# Patient Record
Sex: Female | Born: 1975 | Race: Black or African American | Hispanic: No | Marital: Married | Smoking: Never smoker
Health system: Southern US, Community
[De-identification: ages and names within clinical notes are randomized; demographics above are authoritative.]

## PROBLEM LIST (undated history)

## (undated) DIAGNOSIS — E785 Hyperlipidemia, unspecified: Secondary | ICD-10-CM

## (undated) DIAGNOSIS — I10 Essential (primary) hypertension: Secondary | ICD-10-CM

## (undated) DIAGNOSIS — M519 Unspecified thoracic, thoracolumbar and lumbosacral intervertebral disc disorder: Secondary | ICD-10-CM

## (undated) DIAGNOSIS — R55 Syncope and collapse: Secondary | ICD-10-CM

## (undated) DIAGNOSIS — M109 Gout, unspecified: Secondary | ICD-10-CM

## (undated) DIAGNOSIS — E669 Obesity, unspecified: Secondary | ICD-10-CM

## (undated) HISTORY — PX: APPENDECTOMY: SHX54

## (undated) HISTORY — DX: Unspecified thoracic, thoracolumbar and lumbosacral intervertebral disc disorder: M51.9

## (undated) HISTORY — PX: CHOLECYSTECTOMY: SHX55

## (undated) HISTORY — DX: Hyperlipidemia, unspecified: E78.5

## (undated) HISTORY — DX: Essential (primary) hypertension: I10

## (undated) HISTORY — DX: Obesity, unspecified: E66.9

## (undated) HISTORY — DX: Syncope and collapse: R55

---

## 2001-12-30 ENCOUNTER — Encounter: Payer: Self-pay | Admitting: Family Medicine

## 2001-12-30 ENCOUNTER — Ambulatory Visit (HOSPITAL_COMMUNITY): Admission: RE | Admit: 2001-12-30 | Discharge: 2001-12-30 | Payer: Self-pay | Admitting: Family Medicine

## 2002-01-07 ENCOUNTER — Observation Stay (HOSPITAL_COMMUNITY): Admission: EM | Admit: 2002-01-07 | Discharge: 2002-01-07 | Payer: Self-pay | Admitting: Emergency Medicine

## 2002-01-07 ENCOUNTER — Encounter: Payer: Self-pay | Admitting: Internal Medicine

## 2002-01-14 ENCOUNTER — Other Ambulatory Visit: Admission: RE | Admit: 2002-01-14 | Discharge: 2002-01-14 | Payer: Self-pay | Admitting: Internal Medicine

## 2002-10-27 ENCOUNTER — Encounter: Payer: Self-pay | Admitting: *Deleted

## 2002-10-27 ENCOUNTER — Emergency Department (HOSPITAL_COMMUNITY): Admission: EM | Admit: 2002-10-27 | Discharge: 2002-10-27 | Payer: Self-pay | Admitting: *Deleted

## 2002-12-08 ENCOUNTER — Encounter: Payer: Self-pay | Admitting: Family Medicine

## 2002-12-08 ENCOUNTER — Ambulatory Visit (HOSPITAL_COMMUNITY): Admission: RE | Admit: 2002-12-08 | Discharge: 2002-12-08 | Payer: Self-pay | Admitting: Family Medicine

## 2003-04-25 ENCOUNTER — Ambulatory Visit (HOSPITAL_COMMUNITY): Admission: RE | Admit: 2003-04-25 | Discharge: 2003-04-25 | Payer: Self-pay | Admitting: Family Medicine

## 2003-04-25 ENCOUNTER — Encounter: Payer: Self-pay | Admitting: Family Medicine

## 2003-06-29 ENCOUNTER — Emergency Department (HOSPITAL_COMMUNITY): Admission: EM | Admit: 2003-06-29 | Discharge: 2003-06-29 | Payer: Self-pay | Admitting: Emergency Medicine

## 2003-08-07 ENCOUNTER — Emergency Department (HOSPITAL_COMMUNITY): Admission: EM | Admit: 2003-08-07 | Discharge: 2003-08-07 | Payer: Self-pay | Admitting: Emergency Medicine

## 2003-10-30 ENCOUNTER — Emergency Department (HOSPITAL_COMMUNITY): Admission: EM | Admit: 2003-10-30 | Discharge: 2003-10-30 | Payer: Self-pay | Admitting: *Deleted

## 2004-05-17 ENCOUNTER — Ambulatory Visit (HOSPITAL_COMMUNITY): Admission: AD | Admit: 2004-05-17 | Discharge: 2004-05-17 | Payer: Self-pay | Admitting: Obstetrics & Gynecology

## 2004-05-21 ENCOUNTER — Ambulatory Visit (HOSPITAL_COMMUNITY): Admission: RE | Admit: 2004-05-21 | Discharge: 2004-05-21 | Payer: Self-pay | Admitting: Obstetrics & Gynecology

## 2004-05-21 ENCOUNTER — Ambulatory Visit (HOSPITAL_COMMUNITY): Admission: AD | Admit: 2004-05-21 | Discharge: 2004-05-21 | Payer: Self-pay | Admitting: Obstetrics & Gynecology

## 2004-05-24 ENCOUNTER — Ambulatory Visit (HOSPITAL_COMMUNITY): Admission: AD | Admit: 2004-05-24 | Discharge: 2004-05-24 | Payer: Self-pay | Admitting: Obstetrics & Gynecology

## 2004-05-28 ENCOUNTER — Ambulatory Visit (HOSPITAL_COMMUNITY): Admission: AD | Admit: 2004-05-28 | Discharge: 2004-05-28 | Payer: Self-pay | Admitting: Obstetrics & Gynecology

## 2004-05-31 ENCOUNTER — Ambulatory Visit (HOSPITAL_COMMUNITY): Admission: AD | Admit: 2004-05-31 | Discharge: 2004-05-31 | Payer: Self-pay | Admitting: Obstetrics & Gynecology

## 2004-06-04 ENCOUNTER — Ambulatory Visit (HOSPITAL_COMMUNITY): Admission: AD | Admit: 2004-06-04 | Discharge: 2004-06-04 | Payer: Self-pay | Admitting: Obstetrics & Gynecology

## 2004-06-07 ENCOUNTER — Ambulatory Visit (HOSPITAL_COMMUNITY): Admission: AD | Admit: 2004-06-07 | Discharge: 2004-06-07 | Payer: Self-pay | Admitting: Obstetrics & Gynecology

## 2004-06-11 ENCOUNTER — Inpatient Hospital Stay (HOSPITAL_COMMUNITY): Admission: AD | Admit: 2004-06-11 | Discharge: 2004-06-15 | Payer: Self-pay | Admitting: Obstetrics & Gynecology

## 2004-11-05 ENCOUNTER — Ambulatory Visit: Payer: Self-pay | Admitting: Family Medicine

## 2005-01-09 ENCOUNTER — Ambulatory Visit: Payer: Self-pay | Admitting: Family Medicine

## 2005-10-17 ENCOUNTER — Inpatient Hospital Stay (HOSPITAL_COMMUNITY): Admission: RE | Admit: 2005-10-17 | Discharge: 2005-10-20 | Payer: Self-pay | Admitting: Obstetrics and Gynecology

## 2006-01-28 ENCOUNTER — Ambulatory Visit: Payer: Self-pay | Admitting: Family Medicine

## 2006-03-19 ENCOUNTER — Ambulatory Visit: Payer: Self-pay | Admitting: Family Medicine

## 2006-05-01 ENCOUNTER — Ambulatory Visit: Payer: Self-pay | Admitting: Family Medicine

## 2006-09-25 ENCOUNTER — Ambulatory Visit: Payer: Self-pay | Admitting: Family Medicine

## 2006-10-27 ENCOUNTER — Other Ambulatory Visit: Admission: RE | Admit: 2006-10-27 | Discharge: 2006-10-27 | Payer: Self-pay | Admitting: Family Medicine

## 2006-10-27 ENCOUNTER — Ambulatory Visit: Payer: Self-pay | Admitting: Family Medicine

## 2006-10-27 ENCOUNTER — Encounter: Payer: Self-pay | Admitting: Family Medicine

## 2006-10-27 LAB — CONVERTED CEMR LAB: Pap Smear: NORMAL

## 2006-10-29 ENCOUNTER — Encounter: Payer: Self-pay | Admitting: Internal Medicine

## 2007-04-09 ENCOUNTER — Ambulatory Visit: Payer: Self-pay | Admitting: Family Medicine

## 2007-04-10 ENCOUNTER — Encounter: Payer: Self-pay | Admitting: Family Medicine

## 2007-04-17 ENCOUNTER — Encounter: Payer: Self-pay | Admitting: Family Medicine

## 2007-04-17 LAB — CONVERTED CEMR LAB
BUN: 15 mg/dL (ref 6–23)
Basophils Absolute: 0 10*3/uL (ref 0.0–0.1)
CO2: 24 meq/L (ref 19–32)
Calcium: 9.5 mg/dL (ref 8.4–10.5)
Chloride: 101 meq/L (ref 96–112)
Cholesterol: 226 mg/dL — ABNORMAL HIGH (ref 0–200)
Creatinine, Ser: 0.88 mg/dL (ref 0.40–1.20)
Eosinophils Absolute: 0.2 10*3/uL (ref 0.0–0.7)
Eosinophils Relative: 2 % (ref 0–5)
Hemoglobin: 13.3 g/dL (ref 12.0–15.0)
Lymphocytes Relative: 40 % (ref 12–46)
MCHC: 31.8 g/dL (ref 30.0–36.0)
Monocytes Absolute: 0.6 10*3/uL (ref 0.2–0.7)
Neutro Abs: 4.2 10*3/uL (ref 1.7–7.7)
Neutrophils Relative %: 51 % (ref 43–77)
RDW: 13.5 % (ref 11.5–14.0)
TSH: 1.537 microintl units/mL (ref 0.350–5.50)
Total CHOL/HDL Ratio: 5.4
Triglycerides: 104 mg/dL (ref <150)
WBC: 8.2 10*3/uL (ref 4.0–10.5)

## 2007-05-05 ENCOUNTER — Emergency Department (HOSPITAL_COMMUNITY): Admission: EM | Admit: 2007-05-05 | Discharge: 2007-05-05 | Payer: Self-pay | Admitting: Emergency Medicine

## 2007-05-08 ENCOUNTER — Emergency Department (HOSPITAL_COMMUNITY): Admission: EM | Admit: 2007-05-08 | Discharge: 2007-05-08 | Payer: Self-pay | Admitting: Emergency Medicine

## 2007-05-11 ENCOUNTER — Ambulatory Visit: Payer: Self-pay | Admitting: Family Medicine

## 2007-05-12 ENCOUNTER — Ambulatory Visit: Payer: Self-pay | Admitting: Cardiovascular Disease

## 2007-05-26 ENCOUNTER — Ambulatory Visit (HOSPITAL_COMMUNITY): Admission: RE | Admit: 2007-05-26 | Discharge: 2007-05-26 | Payer: Self-pay | Admitting: Cardiovascular Disease

## 2007-05-26 ENCOUNTER — Ambulatory Visit: Payer: Self-pay | Admitting: Cardiology

## 2007-07-07 ENCOUNTER — Ambulatory Visit: Payer: Self-pay | Admitting: Family Medicine

## 2007-07-08 ENCOUNTER — Ambulatory Visit: Payer: Self-pay | Admitting: Cardiovascular Disease

## 2007-08-07 ENCOUNTER — Emergency Department (HOSPITAL_COMMUNITY): Admission: EM | Admit: 2007-08-07 | Discharge: 2007-08-07 | Payer: Self-pay | Admitting: Emergency Medicine

## 2007-08-25 ENCOUNTER — Ambulatory Visit: Payer: Self-pay | Admitting: Family Medicine

## 2007-09-07 ENCOUNTER — Encounter (HOSPITAL_COMMUNITY): Admission: RE | Admit: 2007-09-07 | Discharge: 2007-10-07 | Payer: Self-pay | Admitting: Family Medicine

## 2007-09-28 ENCOUNTER — Encounter: Payer: Self-pay | Admitting: Family Medicine

## 2007-09-28 LAB — CONVERTED CEMR LAB
CO2: 29 meq/L (ref 19–32)
Calcium: 9.7 mg/dL (ref 8.4–10.5)
Glucose, Bld: 98 mg/dL (ref 70–99)

## 2007-09-30 ENCOUNTER — Encounter: Admission: RE | Admit: 2007-09-30 | Discharge: 2007-09-30 | Payer: Self-pay | Admitting: General Surgery

## 2007-11-13 ENCOUNTER — Encounter (INDEPENDENT_AMBULATORY_CARE_PROVIDER_SITE_OTHER): Payer: Self-pay | Admitting: General Surgery

## 2007-11-13 ENCOUNTER — Ambulatory Visit (HOSPITAL_COMMUNITY): Admission: RE | Admit: 2007-11-13 | Discharge: 2007-11-13 | Payer: Self-pay | Admitting: General Surgery

## 2007-12-02 ENCOUNTER — Ambulatory Visit: Payer: Self-pay | Admitting: Cardiovascular Disease

## 2007-12-16 ENCOUNTER — Ambulatory Visit (HOSPITAL_COMMUNITY): Admission: RE | Admit: 2007-12-16 | Discharge: 2007-12-16 | Payer: Self-pay | Admitting: Family Medicine

## 2007-12-16 ENCOUNTER — Ambulatory Visit: Payer: Self-pay | Admitting: Family Medicine

## 2008-04-14 ENCOUNTER — Ambulatory Visit: Payer: Self-pay | Admitting: Family Medicine

## 2008-04-20 ENCOUNTER — Encounter: Payer: Self-pay | Admitting: Family Medicine

## 2008-04-20 DIAGNOSIS — E785 Hyperlipidemia, unspecified: Secondary | ICD-10-CM

## 2008-04-20 DIAGNOSIS — J301 Allergic rhinitis due to pollen: Secondary | ICD-10-CM

## 2008-04-20 DIAGNOSIS — E669 Obesity, unspecified: Secondary | ICD-10-CM

## 2008-04-20 DIAGNOSIS — I10 Essential (primary) hypertension: Secondary | ICD-10-CM

## 2008-07-27 ENCOUNTER — Ambulatory Visit: Payer: Self-pay | Admitting: Family Medicine

## 2008-07-27 DIAGNOSIS — R5381 Other malaise: Secondary | ICD-10-CM | POA: Insufficient documentation

## 2008-07-27 DIAGNOSIS — R5383 Other fatigue: Secondary | ICD-10-CM

## 2008-08-05 ENCOUNTER — Encounter: Admission: RE | Admit: 2008-08-05 | Discharge: 2008-08-05 | Payer: Self-pay | Admitting: Family Medicine

## 2008-09-02 ENCOUNTER — Encounter: Payer: Self-pay | Admitting: Family Medicine

## 2008-10-26 ENCOUNTER — Ambulatory Visit: Payer: Self-pay | Admitting: Family Medicine

## 2008-10-27 ENCOUNTER — Encounter: Payer: Self-pay | Admitting: Family Medicine

## 2008-10-30 IMAGING — US US ABDOMEN COMPLETE
1 series · 13 of 25 positions shown · non-contrast
Comparison: none

CLINICAL DATA: 31 year-old with right upper quadrant abdominal pain.  Nausea and vomiting.
 ABDOMEN ULTRASOUND:
TECHNIQUE: Complete abdominal ultrasound examination was performed including evaluation of the liver, gallbladder, bile ducts, pancreas, kidneys, spleen, IVC, and abdominal aorta.

[Series 1: unknown · 0.34mm/px · 13 of 56 slices shown]
[im 1/56]
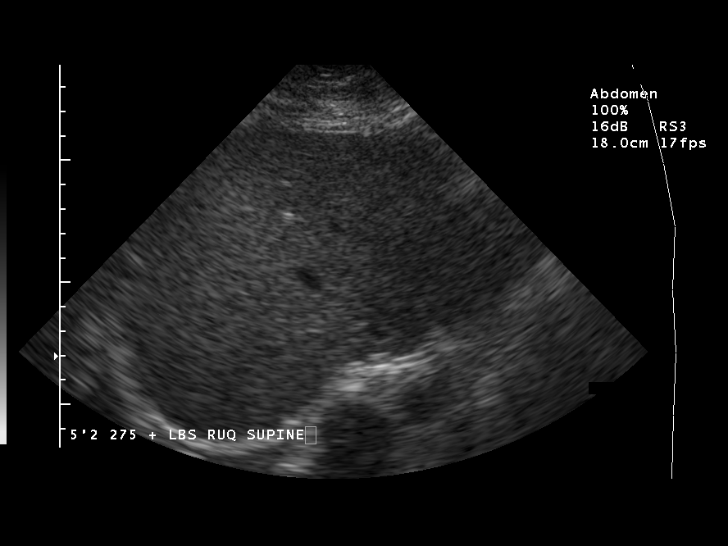
[im 5/56]
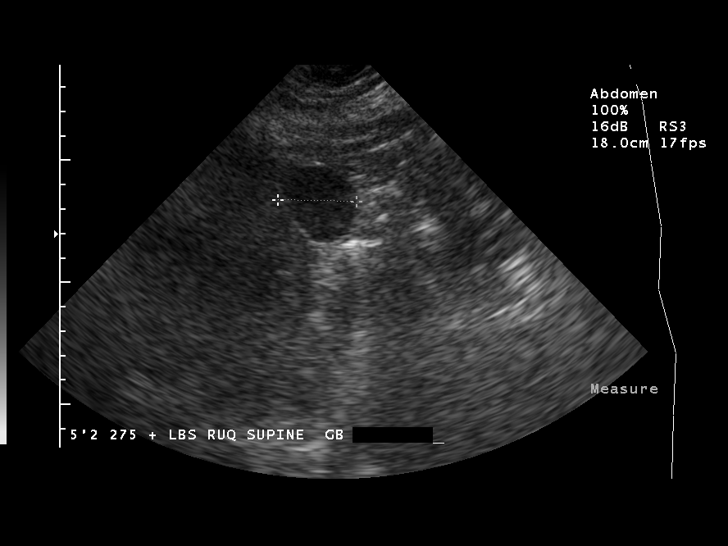
[im 10/56]
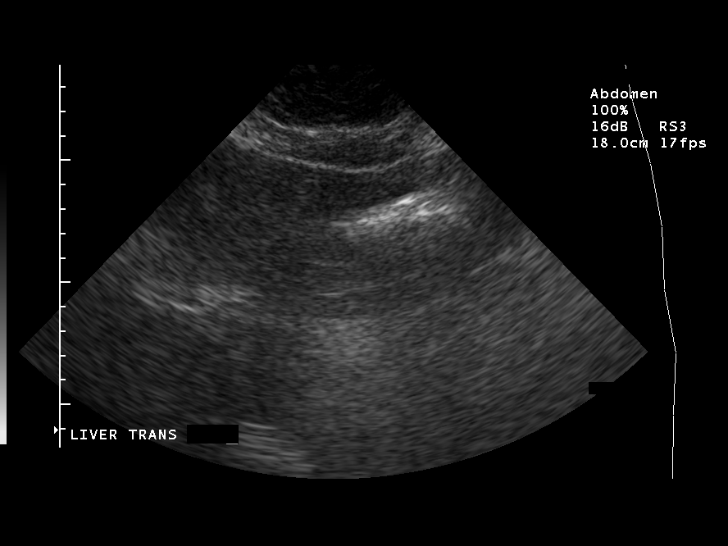
[im 14/56]
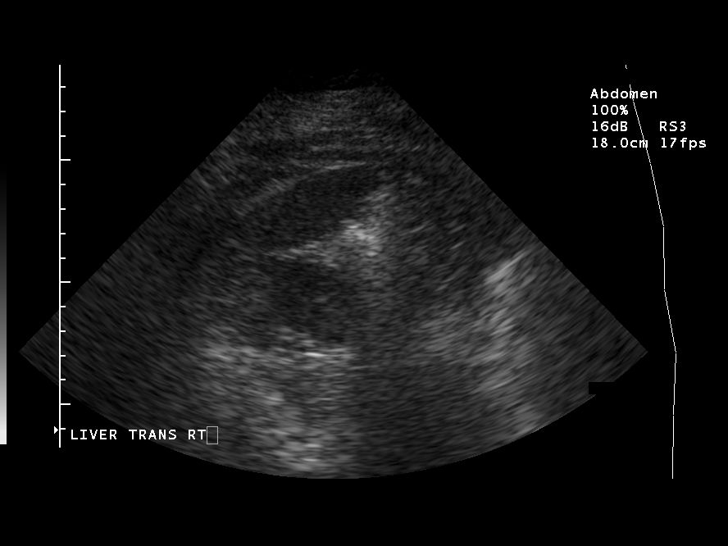
[im 19/56]
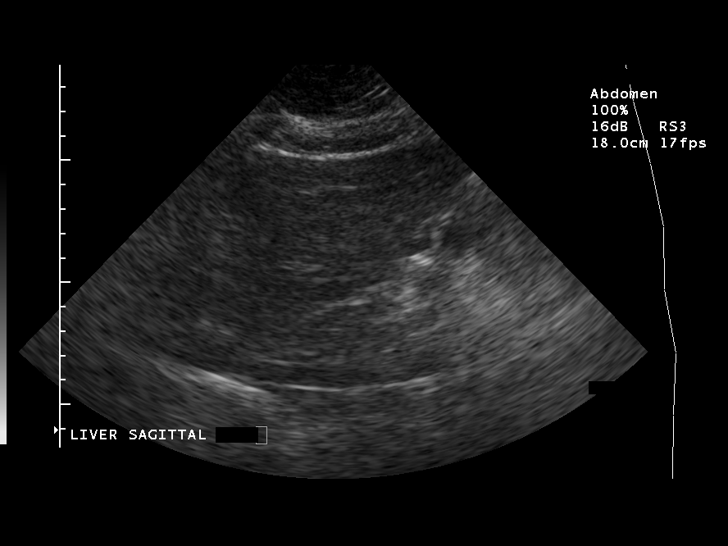
[im 23/56]
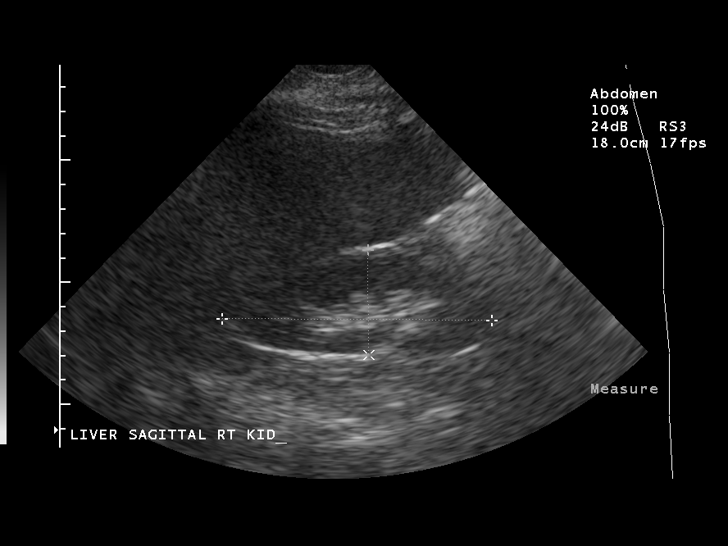
[im 28/56]
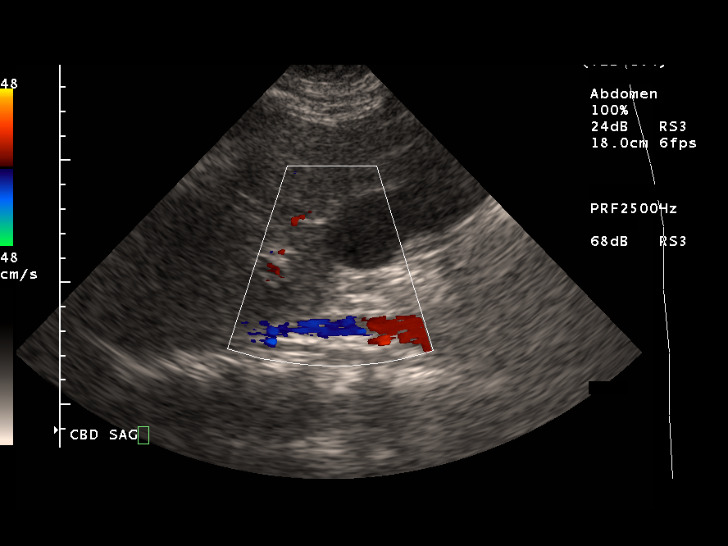
[im 33/56]
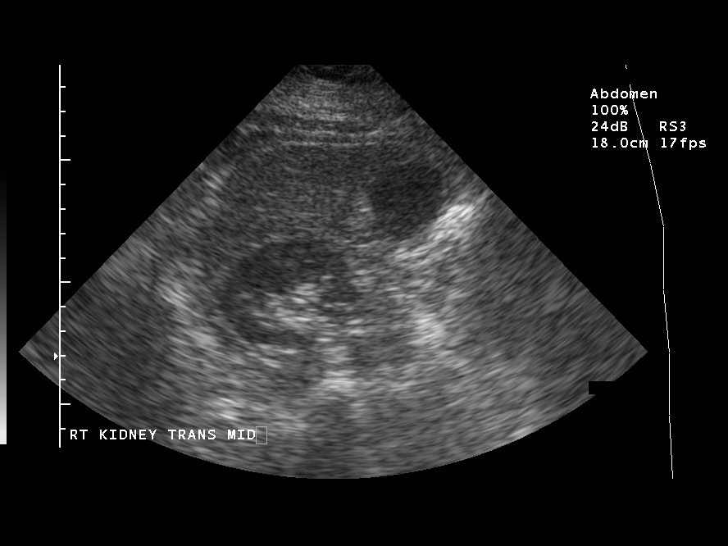
[im 37/56]
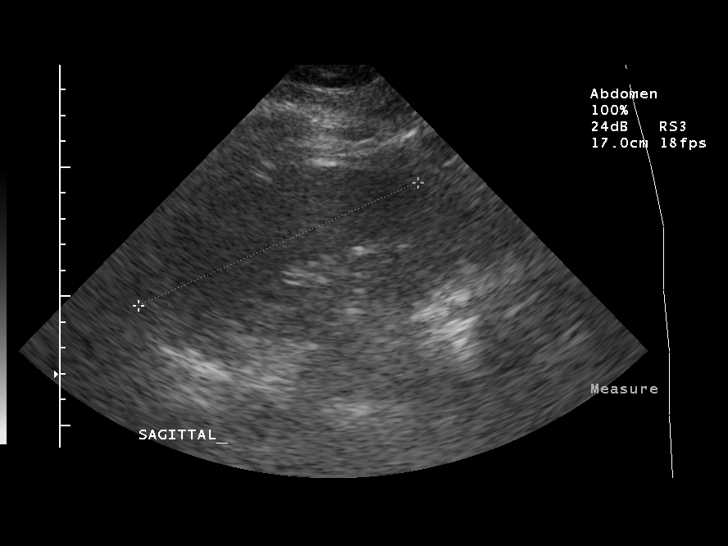
[im 42/56]
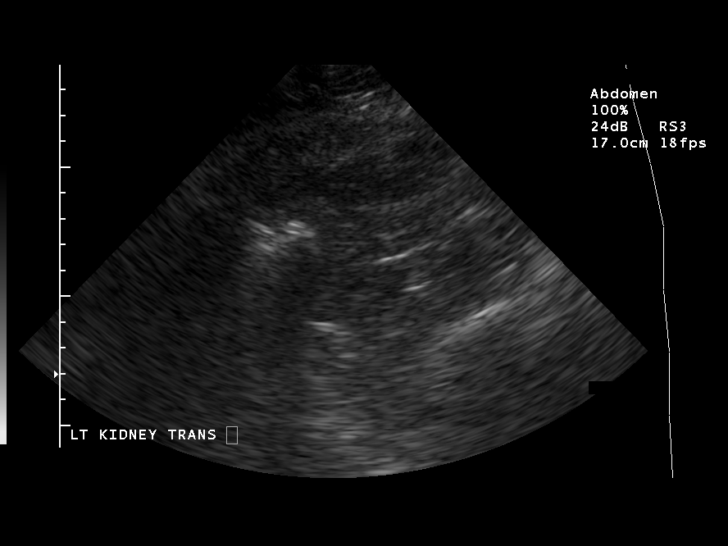
[im 46/56]
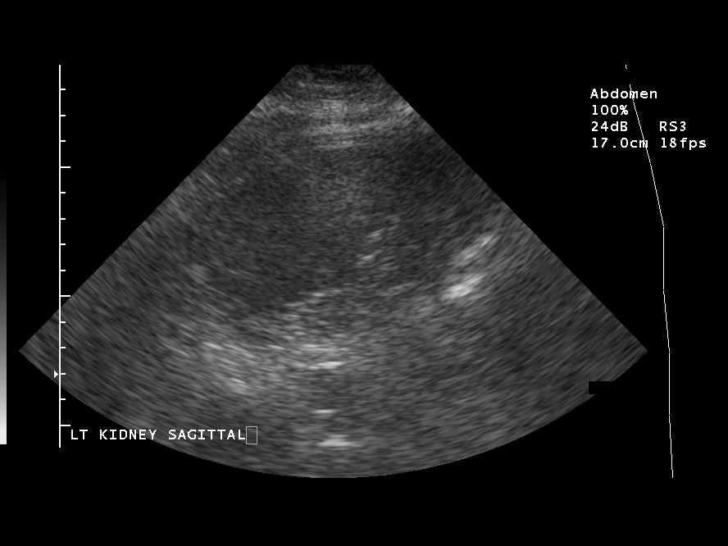
[im 51/56]
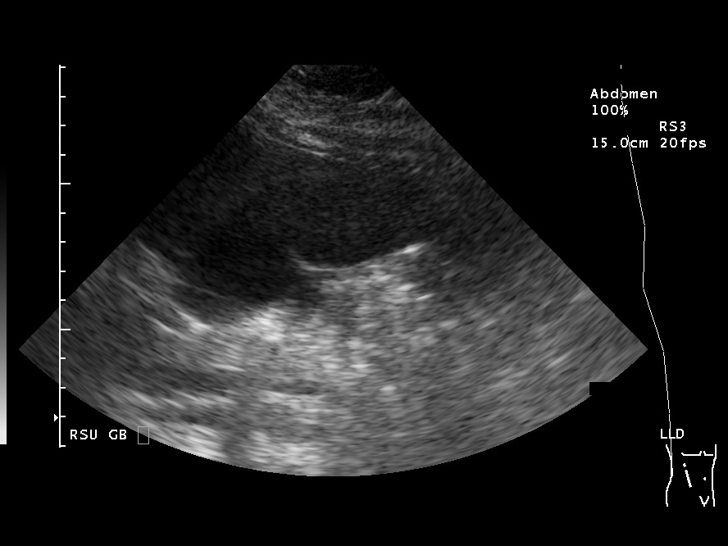
[im 56/56]
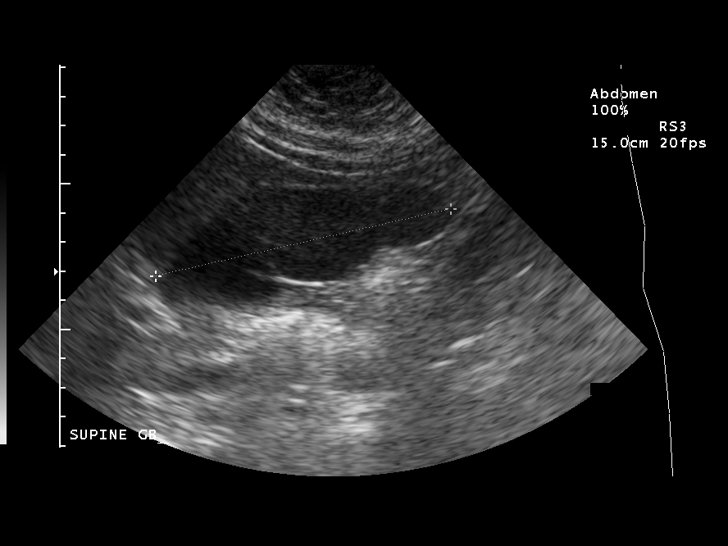

[13 of 25 positions shown; findings below may reference images not displayed]

FINDINGS: The liver is somewhat echogenic with poor definition of the liver architecture and decreased through-transmission suggesting fatty infiltration.  No focal hepatic lesions or intrahepatic ductal dilatation.  The common bile duct measures 3.4mm which is within normal limits.
 The gallbladder demonstrates no wall thickening or pericholecystic fluid.  No gallstones are seen.  
 The pancreas is not well visualized due to overlying bowel gas.  The IVC and aorta are normal in caliber.  The aorta is incompletely visualized.  
 The spleen is normal in size and demonstrates normal echogenicity.  No focal lesions.
 The right kidney measures 11.0cm and the left kidney measures 12.0cm.  Both kidneys demonstrate normal echogenicity with normal renal cortical thickness and no hydronephrosis.
IMPRESSION: 1.  Mild diffuse fatty infiltration of the liver.
 2.  Unremarkable sonographic appearance of the gallbladder and normal-caliber common bile duct.
 3.  Poor visualization of the pancreas and parts of the aorta.

## 2008-11-03 LAB — CONVERTED CEMR LAB
BUN: 13 mg/dL (ref 6–23)
Basophils Absolute: 0 10*3/uL (ref 0.0–0.1)
CO2: 24 meq/L (ref 19–32)
Calcium: 9.7 mg/dL (ref 8.4–10.5)
Chloride: 104 meq/L (ref 96–112)
Cholesterol: 196 mg/dL (ref 0–200)
Creatinine, Ser: 0.86 mg/dL (ref 0.40–1.20)
Eosinophils Absolute: 0.1 10*3/uL (ref 0.0–0.7)
HCT: 43.8 % (ref 36.0–46.0)
HDL: 44 mg/dL (ref >39)
Monocytes Absolute: 0.5 10*3/uL (ref 0.1–1.0)
Platelets: 178 10*3/uL (ref 150–400)
Potassium: 4.1 meq/L (ref 3.5–5.3)
RBC: 4.95 M/uL (ref 3.87–5.11)
RDW: 14.1 % (ref 11.5–15.5)
Sodium: 141 meq/L (ref 135–145)
Total CHOL/HDL Ratio: 4.5
Triglycerides: 55 mg/dL (ref <150)
WBC: 8.3 10*3/uL (ref 4.0–10.5)

## 2008-12-03 ENCOUNTER — Telehealth: Payer: Self-pay | Admitting: Family Medicine

## 2009-02-02 IMAGING — CR DG CHEST 2V
2 series · 2 of 2 positions shown · non-contrast
Comparison: 05/08/07.

CLINICAL DATA: Preop for biliary dyskinesia. 
 CHEST ? 2 VIEW:

[w chest pa]
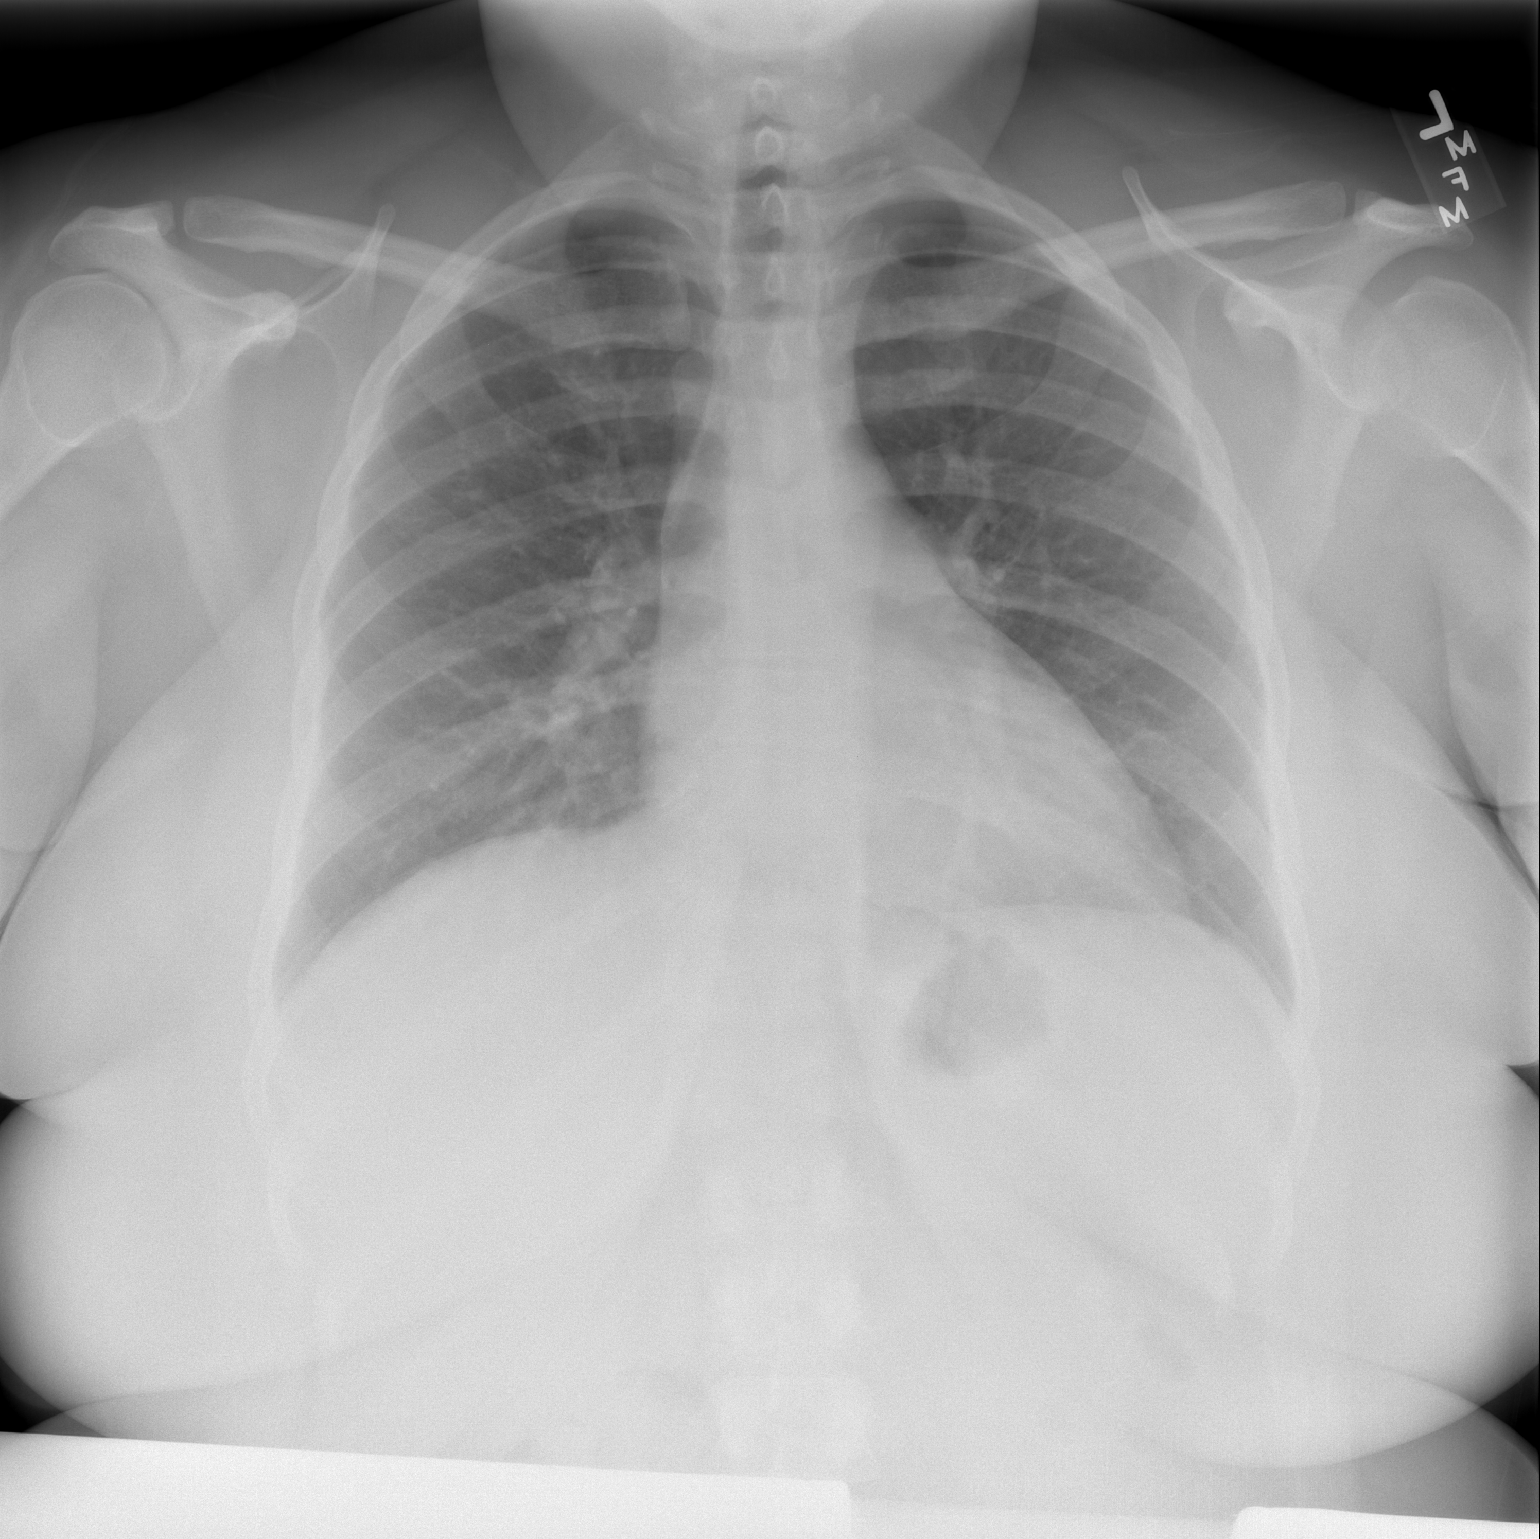

[w chest lat]
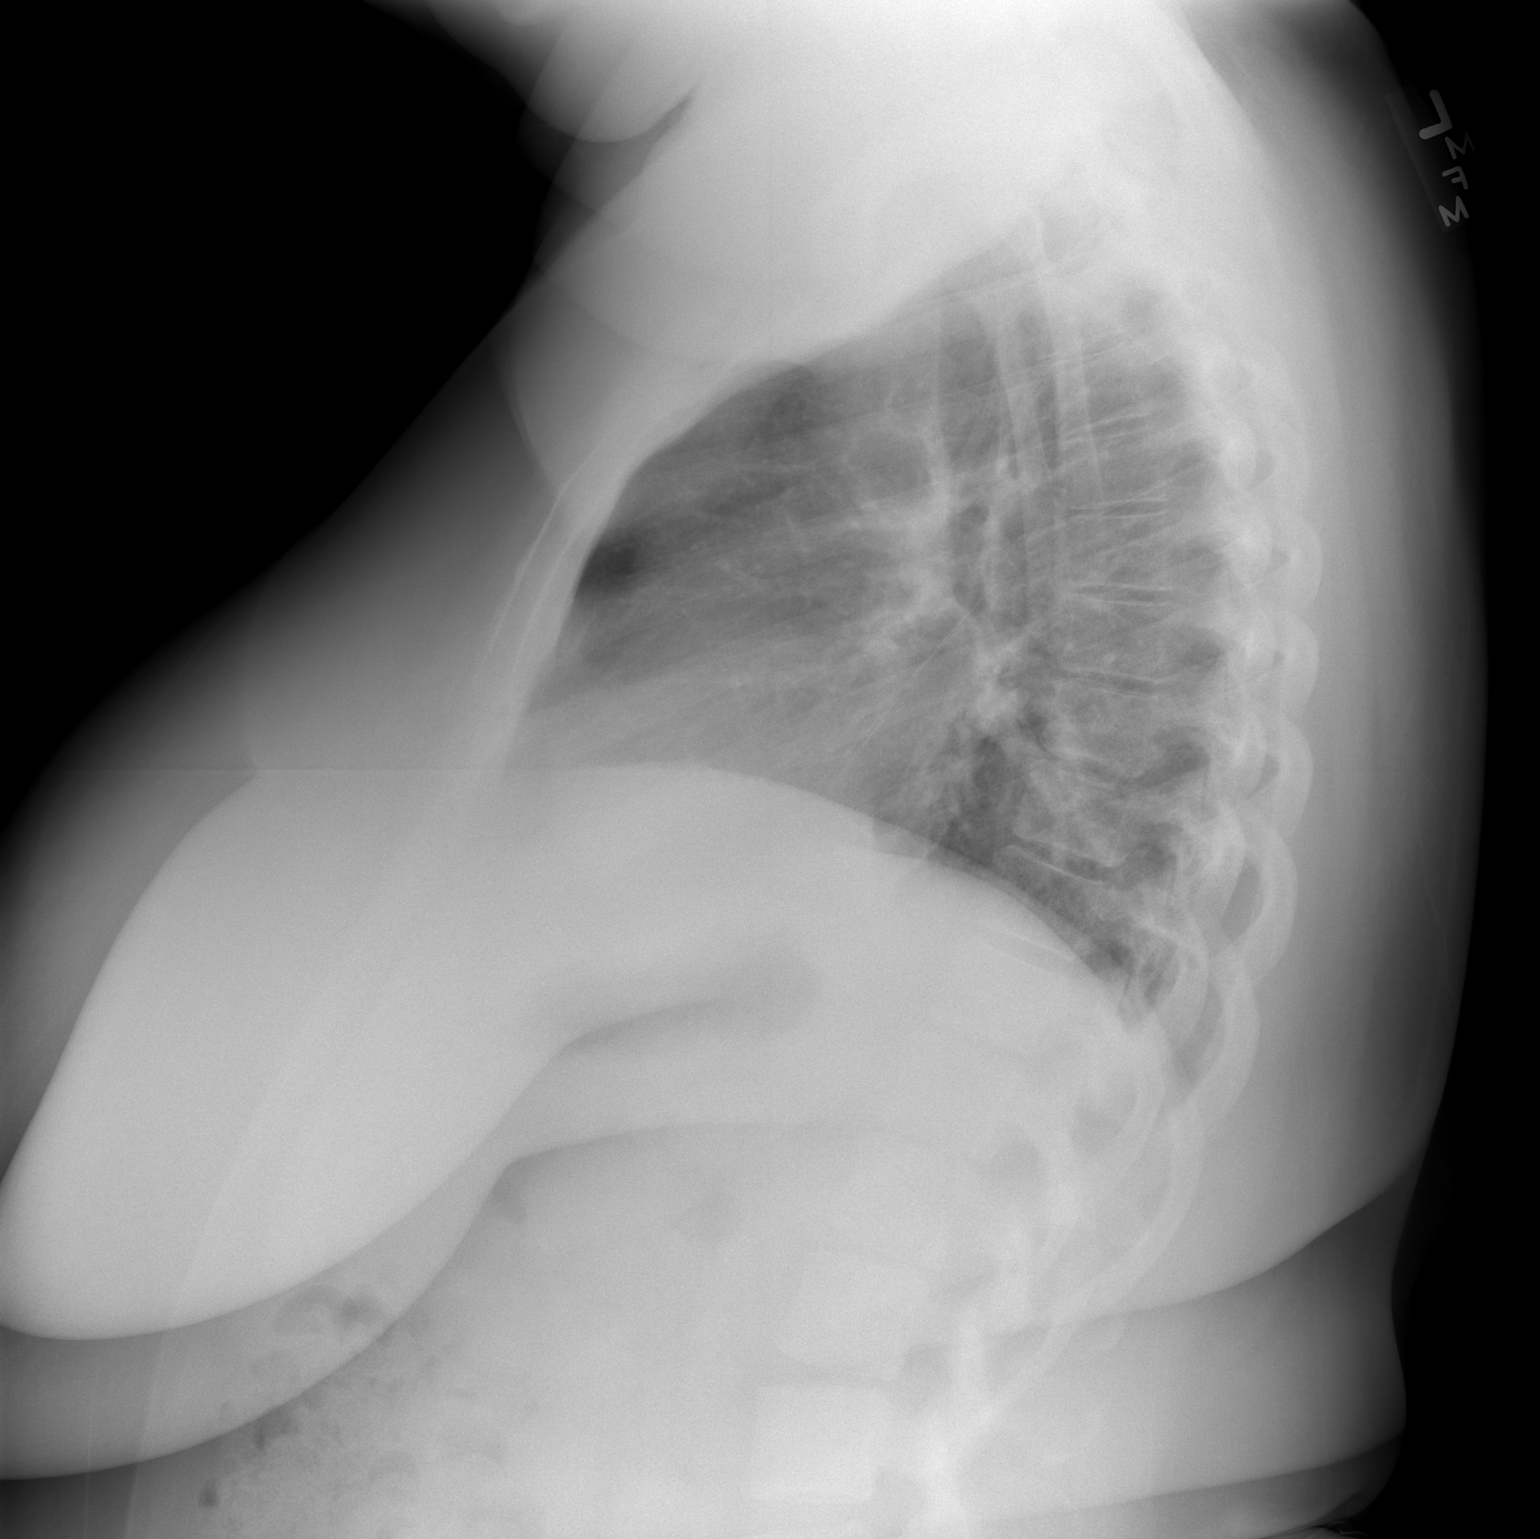

[2 of 2 positions shown; findings below may reference images not displayed]

FINDINGS: Heart size within normal limits.   Lungs clear.  No current congestive heart failure.  Bony structures intact.
IMPRESSION: No active disease.

## 2009-03-06 ENCOUNTER — Ambulatory Visit: Payer: Self-pay | Admitting: Family Medicine

## 2009-04-10 ENCOUNTER — Encounter: Payer: Self-pay | Admitting: Family Medicine

## 2009-04-10 ENCOUNTER — Emergency Department (HOSPITAL_COMMUNITY): Admission: EM | Admit: 2009-04-10 | Discharge: 2009-04-10 | Payer: Self-pay | Admitting: Emergency Medicine

## 2009-04-19 ENCOUNTER — Ambulatory Visit: Payer: Self-pay | Admitting: Internal Medicine

## 2009-04-19 DIAGNOSIS — G4733 Obstructive sleep apnea (adult) (pediatric): Secondary | ICD-10-CM

## 2009-04-22 DIAGNOSIS — J309 Allergic rhinitis, unspecified: Secondary | ICD-10-CM | POA: Insufficient documentation

## 2009-05-04 ENCOUNTER — Ambulatory Visit: Payer: Self-pay | Admitting: Family Medicine

## 2009-07-27 ENCOUNTER — Ambulatory Visit: Payer: Self-pay | Admitting: Family Medicine

## 2009-08-30 ENCOUNTER — Ambulatory Visit: Payer: Self-pay | Admitting: Family Medicine

## 2009-08-30 DIAGNOSIS — J019 Acute sinusitis, unspecified: Secondary | ICD-10-CM

## 2009-08-30 DIAGNOSIS — J329 Chronic sinusitis, unspecified: Secondary | ICD-10-CM | POA: Insufficient documentation

## 2009-08-31 ENCOUNTER — Telehealth: Payer: Self-pay | Admitting: Family Medicine

## 2009-08-31 LAB — CONVERTED CEMR LAB
BUN: 11 mg/dL (ref 6–23)
Basophils Relative: 0 % (ref 0–1)
Calcium: 9.4 mg/dL (ref 8.4–10.5)
Chloride: 103 meq/L (ref 96–112)
Cholesterol: 223 mg/dL — ABNORMAL HIGH (ref 0–200)
Eosinophils Absolute: 0.2 10*3/uL (ref 0.0–0.7)
Eosinophils Relative: 2 % (ref 0–5)
HDL: 43 mg/dL (ref >39)
Lymphocytes Relative: 32 % (ref 12–46)
Lymphs Abs: 2.5 10*3/uL (ref 0.7–4.0)
Potassium: 4.2 meq/L (ref 3.5–5.3)
RDW: 13.5 % (ref 11.5–15.5)
Sodium: 141 meq/L (ref 135–145)
Total CHOL/HDL Ratio: 5.2
WBC: 7.6 10*3/uL (ref 4.0–10.5)

## 2009-11-02 ENCOUNTER — Ambulatory Visit: Payer: Self-pay | Admitting: Family Medicine

## 2009-11-02 DIAGNOSIS — J209 Acute bronchitis, unspecified: Secondary | ICD-10-CM

## 2009-11-02 DIAGNOSIS — J329 Chronic sinusitis, unspecified: Secondary | ICD-10-CM | POA: Insufficient documentation

## 2009-11-08 ENCOUNTER — Telehealth: Payer: Self-pay | Admitting: Family Medicine

## 2009-11-17 ENCOUNTER — Encounter: Payer: Self-pay | Admitting: Family Medicine

## 2009-12-07 ENCOUNTER — Encounter (INDEPENDENT_AMBULATORY_CARE_PROVIDER_SITE_OTHER): Payer: Self-pay | Admitting: *Deleted

## 2010-02-08 ENCOUNTER — Encounter: Payer: Self-pay | Admitting: Family Medicine

## 2010-02-15 ENCOUNTER — Ambulatory Visit (HOSPITAL_COMMUNITY): Admission: RE | Admit: 2010-02-15 | Discharge: 2010-02-15 | Payer: Self-pay | Admitting: General Surgery

## 2010-04-04 ENCOUNTER — Encounter: Admission: RE | Admit: 2010-04-04 | Discharge: 2010-04-04 | Payer: Self-pay | Admitting: General Surgery

## 2010-04-12 ENCOUNTER — Encounter: Payer: Self-pay | Admitting: Family Medicine

## 2010-05-29 ENCOUNTER — Telehealth (INDEPENDENT_AMBULATORY_CARE_PROVIDER_SITE_OTHER): Payer: Self-pay | Admitting: *Deleted

## 2010-07-04 IMAGING — CR DG CHEST 2V
2 series · 2 of 2 positions shown · non-contrast
Comparison: 12/16/2007

CLINICAL DATA: Chest pain and shortness of breath.

CHEST - 2 VIEW

[view not recorded (1 of 2)]
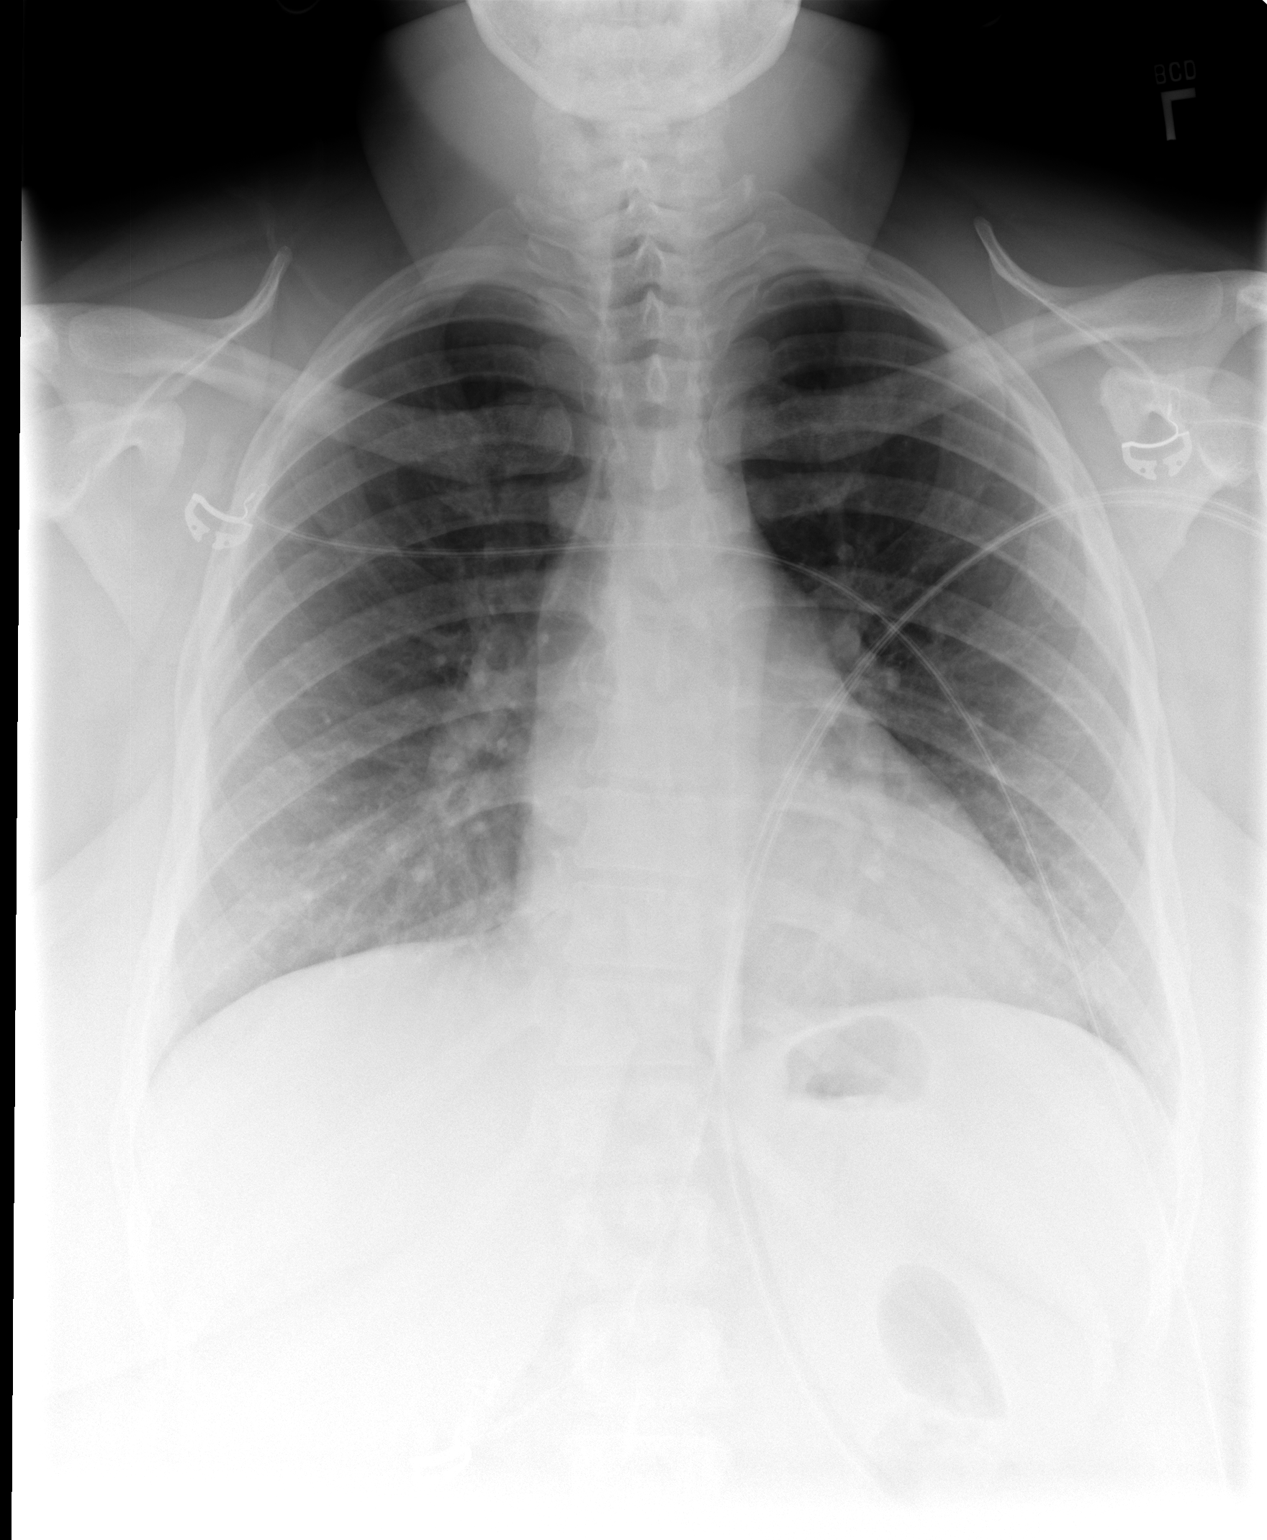

[view not recorded (2 of 2)]
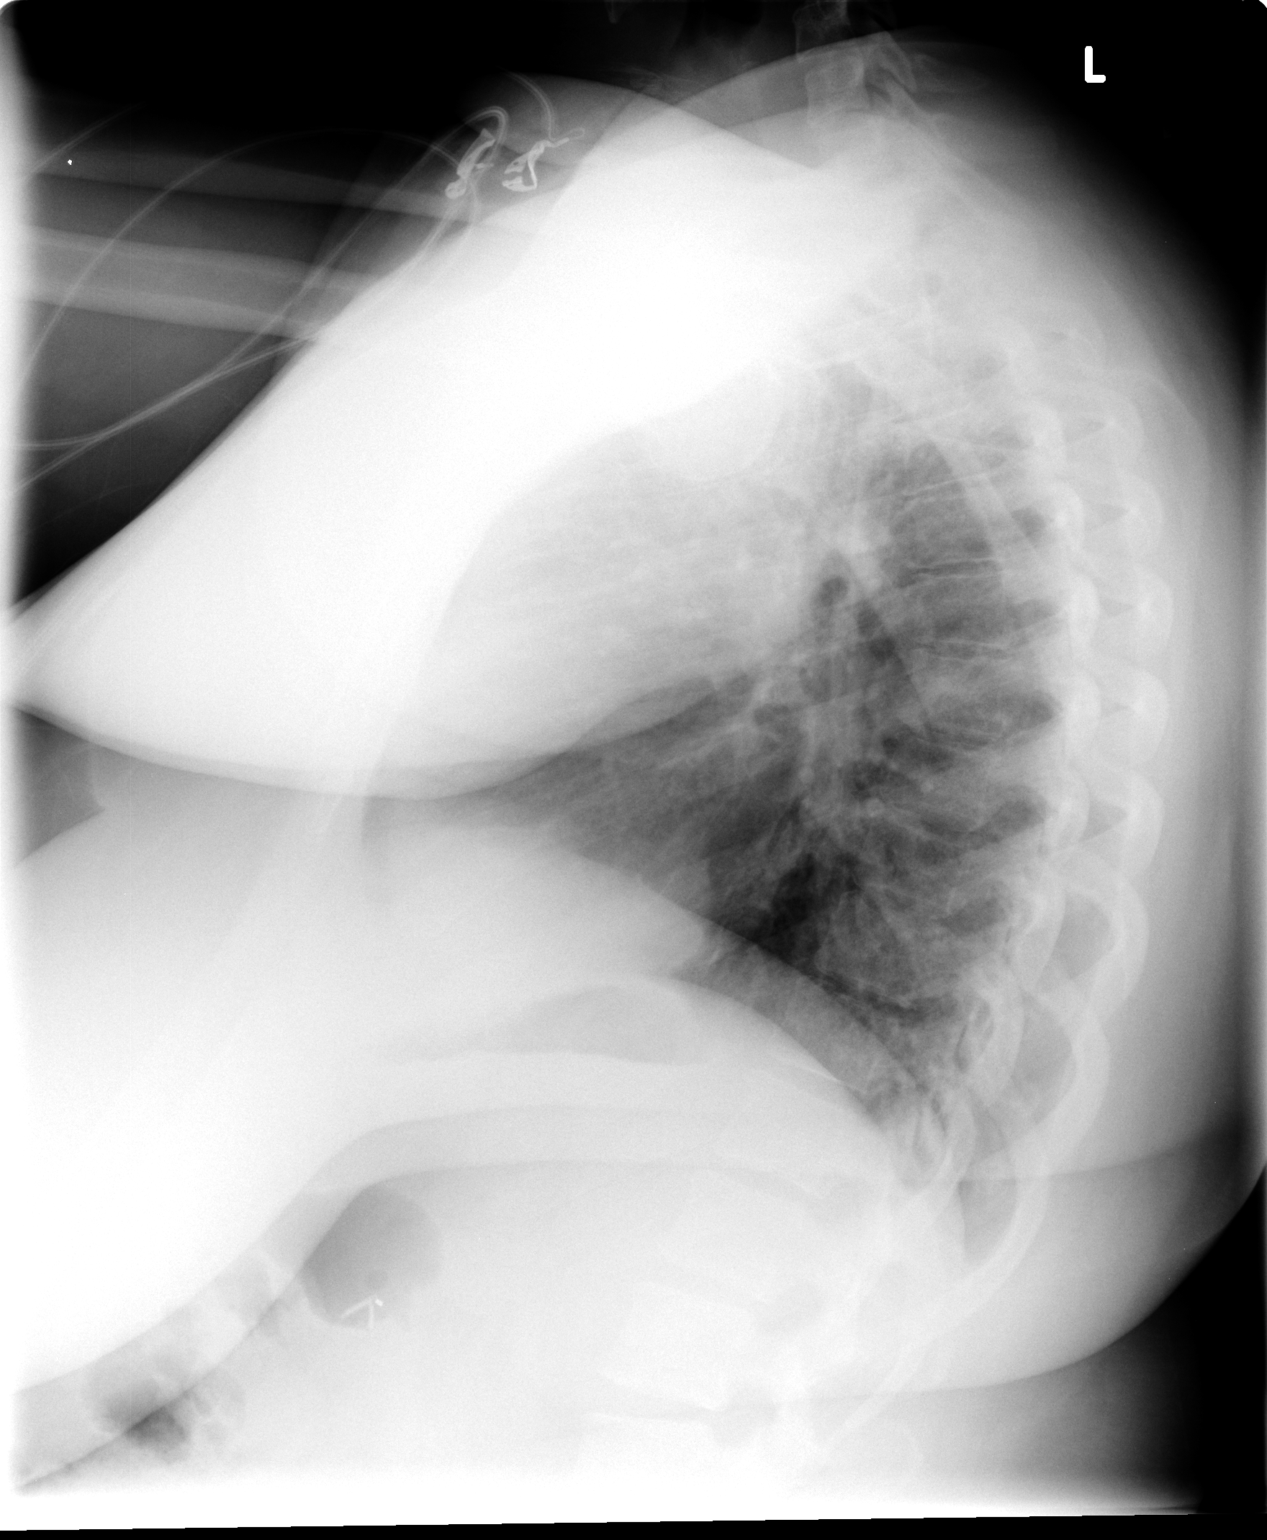

[2 of 2 positions shown; findings below may reference images not displayed]

FINDINGS: The cardiac silhouette, mediastinal and hilar contours
are within normal limits and stable.  The lungs are clear.  The
bony thorax is intact.
IMPRESSION: No acute cardiopulmonary findings.

## 2010-07-05 ENCOUNTER — Encounter
Admission: RE | Admit: 2010-07-05 | Discharge: 2010-07-27 | Payer: Self-pay | Source: Home / Self Care | Admitting: General Surgery

## 2010-08-18 HISTORY — PX: LAPAROSCOPIC GASTRIC BANDING: SHX1100

## 2010-08-21 ENCOUNTER — Ambulatory Visit (HOSPITAL_COMMUNITY): Admission: RE | Admit: 2010-08-21 | Discharge: 2010-08-22 | Payer: Self-pay | Admitting: General Surgery

## 2010-09-04 ENCOUNTER — Encounter
Admission: RE | Admit: 2010-09-04 | Discharge: 2010-11-27 | Payer: Self-pay | Source: Home / Self Care | Attending: General Surgery | Admitting: General Surgery

## 2010-09-13 ENCOUNTER — Encounter: Payer: Self-pay | Admitting: Family Medicine

## 2010-10-05 ENCOUNTER — Ambulatory Visit: Payer: Self-pay | Admitting: Family Medicine

## 2010-10-11 ENCOUNTER — Encounter
Admission: RE | Admit: 2010-10-11 | Discharge: 2010-11-27 | Payer: Self-pay | Source: Home / Self Care | Attending: General Surgery | Admitting: General Surgery

## 2010-11-18 ENCOUNTER — Encounter: Payer: Self-pay | Admitting: Family Medicine

## 2010-11-27 NOTE — Letter (Signed)
Summary: postop visit f up lap band  postop visit f up lap band   Imported By: Lind Guest 10/03/2010 11:08:15  _____________________________________________________________________  External Attachment:    Type:   Image     Comment:   External Document

## 2010-11-27 NOTE — Progress Notes (Signed)
Summary: dr. Johna Sheriff  dr. hoxworth   Imported By: Lind Guest 03/13/2010 15:29:45  _____________________________________________________________________  External Attachment:    Type:   Image     Comment:   External Document

## 2010-11-27 NOTE — Progress Notes (Signed)
Summary: call back  Phone Note Call from Patient   Summary of Call: jessica from [facility] call her back at [phone] Initial call taken by: Lind Guest,  November 08, 2009 10:19 AM  Follow-up for Phone Call        got percert number for pt. it is [id number]. left message for pt that she has appt at [facility] for 0/13/2011 at 4:00.  Follow-up by: Rudene Anda,  November 09, 2009 10:35 AM

## 2010-11-27 NOTE — Letter (Signed)
Summary: 1st Missed Appt.  [facility]  [address]   [place], [place] [address]   Phone: [phone]  Fax: [phone]    December 07, 2009  MRN: [id number]  [facility] MOORE-LYONS [address] [place], [place]  [address]  Dear [name],  At [facility], we make every attempt to fit patients into our schedule by reserving several appointment slots for same-day appointments.  However, we cannot always make appointments for patients the same day they are calling.  At the end of the day, we look back at our schedule and find that because of last-minute cancellations and patients not showing up for their scheduled appointments, we have several appointment slots that are left open and could have been used by another person who really needed it.  In the past, you may have been one of the patients who could not get in when you needed to.  But recently, you were one of the patients with an appointment that you didn't show up for or canceled too late for [place] to fill it.  We choose not to charge no-show or last minute cancellation fees to our patients, like many other offices do.  We do not wish to institute that policy and hope we never have to.  However, we kindly request that you assist [place] by providing at least 24 hours' notice if you can't make your appointment.  If no-shows or late cancellations become habitual (i.e. Three or more in a one-year period), we may terminate the physician-patient relationship.    Thank you for your consideration and cooperation.   Altamease Oiler

## 2010-11-27 NOTE — Assessment & Plan Note (Signed)
Summary: ov   Vital Signs:  Patient profile:   35 year old female Menstrual status:  regular Height:      63.5 inches Weight:      272 pounds BMI:     47.60 O2 Sat:      98 % on Room air Pulse rate:   75 / minute Pulse rhythm:   regular Resp:     16 per minute BP sitting:   104 / 80  (left arm)  Vitals Entered By: Adella Hare LPN (October 05, 2010 8:20 AM)  Nutrition Counseling: Patient's BMI is greater than 25 and therefore counseled on weight management options.  O2 Flow:  Room air CC: head congestion, cough, thick sputum, yellow nasal drainage Is Patient Diabetic? No Pain Assessment Patient in pain? no        Primary Care Provider:  Syliva Overman  CC:  head congestion, cough, thick sputum, and yellow nasal drainage.  History of Present Illness: 1 week h/o head and chest congestion, posterio nasal drainage,  and anterior nasal drainage is yellow. Reports  that prior to this she had been doing fairlywell. She recentlyhad lap band , and has been doing well.  Denies chest pain, palpitations, PND, orthopnea or leg swelling. Denies abdominal pain, nausea, vomitting, diarrhea or constipation. Denies change in bowel movements or bloody stool. Denies dysuria , frequency, incontinence or hesitancy. Denies  joint pain, swelling, or reduced mobility. reports  headache,denies vertigo or seizures. Denies depression, anxiety or insomnia. Denies  rash, lesions, or itch.      Current Medications (verified): 1)  Amlodipine Besylate 10 Mg Tabs (Amlodipine Besylate) .... One Tab By Mouth Once Daily  Allergies (verified): 1)  ! Vicodin 2)  ! Vicodin  Past History:  Past medical, surgical, family and social histories (including risk factors) reviewed, and no changes noted (except as noted below).  Past Medical History: Reviewed history from 04/19/2009 and no changes required. hypertension obesity Allergic Rhinitis Lumbar disk disease  Past Surgical  History: appendectomy Cholecystectomy lap band October 22,2011  Family History: Reviewed history from 05/04/2009 and no changes required. mother, breast ca, diabetes, hypertension, obesity , hyperlipidemia fatehr..living brother x1 HTN  Social History: Reviewed history from 04/19/2009 and no changes required. teacher- high school biology Married Never Smoked Alcohol use-no Drug use-yes 2 children  Review of Systems      See HPI General:  Complains of chills, fatigue, and fever. Eyes:  Denies discharge, red eye, and vision loss-1 eye. Endo:  Denies cold intolerance, excessive hunger, excessive thirst, and excessive urination. Heme:  Denies abnormal bruising and bleeding. Allergy:  Denies hives or rash and itching eyes.  Physical Exam  General:  Well-developed,obese,in no acute distress; alert,appropriate and cooperative throughout examination HEENT: No facial asymmetry,  EOMI, maxillary  sinus tenderness, TM's Clear, oropharynx  pink and moist.   Chest:decreased air entry, bilateral crackles, no wheezes CVS: S1, S2, No murmurs, No S3.   Abd: Soft, Nontender.  MS: Adequate ROM spine, hips, shoulders and knees.  Ext: No edema.   CNS: CN 2-12 intact, power tone and sensation normal throughout.   Skin: Intact, no visible lesions or rashes.  Psych: Good eye contact, normal affect.  Memory intact, not anxious or depressed appearing.    Impression & Recommendations:  Problem # 1:  ACUTE BRONCHITIS (ICD-466.0) Assessment Comment Only  The following medications were removed from the medication list:    Veetids 500 Mg Tabs (Penicillin v potassium) .Marland Kitchen... Take 1 tablet by mouth  three times a day    Tessalon Perles 100 Mg Caps (Benzonatate) .Marland Kitchen... Take 1 capsule by mouth three times a day Her updated medication list for this problem includes:    Penicillin V Potassium 500 Mg Tabs (Penicillin v potassium) .Marland Kitchen... Take 1 tablet by mouth three times a day    Tessalon Perles 100 Mg  Caps (Benzonatate) .Marland Kitchen... Take 1 capsule by mouth three times a day  Problem # 2:  ACUTE MAXILLARY SINUSITIS (ICD-461.0) Assessment: Comment Only  The following medications were removed from the medication list:    Flonase 50 Mcg/act Susp (Fluticasone propionate) .Marland Kitchen... 2 puffs per nostril daily    Veetids 500 Mg Tabs (Penicillin v potassium) .Marland Kitchen... Take 1 tablet by mouth three times a day    Tessalon Perles 100 Mg Caps (Benzonatate) .Marland Kitchen... Take 1 capsule by mouth three times a day Her updated medication list for this problem includes:    Penicillin V Potassium 500 Mg Tabs (Penicillin v potassium) .Marland Kitchen... Take 1 tablet by mouth three times a day    Tessalon Perles 100 Mg Caps (Benzonatate) .Marland Kitchen... Take 1 capsule by mouth three times a day  Problem # 3:  MORBID OBESITY (ICD-278.01) Assessment: Improved  Ht: 63.5 (10/05/2010)   Wt: 272 (10/05/2010)   BMI: 47.60 (10/05/2010) therapeutic lifestyle change discussed and encouraged  Problem # 4:  ESSENTIAL HYPERTENSION (ICD-401.9) Assessment: Improved  The following medications were removed from the medication list:    Furosemide 40 Mg Tabs (Furosemide) ..... One tab by mouth once daily    Norvasc 10 Mg Tabs (Amlodipine besylate) ..... One tab by mouth once daily    Amlodipine Besylate 10 Mg Tabs (Amlodipine besylate) ..... One tab by mouth once daily    Maxzide-25 37.5-25 Mg Tabs (Triamterene-hctz) .Marland Kitchen... Take 1 tablet by mouth once a day Her updated medication list for this problem includes:    Amlodipine Besylate 5 Mg Tabs (Amlodipine besylate) .Marland Kitchen... Take 1 tablet by mouth once a day, dose reduction in place  BP today: 104/80 Prior BP: 122/80 (11/02/2009)  Labs Reviewed: K+: 4.2 (08/30/2009) Creat: : 0.92 (08/30/2009)   Chol: 223 (08/30/2009)   HDL: 43 (08/30/2009)   LDL: 168 (08/30/2009)   TG: 61 (08/30/2009)  Problem # 5:  HYPERLIPIDEMIA (ICD-272.4) Assessment: Comment Only    HDL:43 (08/30/2009), 44 (10/27/2008)  LDL:168 (08/30/2009),  141 (10/27/2008)  Chol:223 (08/30/2009), 196 (10/27/2008)  Trig:61 (08/30/2009), 55 (10/27/2008) fasting labs past due therapeutic lifestyle change discussed and encouraged  Complete Medication List: 1)  Amlodipine Besylate 5 Mg Tabs (Amlodipine besylate) .... Take 1 tablet by mouth once a day 2)  Penicillin V Potassium 500 Mg Tabs (Penicillin v potassium) .... Take 1 tablet by mouth three times a day 3)  Tessalon Perles 100 Mg Caps (Benzonatate) .... Take 1 capsule by mouth three times a day 4)  Fluconazole 150 Mg Tabs (Fluconazole) .... Take 1 tablet by mouth once a day as needed for vaginal itch  Other Orders: Rocephin  250mg  (Z6109) Admin of Therapeutic Inj  intramuscular or subcutaneous (60454)  Patient Instructions: 1)  Please schedule a follow-up appointment in 3.5 months. 2)  You are being treated for sinuaitis and bronchitis, med are sent in and you will get an injection in the office 3)  Dose reduction of amlodipine, stop the amlodipine 10mg  , start 5mg  tab, BP is low. 4)  BMP prior to visit, ICD-9: 5)  Lipid Panel prior to visit, ICD-9:   fasting in 3.88months 6)  TSH prior  to visit, ICD-9: 7)  CBC w/ Diff prior to visit, ICD-9: Prescriptions: FLUCONAZOLE 150 MG TABS (FLUCONAZOLE) Take 1 tablet by mouth once a day as needed for vaginal itch  #5 x 0   Entered and Authorized by:   Syliva Overman MD   Signed by:   Syliva Overman MD on 10/05/2010   Method used:   Electronically to        [facility] [address] [facility] (retail)       [address]       [place], [place]  [address]       Ph: [phone]       Fax: [phone]   RxID:   [id number] TESSALON PERLES 100 MG CAPS (BENZONATATE) Take 1 capsule by mouth three times a day  #30 x 0   Entered and Authorized by:   Syliva Overman MD   Signed by:   Syliva Overman MD on 10/05/2010   Method used:   Electronically to        [facility] [address] [facility] (retail)       [address]       [place], [place]  [address]       Ph: [phone]       Fax: [phone]   RxID:   [id number] PENICILLIN V POTASSIUM 500 MG TABS (PENICILLIN V POTASSIUM) Take 1 tablet by mouth three times a day  #30 x 0   Entered and Authorized by:   Syliva Overman MD   Signed by:   Syliva Overman MD on 10/05/2010   Method used:   Electronically to        [facility]  [address] [address] [facility] (retail)       [address]       [place], [place]  [address]       Ph: [phone]       Fax: [phone]   RxID:   [id number] AMLODIPINE BESYLATE 5 MG TABS (AMLODIPINE BESYLATE) Take 1 tablet by mouth once a day  #90 x 1   Entered and Authorized by:   Syliva Overman MD   Signed by:   Syliva Overman MD on 10/05/2010   Method used:   Printed then faxed to ...       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 Patient Level IV [10175] 2)  Rocephin  250mg  [J0696] 3)  Admin of Therapeutic Inj  intramuscular or subcutaneous [96372]     Medication Administration  Injection # 1:    Medication: Rocephin  250mg     Diagnosis: SINUSITIS, CHRONIC (  ICD-473.9)    Route: IM    Site: RUOQ gluteus    Exp Date: 01/14    Lot #: [id number]    Mfr: [email or web address]    Comments: rocephin 500mg  given    Patient tolerated injection without complications    Given by: Adella Hare LPN (October 05, 2010 10:05 AM)  Orders Added: 1)  Est. Patient Level IV [87564] 2)  Rocephin  250mg  [J0696] 3)  Admin of  Therapeutic Inj  intramuscular or subcutaneous [33295]

## 2010-11-27 NOTE — Progress Notes (Signed)
[facility]: [facility]   Imported By: Lind Guest 04/24/2010 13:05:02  _____________________________________________________________________  External Attachment:    Type:   Image     Comment:   External Document

## 2010-11-27 NOTE — Letter (Signed)
Summary: dose reduction medicine  dose reduction medicine   Imported By: Lind Guest 10/05/2010 13:42:47  _____________________________________________________________________  External Attachment:    Type:   Image     Comment:   External Document

## 2010-11-27 NOTE — Assessment & Plan Note (Signed)
Summary: office visit   Vital Signs:  Patient profile:   35 year old female Menstrual status:  regular Height:      63.5 inches Weight:      290.75 pounds BMI:     50.88 O2 Sat:      98 % Temp:     98.6 degrees F oral Pulse rate:   91 / minute Pulse rhythm:   irregular Resp:     16 per minute BP sitting:   122 / 80 Cuff size:   xl  Vitals Entered By: Everitt Amber (November 02, 2009 1:56 PM)  Nutrition Counseling: Patient's BMI is greater than 25 and therefore counseled on weight management options. CC: allergies and sinus flaring up, sneezing, coughing, yellowish/red phlegm, some dizziness for about 4 days   Primary Care Provider:  Syliva Overman  CC:  allergies and sinus flaring up, sneezing, coughing, yellowish/red phlegm, and some dizziness for about 4 days.  History of Present Illness: Pt c/o 4 day h/o progressive head and chest congestion, nasal stuffiness, green nasal discharge and cough productive of green sputum. She has had intermittent chillsand fever also. Prior to this she had been well.  She has decide to persue surgical intervention, which i believe is appropriate for her. She is in the mentime trying to lose weight through lifestyle change.  Preventive Screening-Counseling & Management  Alcohol-Tobacco     Smoking Status: never  Current Medications (verified): 1)  Amlodipine Besylate 10 Mg Tabs (Amlodipine Besylate) .... One Tab By Mouth Once Daily 2)  Klor-Con M10 10 Meq Cr-Tabs (Potassium Chloride Crys Cr) .... Take 2 Tabs By Mouth Everyday 3)  Maxzide-25 37.5-25 Mg Tabs (Triamterene-Hctz) .... Take 1 Tablet By Mouth Once A Day 4)  Flonase 50 Mcg/act Susp (Fluticasone Propionate) .... 2 Puffs Per Nostril Daily  Allergies (verified): 1)  ! Vicodin  Review of Systems      See HPI General:  Complains of chills, fatigue, and malaise. Eyes:  Denies blurring and discharge. ENT:  Complains of nasal congestion, postnasal drainage, and sinus pressure;  denies sore throat. CV:  Denies chest pain or discomfort, palpitations, and swelling of feet. Resp:  Complains of cough, sputum productive, and wheezing. GI:  Denies abdominal pain, constipation, diarrhea, nausea, and vomiting. GU:  Denies dysuria and urinary frequency. MS:  Denies joint pain and stiffness. Neuro:  Complains of headaches; denies seizures and sensation of room spinning. Psych:  Denies anxiety and depression. Endo:  Denies cold intolerance, excessive hunger, excessive thirst, excessive urination, heat intolerance, polyuria, and weight change.  Physical Exam  General:  Well-developed,morbidly obese,in no acute distress; alert,appropriate and cooperative throughout examination. ill appearing. HEENT: No facial asymmetry,  EOMI, positive maxillary sinus  tenderness, TM's Clear, oropharynx  pink and moist.   Chest: decresed air entry bilaterally, scattered crackles, no wheezes CVS: S1, S2, No murmurs, No S3.   Abd: Soft, Nontender.  MS: Adequate ROM spine, hips, shoulders and knees.  Ext: No edema.   CNS: CN 2-12 intact, power tone and sensation normal throughout.   Skin: Intact, no visible lesions or rashes.  Psych: Good eye contact, normal affect.  Memory intact, not anxious or depressed appearing.    Impression & Recommendations:  Problem # 1:  ACUTE BRONCHITIS (ICD-466.0) Assessment Comment Only  The following medications were removed from the medication list:    Septra Ds 800-160 Mg Tabs (Sulfamethoxazole-trimethoprim) .Marland Kitchen... Take 1 tablet by mouth two times a day    Tessalon Perles 100 Mg  Caps (Benzonatate) .Marland Kitchen... Take 1 capsule by mouth three times a day Her updated medication list for this problem includes:    Veetids 500 Mg Tabs (Penicillin v potassium) .Marland Kitchen... Take 1 tablet by mouth three times a day    Tessalon Perles 100 Mg Caps (Benzonatate) .Marland Kitchen... Take 1 capsule by mouth three times a day  Orders: Rocephin  250mg  (H0865) Admin of Therapeutic Inj   intramuscular or subcutaneous (78469)  Problem # 2:  ACUTE MAXILLARY SINUSITIS (ICD-461.0) Assessment: Comment Only  The following medications were removed from the medication list:    Septra Ds 800-160 Mg Tabs (Sulfamethoxazole-trimethoprim) .Marland Kitchen... Take 1 tablet by mouth two times a day    Tessalon Perles 100 Mg Caps (Benzonatate) .Marland Kitchen... Take 1 capsule by mouth three times a day Her updated medication list for this problem includes:    Flonase 50 Mcg/act Susp (Fluticasone propionate) .Marland Kitchen... 2 puffs per nostril daily    Veetids 500 Mg Tabs (Penicillin v potassium) .Marland Kitchen... Take 1 tablet by mouth three times a day    Tessalon Perles 100 Mg Caps (Benzonatate) .Marland Kitchen... Take 1 capsule by mouth three times a day  Problem # 3:  MORBID OBESITY (ICD-278.01) Assessment: Improved  Ht: 63.5 (11/02/2009)   Wt: 290.75 (11/02/2009)   BMI: 50.88 (11/02/2009)  Problem # 4:  ESSENTIAL HYPERTENSION (ICD-401.9) Assessment: Unchanged  Her updated medication list for this problem includes:    Amlodipine Besylate 10 Mg Tabs (Amlodipine besylate) ..... One tab by mouth once daily    Maxzide-25 37.5-25 Mg Tabs (Triamterene-hctz) .Marland Kitchen... Take 1 tablet by mouth once a day  Orders: T-Basic Metabolic Panel [id number] [id number])  BP today: 122/80 Prior BP: 124/84 (08/30/2009)  Labs Reviewed: K+: 4.2 (08/30/2009) Creat: : 0.92 (08/30/2009)   Chol: 223 (08/30/2009)   HDL: 43 (08/30/2009)   LDL: 168 (08/30/2009)   TG: 61 (08/30/2009)  Problem # 5:  HYPERLIPIDEMIA (ICD-272.4) Assessment: Comment Only  Orders: T-Hepatic Function [id number]) T-Lipid Profile ([id number])    HDL:43 (08/30/2009), 44 (10/27/2008)  LDL:168 (08/30/2009), 141 (10/27/2008)  Chol:223 (08/30/2009), 196 (10/27/2008)  Trig:61 (08/30/2009), 55 (10/27/2008) currently following low fat diet only will rept in March, if not improved needs lipid lowering drugs  Complete Medication List: 1)  Amlodipine Besylate 10 Mg Tabs (Amlodipine besylate) .... One tab  by mouth once daily 2)  Klor-con M10 10 Meq Cr-tabs (Potassium chloride crys cr) .... Take 2 tabs by mouth everyday 3)  Maxzide-25 37.5-25 Mg Tabs (Triamterene-hctz) .... Take 1 tablet by mouth once a day 4)  Flonase 50 Mcg/act Susp (Fluticasone propionate) .... 2 puffs per nostril daily 5)  Veetids 500 Mg Tabs (Penicillin v potassium) .... Take 1 tablet by mouth three times a day 6)  Tessalon Perles 100 Mg Caps (Benzonatate) .... Take 1 capsule by mouth three times a day 7)  Fluconazole 150 Mg Tabs (Fluconazole) .... Take 1 tablet by mouth once a day as needed  Other Orders: Future Orders: Radiology Referral (Radiology) ... 11/08/2009  Patient Instructions: 1)  F/u as before. 2)  It is important that you exercise regularly at least 20 minutes 5 times a week. If you develop chest pain, have severe difficulty breathing, or feel very tired , stop exercising immediately and seek medical attention. 3)  You need to lose weight. Consider a lower calorie diet and regular exercise.  4)  You are being treated for acute sinusitis and bronchitis, meds are sent in and you will get an injection of Rocephin in the office also Prescriptions: FLUCONAZOLE  150 MG TABS (FLUCONAZOLE) Take 1 tablet by mouth once a day as needed  #3 x 0   Entered and Authorized by:   Syliva Overman MD   Signed by:   Syliva Overman MD on 11/02/2009   Method used:   Electronically to        [facility] (retail)       [address]       [place], [place]  [address]       Ph: [phone]       Fax: [phone]   RxID:   [id number] TESSALON PERLES 100 MG CAPS (BENZONATATE) Take 1 capsule by mouth three times a day  #30 x 0   Entered and Authorized by:   Syliva Overman MD   Signed by:   Syliva Overman MD on 11/02/2009   Method used:   Electronically to        [facility] (retail)       [address]       [place], [place]  [address]       Ph: [phone]        Fax: [phone]   RxID:   [id number] VEETIDS 500 MG TABS (PENICILLIN V POTASSIUM) Take 1 tablet by mouth three times a day  #42 x 0   Entered and Authorized by:   Syliva Overman MD   Signed by:   Syliva Overman MD on 11/02/2009   Method used:   Electronically to        [facility]ail)       [address]       [place], [place]  [address]       Ph: [phone]       Fax: [phone]   RxID:   [id number]    Medication Administration  Injection # 1:    Medication: Rocephin  250mg     Diagnosis: ACUTE BRONCHITIS (ICD-466.0)    Route: IM    Site: RUOQ gluteus    Exp Date: 9/12    Lot #: [id number]    Mfr: [email or web address]    Comments: rocephin 500mg  given    Patient tolerated injection without complications    Given by: Worthy Keeler LPN (November 02, 2009 3:06 PM)  Orders Added: 1)  Est. Patient Level IV [[address]] 2)  T-Basic Metabolic Panel [[id number]] 3)  T-Hepatic Function [37169-67893] 4)  T-Lipid Profile [80061-22930] 5)  Rocephin  250mg  [J0696] 6)  Admin of Therapeutic Inj  intramuscular or subcutaneous [96372] 7)  Radiology Referral [Radiology]

## 2010-11-27 NOTE — Letter (Signed)
Summary: [facility]   Imported By: Lind Guest 11/17/2009 16:40:07  _____________________________________________________________________  External Attachment:    Type:   Image     Comment:   External Document

## 2010-11-27 NOTE — Letter (Signed)
Summary: Out of Work  [facility]  [address]   [place], [place] [address]   Phone: [phone]  Fax: [phone]    November 02, 2009   Employee:  CHERALYN OLIVER MOORE-LYONS    To Whom It May Concern:   For Medical reasons, please excuse the above named employee from work for the following dates:  Start:   11/02/08  End:   11/03/08 to return with no restrictions  If you need additional information, please feel free to contact our office.         Sincerely,    Milus Mallick. Lodema Hong, MD

## 2010-11-27 NOTE — Progress Notes (Signed)
Summary: returning your call  Phone Note Call from Patient   Summary of Call: [name], patient is returning your call please call her back when you can,  I advised that you might not be back so I will address this to both of [place]. Initial call taken by: Curtis Sites,  May 29, 2010 2:27 PM  Follow-up for Phone Call        patient only need appt scheduled and this has been done Follow-up by: Adella Hare LPN,  May 30, 2010 2:16 PM

## 2010-12-12 ENCOUNTER — Ambulatory Visit: Payer: Self-pay | Admitting: *Deleted

## 2010-12-14 ENCOUNTER — Encounter: Payer: Self-pay | Admitting: Family Medicine

## 2010-12-14 ENCOUNTER — Ambulatory Visit (INDEPENDENT_AMBULATORY_CARE_PROVIDER_SITE_OTHER): Payer: BC Managed Care – PPO | Admitting: Family Medicine

## 2010-12-14 DIAGNOSIS — R51 Headache: Secondary | ICD-10-CM

## 2010-12-14 DIAGNOSIS — J329 Chronic sinusitis, unspecified: Secondary | ICD-10-CM

## 2010-12-14 DIAGNOSIS — I10 Essential (primary) hypertension: Secondary | ICD-10-CM

## 2010-12-14 DIAGNOSIS — R519 Headache, unspecified: Secondary | ICD-10-CM | POA: Insufficient documentation

## 2010-12-19 NOTE — Letter (Signed)
Summary: Work Excuse  [facility]  [address]   [place], [place] [address]   Phone: [phone]  Fax: [phone]    Today's Date: December 14, 2010  Name of Patient: [name]  The above named patient had a medical visit today.  Please take this into consideration when reviewing the time away from work/school.    Special Instructions:  [ * ] None  [  ] To be off the remainder of today, returning to the normal work / school schedule tomorrow.  [  ] To be off until the next scheduled appointment on ______________________.  [  ] Other ________________________________________________________________ ________________________________________________________________________   Sincerely yours,   Milus Mallick. Lodema Hong, MD

## 2010-12-19 NOTE — Assessment & Plan Note (Signed)
Summary: office visit   Vital Signs:  Patient profile:   35 year old female Menstrual status:  regular Height:      63.5 inches Weight:      258.75 pounds BMI:     45.28 O2 Sat:      97 % Pulse rate:   81 / minute Pulse rhythm:   regular Resp:     16 per minute BP sitting:   160 / 104  (left arm) Cuff size:   xl  Vitals Entered By: Everitt Amber LPN (December 14, 2010 8:19 AM)  Nutrition Counseling: Patient's BMI is greater than 25 and therefore counseled on weight management options. CC: Follow up chronic problems, has been having high BP x 1 week    Primary Care Provider:  Syliva Overman  CC:  Follow up chronic problems and has been having high BP x 1 week .  History of Present Illness: Pt reports a 4 day h/o frontal pressure, with increased allergy symptoms.she denies any fever or chills. She denies green nasal drainage or productive cough. she states she had her BP checked at work recently, and it was high, she is in to have this reviewed.She continues to lose weight well with dietary change post surgery, her exercise has been less in recent  times becauseof increased responsibility at home.  Current Medications (verified): 1)  Amlodipine Besylate 5 Mg Tabs (Amlodipine Besylate) .... Take 1 Tablet By Mouth Once A Day  Allergies (verified): 1)  ! Vicodin 2)  ! Vicodin  Review of Systems      See HPI General:  Denies fatigue. Eyes:  Denies blurring, discharge, and eye pain. CV:  Denies chest pain or discomfort. Neuro:  Complains of headaches; 4 day . Endo:  Denies cold intolerance, excessive hunger, excessive thirst, excessive urination, and heat intolerance. Heme:  Denies abnormal bruising and bleeding. Allergy:  Complains of seasonal allergies.  Physical Exam  General:  Well-developed,obese,in no acute distress; alert,appropriate and cooperative throughout examination HEENT: No facial asymmetry,  EOMI, no  sinus tenderness, TM's Clear, oropharynx  pink and  moist. erythema and edema of nasal mucosa  Chest:clear to ascultation CVS: S1, S2, No murmurs, No S3.   Abd: Soft, Nontender.  MS: Adequate ROM spine, hips, shoulders and knees.  Ext: No edema.   CNS: CN 2-12 intact, power tone and sensation normal throughout.   Skin: Intact, no visible lesions or rashes.  Psych: Good eye contact, normal affect.  Memory intact, not anxious or depressed appearing.    Impression & Recommendations:  Problem # 1:  HEADACHE (ICD-784.0) Assessment Comment Only  Orders: Depo- Medrol 80mg  (J1040) Ketorolac-Toradol 15mg  (Z6109) Admin of Therapeutic Inj  intramuscular or subcutaneous (60454)  Problem # 2:  ALLERGIC RHINITIS (ICD-477.9) Assessment: Deteriorated pt to start daily use of steroid nasal spray, nasonex , also zyrtec, and n/s nasal rinse. Depomedrol administered at visit also, and prednisone dose pack prescribed  Problem # 3:  MORBID OBESITY (ICD-278.01) Assessment: Improved  Ht: 63.5 (12/14/2010)   Wt: 258.75 (12/14/2010)   BMI: 45.28 (12/14/2010)  Problem # 4:  ESSENTIAL HYPERTENSION (ICD-401.9) Assessment: Deteriorated  Her updated medication list for this problem includes:    Amlodipine Besylate 5 Mg Tabs (Amlodipine besylate) .Marland Kitchen... Take 1 tablet by mouth once a day    Triamterene-hctz 37.5-25 Mg Tabs (Triamterene-hctz) .Marland Kitchen... Take 1 tablet by mouth once a day  BP today: 160/104 Prior BP: 104/80 (10/05/2010)  Labs Reviewed: K+: 4.2 (08/30/2009) Creat: : 0.92 (08/30/2009)  Chol: 223 (08/30/2009)   HDL: 43 (08/30/2009)   LDL: 168 (08/30/2009)   TG: 61 (08/30/2009)  Complete Medication List: 1)  Amlodipine Besylate 5 Mg Tabs (Amlodipine besylate) .... Take 1 tablet by mouth once a day 2)  Triamterene-hctz 37.5-25 Mg Tabs (Triamterene-hctz) .... Take 1 tablet by mouth once a day 3)  Cerrtrizine 10mg   .... Take 1 tablet by mouth once a day 4)  Prednisone (pak) 5 Mg Tabs (Prednisone) .... Use as directed  Patient Instructions: 1)   Please schedule a follow-up appointment in 1 month. 2)  It is important that you exercise regularly at least 60 minutes 5 times a week. If you develop chest pain, have severe difficulty breathing, or feel very tired , stop exercising immediately and seek medical attention. 3)  You need to lose weight. Consider a lower calorie diet and regular exercise. Congrats on weight loss 4)  Two injections today for headache, new meds as discussed for blood pressure and allergies Prescriptions: PREDNISONE (PAK) 5 MG TABS (PREDNISONE) Use as directed  #21 x 0   Entered and Authorized by:   Syliva Overman MD   Signed by:   Syliva Overman MD on 12/14/2010   Method used:   Print then Give to Patient   RxID:   [id number] CERRTRIZINE 10MG  Take 1 tablet by mouth once a day  #90 x 1   Entered and Authorized by:   Syliva Overman MD   Signed by:   Syliva Overman MD on 12/14/2010   Method used:   Print then Give to Patient   RxID:   [id number] TRIAMTERENE-HCTZ 37.5-25 MG TABS (TRIAMTERENE-HCTZ) Take 1 tablet by mouth once a day  #90 x 1   Entered and Authorized by:   Syliva Overman MD   Signed by:   Syliva Overman MD on 12/14/2010   Method used:   Electronically to        [facility]       [address]       [place], [place]  [address]       Ph: [phone]       Fax: [phone]   RxID:   [id number]    Medication Administration  Injection # 1:    Medication: Depo- Medrol 80mg     Diagnosis: HEADACHE (ICD-784.0)    Route: IM    Site: RUOQ gluteus    Exp Date: 07/12    Lot #: Gunnar [id number]    Mfr: Pharmacia    Patient tolerated injection without complications    Given by: Adella Hare LPN (December 14, 2010 9:26 AM)  Injection # 2:    Medication: Ketorolac-Toradol 15mg     Diagnosis: HEADACHE (ICD-784.0)    Route: IM    Site: LUOQ gluteus    Exp Date: 03/28/2012    Lot #: [id number]    Mfr: [email or web address]    Comments: toradol 60mg  given     Patient tolerated injection without complications    Given by: Adella Hare LPN (December 14, 2010 9:27 AM)  Orders Added: 1)  Est. Patient Level IV [93810] 2)  Depo- Medrol 80mg  [J1040] 3)  Ketorolac-Toradol 15mg  [J1885] 4)  Admin of Therapeutic Inj  intramuscular or subcutaneous [96372]     Medication Administration  Injection # 1:    Medication: Depo- Medrol 80mg     Diagnosis: HEADACHE (ICD-784.0)    Route: IM    Site: RUOQ gluteus    Exp Date:  07/12    Lot #: Gunnar Bulla    Mfr: Pharmacia    Patient tolerated injection without complications    Given by: Adella Hare LPN (December 14, 2010 9:26 AM)  Injection # 2:    Medication: Ketorolac-Toradol 15mg     Diagnosis: HEADACHE (ICD-784.0)    Route: IM    Site: LUOQ gluteus    Exp Date: 03/28/2012    Lot #: [id number]    Mfr: [email or web address]    Comments: toradol 60mg  given    Patient tolerated injection without complications    Given by: Adella Hare LPN (December 14, 2010 9:27 AM)  Orders Added: 1)  Est. Patient Level IV [29562] 2)  Depo- Medrol 80mg  [J1040] 3)  Ketorolac-Toradol 15mg  [J1885] 4)  Admin of Therapeutic Inj  intramuscular or subcutaneous [13086]

## 2011-01-02 ENCOUNTER — Encounter: Payer: BC Managed Care – PPO | Attending: General Surgery | Admitting: *Deleted

## 2011-01-02 DIAGNOSIS — Z713 Dietary counseling and surveillance: Secondary | ICD-10-CM | POA: Insufficient documentation

## 2011-01-02 DIAGNOSIS — Z9884 Bariatric surgery status: Secondary | ICD-10-CM | POA: Insufficient documentation

## 2011-01-02 DIAGNOSIS — Z09 Encounter for follow-up examination after completed treatment for conditions other than malignant neoplasm: Secondary | ICD-10-CM | POA: Insufficient documentation

## 2011-01-09 LAB — DIFFERENTIAL
Basophils Relative: 0 % (ref 0–1)
Basophils Relative: 1 % (ref 0–1)
Eosinophils Absolute: 0 10*3/uL (ref 0.0–0.7)
Eosinophils Absolute: 0.1 10*3/uL (ref 0.0–0.7)
Eosinophils Relative: 1 % (ref 0–5)
Lymphocytes Relative: 34 % (ref 12–46)
Monocytes Absolute: 0.5 10*3/uL (ref 0.1–1.0)
Monocytes Absolute: 0.5 10*3/uL (ref 0.1–1.0)
Monocytes Relative: 4 % (ref 3–12)
Monocytes Relative: 6 % (ref 3–12)
Neutro Abs: 4.5 10*3/uL (ref 1.7–7.7)
Neutrophils Relative %: 58 % (ref 43–77)

## 2011-01-09 LAB — COMPREHENSIVE METABOLIC PANEL
ALT: 19 U/L (ref 0–35)
AST: 14 U/L (ref 0–37)
Albumin: 3.6 g/dL (ref 3.5–5.2)
Alkaline Phosphatase: 78 U/L (ref 39–117)
BUN: 8 mg/dL (ref 6–23)
Chloride: 106 mEq/L (ref 96–112)
Creatinine, Ser: 0.86 mg/dL (ref 0.4–1.2)
GFR calc Af Amer: >60 mL/min (ref >60)
Potassium: 3.5 mEq/L (ref 3.5–5.1)
Total Bilirubin: 0.4 mg/dL (ref 0.3–1.2)

## 2011-01-09 LAB — CBC
HCT: 40.2 % (ref 36.0–46.0)
Hemoglobin: 13.3 g/dL (ref 12.0–15.0)
Hemoglobin: 13.8 g/dL (ref 12.0–15.0)
MCH: 28.7 pg (ref 26.0–34.0)
MCHC: 33.2 g/dL (ref 30.0–36.0)
MCV: 86.1 fL (ref 78.0–100.0)
MCV: 86.7 fL (ref 78.0–100.0)
Platelets: 160 10*3/uL (ref 150–400)
RBC: 4.64 MIL/uL (ref 3.87–5.11)

## 2011-01-17 ENCOUNTER — Encounter: Payer: Self-pay | Admitting: Family Medicine

## 2011-01-18 ENCOUNTER — Encounter: Payer: Self-pay | Admitting: Family Medicine

## 2011-01-21 ENCOUNTER — Ambulatory Visit: Payer: BC Managed Care – PPO | Admitting: Family Medicine

## 2011-01-21 ENCOUNTER — Encounter: Payer: Self-pay | Admitting: Family Medicine

## 2011-02-04 LAB — COMPREHENSIVE METABOLIC PANEL
ALT: 24 U/L (ref 0–35)
Calcium: 8.9 mg/dL (ref 8.4–10.5)
Creatinine, Ser: 0.85 mg/dL (ref 0.4–1.2)
Glucose, Bld: 93 mg/dL (ref 70–99)
Sodium: 138 mEq/L (ref 135–145)
Total Protein: 6.9 g/dL (ref 6.0–8.3)

## 2011-02-04 LAB — DIFFERENTIAL
Eosinophils Absolute: 0 10*3/uL (ref 0.0–0.7)
Lymphocytes Relative: 14 % (ref 12–46)
Lymphs Abs: 1.2 10*3/uL (ref 0.7–4.0)
Monocytes Relative: 5 % (ref 3–12)
Neutro Abs: 6.6 10*3/uL (ref 1.7–7.7)
Neutrophils Relative %: 80 % — ABNORMAL HIGH (ref 43–77)

## 2011-02-04 LAB — URINALYSIS, ROUTINE W REFLEX MICROSCOPIC
Bilirubin Urine: NEGATIVE
Ketones, ur: NEGATIVE mg/dL
Nitrite: NEGATIVE
Protein, ur: NEGATIVE mg/dL
Urobilinogen, UA: 0.2 mg/dL (ref 0.0–1.0)

## 2011-02-04 LAB — PREGNANCY, URINE: Preg Test, Ur: NEGATIVE

## 2011-02-04 LAB — POCT CARDIAC MARKERS
CKMB, poc: <1 ng/mL — ABNORMAL LOW (ref 1.0–8.0)
Myoglobin, poc: 43 ng/mL (ref 12–200)
Troponin i, poc: <0.05 ng/mL (ref 0.00–0.09)

## 2011-02-04 LAB — CBC
Hemoglobin: 13.6 g/dL (ref 12.0–15.0)
MCHC: 34.6 g/dL (ref 30.0–36.0)
RDW: 13.9 % (ref 11.5–15.5)

## 2011-02-07 ENCOUNTER — Ambulatory Visit: Payer: Self-pay | Admitting: Family Medicine

## 2011-03-12 NOTE — Assessment & Plan Note (Signed)
[facility]                       [place] [facility] OFFICE NOTE   [name], [name]                         MRN:          [id number]  DATE:05/12/2007                            DOB:          08/05/1976    Ms. [name] is seen as a new patient.  She was referred from the ER here  at [facility] ([facility]).  She was seen twice in the past two weeks for  increasing shortness of breath.  She is 35 years old.  She has positive  family history of coronary disease and diabetes.  She has hypertension.  She had noticed increasing shortness of breath over a few day period.  Her children had had a cold, but she did not notice any fever.  She did  have a bit of a cough but no sputum production.  Her shortness of breath  progressed over three days, and she gradually had some chest tightness.  This sounded more related to her shortness of breath.  There was no  radiation, no diaphoresis.  The patient ruled out for myocardial  infarction.  She had a chest x-ray that said question mild congestive  heart failure, but it was poorly penetrated.  She was treated with  oxycodone and Lasix tablet.   LABORATORY DATA:  Remarkable for creatinine 1, BUN 12.  Interestingly,  her troponin and CPK were negative, and her BNP was only 109.   Since being discharged from the ER, the patient has been doing fine.  She has had her blood pressure medicines adjusted.  She had previously  been on an ACE inhibitor and benazepril.  Apparently this was not  covered by insurance.  During her pregnancy she had some eclampsia and  was on Aldomet so this was restarted.   REVIEW OF SYSTEMS:  Otherwise, negative.   CURRENT MEDICATIONS:  1. Aldomet 500 mg a day.  2. Lasix 40 mg a day.  3. Potassium 10 mg a day.   PAST SURGICAL HISTORY:  Her previous surgical history is primarily  remarkable for two pregnancies.  She has a 82-year-old and a 51-year-old.   REVIEW OF SYSTEMS:  Otherwise, negative.  She  has gained about 30 pounds  since her pregnancies.   FAMILY HISTORY:  Remarkable for diabetes on mother's and father's side.  Mother had a stent at age 83, and father had bypass in his 36's.  Remainder of her Family History is noncontributory.  The patient is  happily married.  She has a 20-year-old and a 61-year-old.  She teaches  ninth grade biology and is, otherwise, sedentary.  She does not smoke or  drink.   ALLERGIES:  She is actually allergic to [name].   PHYSICAL EXAMINATION:  GENERAL:  She is an obese black female in no  distress.  Affect is appropriate.  VITAL SIGNS:  Blood pressure 120/90, pulse 70 and regular, weight 278,  respiratory rate 14.  BREASTS:  She is very large breasted.  HEENT:  Normal.  NECK:  There is no JVP elevation, no thyromegaly, no lymphadenopathy.  LUNGS:  Clear with good diaphragmatic motion.  HEART:  There is an S1, S2 with distant heart sounds.  PMI is not  palpable.  ABDOMEN:  Protuberant.  There is no hepatosplenomegaly, hepatojugular  reflux.  She is status post C-section.  There is no pain, no AAA.  EXTREMITIES:  Distal pulses are intact with no edema.  She has very  thick ankles,  however.  NEUROLOGICAL:  Nonfocal.  There is no muscular weakness.   Her baseline EKG is normal.   IMPRESSION:  1. Shortness of breath.  I am not sure what to make of this.  She may      have retained some fluid and had some diastolic dysfunction.      However, BNP was quite low.  I suspect her chest x-ray was not      penetrated.  She seems to be responding to the Lasix, and I think      it is fine to keep this up if it will also help with her blood      pressure.  She will have a 2D echocardiogram to assess her right      ventricle and left ventricle function.  2. Hypertension currently well-controlled on Aldomet and Lasix.  I      think she would probably be better served in the long term with ACE      inhibitors.  I will leave this up to Dr. Lodema Taylor.  3.  Risk for diabetes.  The patient has a family history for diabetes,      and she is significantly overweight.  It may be worthwhile to check      a hemoglobin A1c on her every six months, and she may need glucose      tolerance test.  4. Lower extremity edema improved, dependent likely secondary to      weight.  Continue Lasix.  5. Morbid obesity.  The patient knows she needs to try to increase her      activity level.  She would be a candidate for bariatric surgery.      She will try to work on decreasing her caloric intake.     [name]. Tamara Emms, MD, [facility]  Electronically Signed    PCN/MedQ  DD: 05/12/2007  DT: 05/13/2007  Job #: [id number]

## 2011-03-12 NOTE — Procedures (Signed)
[name], [name]                ACCOUNT NO.:  [id number]   MEDICAL RECORD NO.:  [id number]          PATIENT TYPE:  OUT   LOCATION:  RAD                           FACILITY:  [facility]   PHYSICIAN:  Gerrit Friends. Dietrich Pates, MD, FACCDATE OF BIRTH:  May 07, 1976   DATE OF PROCEDURE:  05/26/2007  DATE OF DISCHARGE:                                ECHOCARDIOGRAM   CLINICAL DATA:  35 year old woman with chest pain, dyspnea and  hypertension.  Aorta 2.4, left atrium 3.8, septum 1.1, posterior wall  1.2, LV diastole 4.3, LV systole 3.4.   1. Technically adequate echocardiographic study.  2. Normal left atrium, right atrium and right ventricle.  3. Normal proximal ascending aorta and proximal pulmonary artery.  4. Normal aortic, mitral, tricuspid and pulmonic valves.  5. Normal internal dimension, wall thickness, regional and global      function of the left ventricle.  6. Normal IVC.  7. Normal Doppler study; trivial mitral regurgitation.      Gerrit Friends. Dietrich Pates, MD, [facility]  Electronically Signed     RMR/MEDQ  D:  05/27/2007  T:  05/27/2007  Job:  [id number]

## 2011-03-12 NOTE — Assessment & Plan Note (Signed)
[facility]                       [place] CARDIOLOGY OFFICE NOTE   [name], [name]                       MRN:          [id number]  DATE:07/08/2007                            DOB:          1976-05-21    Ms. [name] returns today for follow-up.  She was initially referred to me  for dyspnea, lower extremity edema, and hypertension.   The patient has been doing well.  She has been on Lasix since the end of  July.  Apparently she saw Dr. Lodema Hong and felt that she did not need to  be on this diuretic anymore.  She takes methyldopa for her blood  pressure.   I talked to [place] at length.  I think that she clearly needs to be on a  blood pressure pill and given her morbid obesity should be on a  diuretic.  It may be that the Lasix at 40 mg is too much for her,  however, I think a better option for her would be to stop her methyldopa  or stop her furosemide and start Lisinopril 20/12.5 with a dose of  hydrochlorothiazide.   The patient is willing to try this.  She will continue to cut down the  salt in her diet.  Her shortness of breath is improved.  Her lower  extremity edema is improved.   REVIEW OF SYSTEMS:  Otherwise negative.   MEDICATIONS:  1. Klor-Con 10 mg a day.  2. Lisinopril hydrochlorothiazide 20/12.5.   PHYSICAL EXAMINATION:  GENERAL:  Remarkable for an overweight black  female in no acute distress.  VITAL SIGNS:  Weight is 284, blood pressure 147/85, pulse 54 and  regular.  Affect is appropriate.  Respiratory rate is 16.  She is  afebrile.  HEENT:  Normal.  Carotids are normal without bruit.  There is no  lymphadenopathy, no thyromegaly, no JVP elevation.  LUNGS:  Clear with good diaphragmatic motion.  No wheezing.  HEART:  There is an S1 and S2 with distant heart sounds.  PMI is not  palpable.  ABDOMEN:  Bowel sounds are positive.  No AAA and no tenderness, no  bruits.  No hepatosplenomegaly or hepatojugular reflux.  EXTREMITIES:  Femorals are deep and not palpable.  Distal pulses are  intact.  There is trace to +1 edema bilaterally.  NEUROLOGY:  Nonfocal.  There is no muscular weakness.  SKIN:  Warm and dry.   EKG at baseline shows sinus rhythm with nonspecific ST T was changes.  She had a two-dimensional echocardiogram which was normal.  There was no  evidence of core pulmonale or pulmonary hypertension.  She had normal LV  function and no valvular heart disease.   IMPRESSION:  1. Hypertension.  Switch medications, lisinopril hydrochlorothiazide      with 10 mEq of Klor-Con.  Follow up in three months.  Continue low      salt diet.  2. Lower extremity edema.  Dependent.  Continue with low dose      diuretic, low salt, and elevate legs at the end of the day.  3. Dyspnea improved to likely function.  No evidence  of poor RV or LV      function.  No evidence of congestive heart failure.  BNP was only      106 when tested in the ER.  I will see her back in 2-3 months to      further assess her blood pressure and then so long as this is fine,      she will follow up with Dr. Lodema Hong for all of her general care.     Tamara Pick. Eden Emms, MD, [facility]  Electronically Signed    PCN/MedQ  DD: 07/08/2007  DT: 07/09/2007  Job #: [id number]

## 2011-03-12 NOTE — Op Note (Signed)
NAMEANALYAH, [name]                ACCOUNT NO.:  [id number]   MEDICAL RECORD NO.:  [id number]          PATIENT TYPE:  AMB   LOCATION:  DAY                          FACILITY:  [facility]   PHYSICIAN:  Timothy E. Earlene Plater, M.D. DATE OF BIRTH:  17-Jan-1976   DATE OF PROCEDURE:  11/13/2007  DATE OF DISCHARGE:                               OPERATIVE REPORT   PREOPERATIVE DIAGNOSIS:  Biliary dyskinesia.   POSTOPERATIVE DIAGNOSIS:  Biliary dyskinesia.   PROCEDURE:  Laparoscopic cholecystectomy.   ASSISTANT:  Ollen Gross. Vernell Morgans, M.D.   ANESTHESIA:  General.   INDICATIONS FOR PROCEDURE:  [name] is 41, has a normal gallbladder  ultrasound, an ejection fraction of 8% and symptoms perfectly compatible  with cholecystitis.  After careful discussion, she wishes to proceed  with this surgery.  Her risk factors are obesity.  Her laboratory data  are normal today.  She is seen, identified and the permit signed.   PROCEDURE IN DETAIL:  She is taken to the operating room and placed  supine.  General endotracheal anesthesia administered.  Abdomen is  prepped and draped in usual fashion.  Marcaine 0.5% with epinephrine was  used prior to each incision.  A vertical infraumbilical incision made.  The fascia identified, opened in the midline and the peritoneum entered  without complication.  Hassan catheter placed and tied in place with #1  Vicryl.  The abdomen insufflated.  General peritoneoscopy was  unremarkable except for some adhesions to the lower midline of an  otherwise normal appearing omentum.  The gallbladder was normal in color  and contour.  There were some adhesions to the omentum.  A second 10 mm  trocar was placed in midepigastrium, two 5 mm trocars right upper  quadrant.  Gallbladder was grasped, placed on tension.  The adhesions  taken down.  The anatomy of the gallbladder carefully defined.  The  cystic duct was inferior most, behind that was the cystic artery.  Cystic duct dissected out,  clipped near the gallbladder junction,  opened.  It was tiny.  With multiple tries I was unable to pass a  catheter into the tiny cystic duct so the cholangiogram was cancelled.  The stump of the cystic duct doubly clipped and divided.  The artery  triply clipped and divided and the gallbladder removed from the  gallbladder bed without complications.  It was placed in an EndoCatch  bag and removed from the abdomen through the infraumbilical incision  which was tied under direct vision.  All wounds were inspected.  The  gallbladder bed was dry.  Irrigant was clear.  All irrigant, CO2,  instruments and trocars removed under direct vision.  All counts  correct.  All skin wounds checked and closed  with Monocryl and Steri-Strips.  She tolerated it well, was awaken and  taken to recovery room in good condition.  Written and verbal  instructions were given including the Demerol for pain and she will be  seen and followed as an outpatient.      Timothy E. Earlene Plater, M.D.  Electronically Signed     TED/MEDQ  D:  11/13/2007  T:  11/13/2007  Job:  [id number]   cc:   Milus Mallick. Lodema Hong, M.D.  Fax: [phone]

## 2011-03-12 NOTE — Assessment & Plan Note (Signed)
[facility]                       [place] CARDIOLOGY OFFICE NOTE   Jarome Lamas EVALYN SHULTIS                       MRN:          [id number]  DATE:12/02/2007                            DOB:          1975-11-02    HISTORY:  [name] returns today for followup.  She has had exertional  dyspnea which is functional from her central obesity.  She has  hypertension.  We tried to put her on lisinopril, but she had a cough.  In the interim since I last saw her, she had her gallbladder removed by  Dr. Earlene Plater in [place].  She has recovered from this well.  She is back  on Lasix 40 a day, Klor-Con 10 a day and Norvasc 10 a day.   REVIEW OF SYSTEMS:  Otherwise negative.  She is not having significant  chest pain or dyspnea and is functional.  There is no pleuritic pain.  No PND, orthopnea.  No increase in the lower extremity edema.  I  explained to her in regards to her medications that there is no generic  ARB and we will keep her on her current meds for the time being.   DIAGNOSTICS:  1. The patient has had a 2-D echocardiogram done May 26, 2007.  She      has essentially a normal echo with no evidence of pulmonary      hypertension and an EF of 60%.  2. Baseline EKG shows sinus rhythm, nonspecific ST/T wave changes.   PHYSICAL EXAMINATION:  VITAL SIGNS:  Remarkable for blood pressure  120/80, weight is 274, pulse is 60, afebrile, respiratory rate 14.  HEENT:  Unremarkable.  NECK:  Carotids are normal without bruit.  No lymphadenopathy,  thyromegaly, JVP elevation.  LUNGS:  Clear with good diaphragmatic motion.  No wheezing.  HEART:  S1-S2, distant heart sounds.  PMI not palpable.  ABDOMEN:  Status post laparoscopic cholecystectomy.  Bowel sounds are  positive.  No bruit.  No AAA.  No hepatosplenomegaly.  No hepatojugular  reflux.  EXTREMITIES:  Distal pulses were intact.  No edema.  NEURO:  Nonfocal.  SKIN:  Warm and dry.   IMPRESSION:  1. Lower  extremity edema, improved.  Continue current dose of Lasix.  2. Hypertension, currently well controlled.  Intolerant to ACE      inhibitors due to a cough. Consider switching to ARB when they      become generic.  Continue Klor-Con, Lasix and Norvasc.  3. Central obesity.  The patient will be referred to the bariatric      surgery program at [facility].  She may be interested in a lap band.   PROCEDURE:  I went over all of the issues regarding her weight and need  for improvement.  She seems motivated.  Hopefully, she will look into  this in the future as I think she would do well with it.     [name]. Eden Emms, MD, [facility]  Electronically Signed    PCN/MedQ  DD: 12/02/2007  DT: 12/03/2007  Job #: [id number]

## 2011-03-15 NOTE — Discharge Summary (Signed)
NAME:  [name], [name]                    ACCOUNT NO.:  [id number]   MEDICAL RECORD NO.:  [id number]                   PATIENT TYPE:  INP   LOCATION:  A427                                 FACILITY:  [facility]   PHYSICIAN:  Tilda Burrow, M.D.              DATE OF BIRTH:  May 08, 1976   DATE OF ADMISSION:  06/11/2004  DATE OF DISCHARGE:  06/15/2004                                 DISCHARGE SUMMARY   ADMISSION DIAGNOSES:  1.  Pregnancy [redacted] weeks gestation.  2.  Chronic hypertension.  3.  Unfavorable cervix.  4.  Morbid obesity.   DISCHARGE DIAGNOSES:  1.  Pregnancy [redacted] weeks gestation.  2.  Chronic hypertension.  3.  Unfavorable cervix.  4.  Morbid obesity.  5.  Uncertain fetal status.   PROCEDURE:  1.  June 11, 2004 cervical ripening, Cervidil.  2.  June 12, 2004 she had Pitocin induction of labor.  3.  June 12, 2004, she also had primary cesarean section, J.V. [name],      M.D., delivering an 8 pound 13.2 ounce female infant.  Apgars 9/9 with      clear fluid, no malodor, and fundal placenta.  Posterior presentation of      vertex with nuchal cord x1.   Postoperatively the patient did well.  Had a Jackson-Pratt drain left in for  24 hours and then removed.  She was kept for 2 days, had very average  postoperative course.  Postoperative hematocrit was 31.6 on third  postoperative day, compared to 33.3 on admission.  Blood gas on the infant  was pH 7.270, PCO2 62, PO2 21.  The patient had routine postoperative  instructions given and was discharged June 15, 2004 for follow up one week  our office.     ___________________________________________                                         Tilda Burrow, M.D.   JVF/MEDQ  D:  07/02/2004  T:  07/02/2004  Job:  [id number]

## 2011-03-15 NOTE — Discharge Summary (Signed)
[name], [name]                ACCOUNT NO.:  [id number]   MEDICAL RECORD NO.:  [id number]          PATIENT TYPE:  INP   LOCATION:  A403                          FACILITY:  [facility]   PHYSICIAN:  Tilda Burrow, M.D. DATE OF BIRTH:  [birth year]   DATE OF ADMISSION:  10/17/2005  DATE OF DISCHARGE:  12/24/2006LH                                 DISCHARGE SUMMARY   ADMISSION DIAGNOSES:  Pregnancy 38-1/[redacted] weeks gestation, marked prior  cesarean section after trial of labor, marked edema of abdomen requesting  wide excision of cicatrix, one auto antibodies as determined by [employer].   DISCHARGE DIAGNOSES:  Pregnancy 38-1/[redacted] weeks gestation, marked prior  cesarean section after trial of labor, marked edema of abdomen requesting  wide excision of cicatrix, one auto antibodies as determined by [employer],  delivered.   PROCEDURES:  Repeat low transverse cervical cesarean section, wide excision  of cicatrix performed by Tilda Burrow, October 17, 2005.   DISCHARGE MEDICATIONS:  1.  Tylox 1-2 q.6 h p.r.n. pain.  2.  Motrin 800 mg q.8 h p.r.n. pain.  3.  Methyldopa 500 mg p.o. b.i.d.   FOLLOWUP:  Follow up in one week in our office.   HOSPITAL SUMMARY:  This is a 34 year old female gravida 2, para 1, AB 0 with  13 prenatal visits was admitted for repeat cesarean section.  The patient  was morbidly obese at 5 feet 6 inches, weight 298 pounds with a large  panniculus significantly edematous, and just above the old C-section scar  which was in her abdominal skin crease and prone to chronic moisture  changes.  She underwent repeat cesarean section and wide excision of  cicatrix as described in the operative note.  Procedure was technically  challenging due to the lower abdominal contours, even with excision of a  huge ellipse of skin and underlying fatty tissue.   The patient's postoperative hemoglobin was stable at 11.0 compared to 11.7  on preop.   ASSESSMENT:  She was afebrile,  delivered a healthy infant.  Apgars 6 and 9.  Weight 8 pounds 5 ounces.  The infant showed some initial lack of tone which  was dealt with nicely by Dr. Webb Laws care.  See his notes for further  details.  The patient was stable for discharge on October 20, 2005, for  follow up in 5 days in our office at [facility].      Tilda Burrow, M.D.  Electronically Signed     JVF/MEDQ  D:  10/31/2005  T:  11/01/2005  Job:  [id number]

## 2011-03-15 NOTE — Op Note (Signed)
NAMEHONI, NAME                ACCOUNT NO.:  [id number]   MEDICAL RECORD NO.:  [id number]          PATIENT TYPE:  INP   LOCATION:  A403                          FACILITY:  [facility]   PHYSICIAN:  Tilda Burrow, M.D. DATE OF BIRTH:  01-01-1976   DATE OF PROCEDURE:  DATE OF DISCHARGE:  10/20/2005                                 OPERATIVE REPORT   PREOPERATIVE DIAGNOSES:  1.  Pregnancy at 38-1/[redacted] weeks gestation, prior cesarean section after a      trial of labor.  2.  Marked edema above old cesarean section, for wide excision of cicatrix.  3.  Warm autoantibodies on [employer] blood analysis.   POSTOPERATIVE DIAGNOSIS:  1.  Pregnancy at 38-1/[redacted] weeks gestation, prior cesarean section after a      trial of labor.  2.  Marked edema above old cesarean section, for wide excision of cicatrix.  3.  Warm autoantibodies on [employer] blood analysis.   PROCEDURE:  Repeat low transverse cervical cesarean section, wide excision  of cicatrix.   SURGEON:  Tilda Burrow, M.D.   ASSISTANT:  None.   ANESTHESIA:  Spinal.   COMPLICATIONS:  None.   FINDINGS:  Marked obesity with technically challenging lower abdominal  contours.   DESCRIPTION OF PROCEDURE:  The patient was taken to the operating room,  prepped and draped for lower abdominal surgery with spinal anesthesia  introduced.  The old scar was beneath a rather huge edematous panniculus.  An ellipse was marked off for excision.  We proceeded to excise the ellipse  of skin, measuring approximately 15 cm x transverse width x 15 cm vertical  width x 35 transverse width.  This was excised down to just above the  fascia.  We then made a transverse nick in the fascia, opened it in standard  method of Pfannenstiel.  Transverse lower incision is opened, releasing the  fascia from the underlying musculature and opening the peritoneal cavity in  the midline.  A bladder flap was developed with some technical challenges  due to the fibrosis  from prior cesarean.  The fetal vertex was guided in the  lower uterine segment transverse incision and guided without difficulty.  Apgars were signed by Dr. Milford Cage.  See his notes for details regarding the  baby.   The placenta was delivered.  Cord blood samples were obtained and placenta  closed in single layer, running, locking closure.  The bladder flap was  reapproximated.   The anterior peritoneum was closed with 2-0 chromic.  The fascia was trimmed  sufficiently to result in good cosmetic approximation.  Then the patient had  reapproximation of the skin edges.  It required excising a midline ellipse  of underlying fatty tissue to get the skin edges to approximate well, but  this resulted in a nice closure of the potential space with a flat Al Pimple drain left just above the fascia.  Skin edges were reapproximated with  subcutaneous interrupted 2-0 plain sutures and staple closure of the skin  completed at the procedure.  The patient went to the recovery room  in good  condition.  Sponge and needle counts correct.      Tilda Burrow, M.D.  Electronically Signed     JVF/MEDQ  D:  10/23/2005  T:  10/23/2005  Job:  [id number]   cc:   Francoise Schaumann. Milford Cage DO, FAAP  Fax: [phone]

## 2011-03-15 NOTE — H&P (Signed)
NAME:  [name], [name]                    ACCOUNT NO.:  [id number]   MEDICAL RECORD NO.:  [id number]                   PATIENT TYPE:  INP   LOCATION:  LDR1                                 FACILITY:  [facility]   PHYSICIAN:  Lazaro Arms, M.D.                DATE OF BIRTH:  [birth year]   DATE OF ADMISSION:  06/11/2004  DATE OF DISCHARGE:                                HISTORY & PHYSICAL   HISTORY OF PRESENT ILLNESS:  [name] is a 35 year old African-American  female, gravida 1, para 0, estimated date of delivery of June 18, 2004,  currently [redacted] weeks gestation.  The patient suffers with chronic  hypertension, and during this pregnancy has been on Aldomet 500 mg twice  daily.  Her pregnancy has otherwise been uncomplicated.  We began NST twice  weekly at 36 weeks and they have been reassuring.  All other 24-hour urine  baselines were also normal.  She did have an elevated Down syndrome risk of  1:150.  The ultrasound at [facility] was normal, and she opted not to have an  amniocentesis done.  She is admitted with an unfavorable cervix for cervical  ripening and induction of labor at 39 weeks for chronic hypertension.   PAST MEDICAL HISTORY:  1. Hypertension.  2. Cervical disk bulging.   PAST SURGICAL HISTORY:  Negative.   PAST OBSTETRICAL HISTORY:  She is nulliparous.   ALLERGIES:  VICODIN causes increased heart rate.   SOCIAL HISTORY:  She is married.  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   FAMILY HISTORY:  Significant for hypertension, diabetes, breast cancer, and  bone cancer.   Blood type is O negative.  Rubella is immune.  Antibody screen is negative.  Serology is nonreactive.  HIV is negative.  Hepatitis B is negative.  Pap  smear was normal.  GC and Chlamydia were both negative, both in the first  trimester and at 36 weeks.  Her group B strep culture was negative.  She had  an elevated 1-hour Glucola, but her 3-hour GTT was normal.  Her HSV cultures  at 28 weeks  were negative.   PHYSICAL EXAMINATION:  VITAL SIGNS:  Blood pressure was 130/80, weight 306  pounds.  HEENT:  Unremarkable.  Thyroid was normal.  LUNGS:  Clear.  HEART:  Regular rhythm without murmur, rub, or gallop.  BREASTS:  Deferred.  ABDOMEN:  The last fundal height in the office was 43 cm.  The cervix was  long, thick, and closed.  Soft posterior, -2 station, vertex.  EXTREMITIES:  Warm with 2+ edema.   IMPRESSION:  1. Intrauterine pregnancy at [redacted] weeks gestation.  2. Chronic hypertension.  3. Unfavorable cervix.  4. Morbid obesity.   PLAN:  The patient is admitted for cervical ripening with Cervidil followed  by Pitocin induction of labor.  The patient understands the reasons for  admission, and will proceed.     ___________________________________________  Lazaro Arms, M.D.   Loraine Maple  D:  06/11/2004  T:  06/11/2004  Job:  [id number]

## 2011-03-15 NOTE — Op Note (Signed)
NAME:  [name], [name]                    ACCOUNT NO.:  [id number]   MEDICAL RECORD NO.:  [id number]                   PATIENT TYPE:  INP   LOCATION:  LDR1                                 FACILITY:  [facility]   PHYSICIAN:  Tilda Burrow, M.D.              DATE OF BIRTH:  10-12-1976   DATE OF PROCEDURE:  06/12/2004  DATE OF DISCHARGE:                                 OPERATIVE REPORT   PREOPERATIVE DIAGNOSES:  1. Pregnancy 38-1/[redacted] weeks gestation.  2. Chronic hypertension.  3. Medical induction of labor.  4. Uncertain fetal status.   POSTOPERATIVE DIAGNOSES:  1. Pregnancy 38-1/[redacted] weeks gestation.  2. Chronic hypertension.  3. Medical induction of labor.  4. Uncertain fetal status.  5. Persistent occiput posterior presentation.  6. Nuchal cord x1.   PROCEDURE:  Primary low transverse cervical cesarean section.   SURGEON:  Tilda Burrow, M.D.   ASSISTANTAsencion Noble, CST-FA   ANESTHESIA:  Spinal - Idacavage, CRNA   COMPLICATIONS:  None.   FINDINGS:  An 8 pound 13 ounce female infant.  Persistent occiput posterior  presentation of vertex.  Nuchal cord x1.   DETAILS OF PROCEDURE:  The patient was taken to the operating room and  prepped and draped for a lower abdominal surgery and spinal anesthesia  introduced. The generous thicker lower abdominal obesity made for  technically challenging procedure.  We made an incision 2 cm above the  panniculus crease, extended it transversely approximately 25 cm with  development of the incision in standard Pfannenstiel technique with bladder  flap developed on the lower uterine segment.  Transverse incision was made  in the uterus at the level of the fetal shin with the vertex in a persistent  occiput posterior presentation.  It was rotated in the incision. There was a  loop of cord that protruded through the incision even before the head  delivered.  This was the nuchal cord x1.  Vacuum assistance was used to  cautiously guide the  vertex through the incision with fundal pressure being  the primary propulsive force.  Once the head was out, we abandoned use of  the vacuum extractor and relied entirely on axillary traction and fundal  pressure.  The infant was delivered smoothly, but significant technical  difficulties despite the patient's body habitus.  The cord was clamped, the  infant placed in the care of Dr.  Milinda Cave, see his notes for further details  on the baby's care.   Cord blood gases were obtained and the placenta delivered [facility]  presentation, membranes apparently intact.  Antibiotic irrigation of the  uterine cavity was followed by a single layer of running, locking closure of  the uterine incision, with 2-0 chromic closure of the bladder flap.   Irrigation of the anterior abdominal wall and removal of a laparotomy tape  was followed by closure of the anterior peritoneum with 2-0 chromic, closure  of the fascia with continuous running #0  Vicryl sewing from each angle into  the midline.  We then reapproximated the subcu fatty tissues with 3  interrupted sutures of 2-0 plain and then placed a subcutaneous flat, JP  drain just above the fascia and closed the skin with staples.  A JP drain  was allowed to exit through the right corner of the incision.  Estimated  blood loss 500 cc.  Procedure tolerated well by Kathrynn Speed.      ___________________________________________                                            Tilda Burrow, M.D.   JVF/MEDQ  D:  06/12/2004  T:  06/12/2004  Job:  [id number]   cc:   Tilda Burrow, M.D.  [address] [place]  [place] [address]  Fax: [phone]   Jeoffrey Massed, M.D.  [address]  [place]  [place] [address]  Fax: [phone]

## 2011-03-15 NOTE — H&P (Signed)
[name], [name]                ACCOUNT NO.:  [id number]   MEDICAL RECORD NO.:  [id number]          PATIENT TYPE:  AMB   LOCATION:  DAY                           FACILITY:  [facility]   PHYSICIAN:  Tilda Burrow, M.D. DATE OF BIRTH:  07/09/1976   DATE OF ADMISSION:  10/17/2005  DATE OF DISCHARGE:  LH                                HISTORY & PHYSICAL   ADMISSION DIAGNOSES:  1.  Pregnancy at 38-1/2 weeks' gestation.  2.  Prior cesarean section, not for trial of labor.  3.  Marked edema above old cesarean section, schedule for repeat cesarean      section and wide excision of cicatrix.  4.  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   HISTORY OF PRESENT ILLNESS:  This 35 year old female, G2, P1, AB0 followed  through pregnancy for 13 prenatal visits with appropriate weight gain and  fundal height growth.  [name] has been on Aldomet 500 mg b.i.d. with  excellent blood pressure in the 120-140/70-80 range.  She has had excellent  fetal growth and fetal activity.  She has scheduled a repeat cesarean  section this time.  She had a prior cesarean section in 2005, delivering an  8 pound 13 ounce infant.  She has had half days for the past few weeks.  Prior pregnancy was noted for induction of labor that failed and resulted in  cesarean delivery for persistent occipitoposterior presentation of the  vertex resulting in cephalopelvic disproportion.   PAST MEDICAL HISTORY:  1.  Preeclampsia.  2.  Chronic hypertension.   PAST SURGICAL HISTORY:  Cesarean section in 2005.   ALLERGIES:  VICODIN.   MEDICATIONS:  Aldomet 500 mg b.i.d.   PRENATAL LABORATORY DATA:  Blood type O negative with atypical antibodies on  initial lab status with extensive testing and is found to be negative for  antibodies.  She was found to have after extensive testing by the [facility]  to have a warm autoantibody.  This autoantibody stated that compatible  cross matches may be obtained using random donor red  cells.  If transfusion  is indicated, the use of enhancement media may be helpful in obtaining  compatible cross matches.  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   PHYSICAL EXAMINATION:  VITAL SIGNS:  Height 5 feet 6 inches, weight 298  which is a 12 pound weight gain.  Blood pressure 130/80.  HEENT:  Pupils equal round and reactive to light.  CARDIAC:  Unremarkable.  ABDOMEN:  A 43 cm fundal height.  Lower abdomen with marked edema with a  large area of panniculus just above the old C-section scar.  PELVIC:  External genitalia is normal, multiparous.   LABORATORY DATA AND X-RAY FINDINGS:  Additional labs from the pregnancy  include hepatitis, HIV, RPR, GC and Chlamydia all negative with 1-hour  glucose tolerance test.   PLAN:  Repeat cesarean section with wide excision of cicatrix on October 17, 2005.      [name].  [name], M.D.  Electronically Signed     JVF/MEDQ  D:  10/15/2005  T:  10/15/2005  Job:  [id number]   cc:   Francoise Schaumann. Halford Chessman  Fax: [phone]   [facility]

## 2011-04-23 ENCOUNTER — Emergency Department (HOSPITAL_COMMUNITY)
Admission: EM | Admit: 2011-04-23 | Discharge: 2011-04-23 | Disposition: A | Discharge status: Home / Self Care | Payer: BC Managed Care – PPO | Attending: Emergency Medicine | Admitting: Emergency Medicine

## 2011-04-23 DIAGNOSIS — I10 Essential (primary) hypertension: Secondary | ICD-10-CM | POA: Insufficient documentation

## 2011-04-23 DIAGNOSIS — H109 Unspecified conjunctivitis: Secondary | ICD-10-CM | POA: Insufficient documentation

## 2011-06-12 ENCOUNTER — Encounter (INDEPENDENT_AMBULATORY_CARE_PROVIDER_SITE_OTHER): Payer: Self-pay | Admitting: General Surgery

## 2011-06-13 ENCOUNTER — Ambulatory Visit (INDEPENDENT_AMBULATORY_CARE_PROVIDER_SITE_OTHER): Payer: BC Managed Care – PPO | Admitting: General Surgery

## 2011-06-13 DIAGNOSIS — Z4651 Encounter for fitting and adjustment of gastric lap band: Secondary | ICD-10-CM

## 2011-06-13 NOTE — Patient Instructions (Signed)
Exercise for 30-40 minutes 4 times per week. Stay on a liquid diet today then resume her normal diet in the morning. All for questions or concerns.

## 2011-06-13 NOTE — Progress Notes (Signed)
Patient returns for followup for lap band placed August 21, 2010. At her last visit on Mar 08, 2011 she had appropriate weight loss and restriction. She now feels that she is eating much more with minimal restriction. She has no symptoms of over restriction. She also has falloff her exercise a little bit but is ready to resume this.  On examination her weight is essentially stable up a 1 pound 4 total weight loss of 46 pounds since surgery.  With this information we went ahead with a 0.5 cc filled today to bring her up to 7.5 cc. Were reviewed diet and exercise strategies. She was able to tolerate her water well. She will return in 2 months.

## 2011-07-03 ENCOUNTER — Encounter: Payer: Self-pay | Admitting: Family Medicine

## 2011-07-04 ENCOUNTER — Ambulatory Visit: Payer: BC Managed Care – PPO | Admitting: Family Medicine

## 2011-07-04 ENCOUNTER — Encounter: Payer: Self-pay | Admitting: Family Medicine

## 2011-07-18 LAB — COMPREHENSIVE METABOLIC PANEL
Albumin: 3.4 — ABNORMAL LOW
BUN: 11
Creatinine, Ser: 0.88
Glucose, Bld: 98
Total Protein: 6.9

## 2011-07-18 LAB — URINALYSIS, ROUTINE W REFLEX MICROSCOPIC
Bilirubin Urine: NEGATIVE
Hgb urine dipstick: NEGATIVE
Nitrite: NEGATIVE
Protein, ur: NEGATIVE
Specific Gravity, Urine: 1.019
Urobilinogen, UA: 0.2

## 2011-07-18 LAB — PREGNANCY, URINE: Preg Test, Ur: NEGATIVE

## 2011-07-18 LAB — HEMOGLOBIN AND HEMATOCRIT, BLOOD
HCT: 38.7
Hemoglobin: 13.1

## 2011-07-25 ENCOUNTER — Encounter: Payer: Self-pay | Admitting: Family Medicine

## 2011-07-25 ENCOUNTER — Ambulatory Visit (INDEPENDENT_AMBULATORY_CARE_PROVIDER_SITE_OTHER): Payer: BC Managed Care – PPO | Admitting: Family Medicine

## 2011-07-25 VITALS — BP 156/100 | HR 98 | Resp 16 | Ht 62.0 in | Wt 243.1 lb

## 2011-07-25 DIAGNOSIS — G4733 Obstructive sleep apnea (adult) (pediatric): Secondary | ICD-10-CM

## 2011-07-25 DIAGNOSIS — E785 Hyperlipidemia, unspecified: Secondary | ICD-10-CM

## 2011-07-25 DIAGNOSIS — R5383 Other fatigue: Secondary | ICD-10-CM

## 2011-07-25 DIAGNOSIS — Z13820 Encounter for screening for osteoporosis: Secondary | ICD-10-CM

## 2011-07-25 DIAGNOSIS — I10 Essential (primary) hypertension: Secondary | ICD-10-CM

## 2011-07-25 DIAGNOSIS — J309 Allergic rhinitis, unspecified: Secondary | ICD-10-CM

## 2011-07-25 DIAGNOSIS — Z23 Encounter for immunization: Secondary | ICD-10-CM

## 2011-07-25 DIAGNOSIS — R5381 Other malaise: Secondary | ICD-10-CM

## 2011-07-25 MED ORDER — TRIAMTERENE-HCTZ 37.5-25 MG PO TABS: 1.0000 | ORAL_TABLET | Freq: Every day | ORAL | Status: DC

## 2011-07-25 MED ORDER — FLUTICASONE PROPIONATE 50 MCG/ACT NA SUSP: 2.0000 | Freq: Every day | NASAL | Status: DC

## 2011-07-25 MED ORDER — CETIRIZINE HCL 10 MG PO CHEW: 10.0000 mg | CHEWABLE_TABLET | Freq: Every day | ORAL | Status: DC

## 2011-07-25 NOTE — Patient Instructions (Addendum)
CPE in  10 weeks  Please start  Calorie counting  If possible and restrict to between and 1500 calories daily.  Please try to exercise 3 days per week at least.   Please get fasting labs tomorrow.  New additional medication for blood pressure which is too high, continue amlodipine.  New medication sent for allergies , flonase  Flu vaccine today.  Congrats on 15 pound weight loss  Goal of 3 pounds per month

## 2011-07-26 ENCOUNTER — Encounter: Payer: Self-pay | Admitting: Family Medicine

## 2011-07-26 LAB — BASIC METABOLIC PANEL
BUN: 10 mg/dL (ref 6–23)
CO2: 22 mEq/L (ref 19–32)
Chloride: 107 mEq/L (ref 96–112)
Glucose, Bld: 87 mg/dL (ref 70–99)
Potassium: 3.8 mEq/L (ref 3.5–5.3)
Sodium: 140 mEq/L (ref 135–145)

## 2011-07-26 LAB — LIPID PANEL
Cholesterol: 209 mg/dL — ABNORMAL HIGH (ref 0–200)
LDL Cholesterol: 159 mg/dL — ABNORMAL HIGH (ref 0–99)
Total CHOL/HDL Ratio: 5.4 Ratio
VLDL: 11 mg/dL (ref 0–40)

## 2011-07-26 LAB — CBC WITH DIFFERENTIAL/PLATELET
Basophils Absolute: 0 10*3/uL (ref 0.0–0.1)
Basophils Relative: 0 % (ref 0–1)
Eosinophils Absolute: 0.1 10*3/uL (ref 0.0–0.7)
Eosinophils Relative: 2 % (ref 0–5)
Lymphs Abs: 2.6 10*3/uL (ref 0.7–4.0)
MCH: 28.7 pg (ref 26.0–34.0)
MCV: 86.4 fL (ref 78.0–100.0)
Neutrophils Relative %: 55 % (ref 43–77)
Platelets: 202 10*3/uL (ref 150–400)
RBC: 4.63 MIL/uL (ref 3.87–5.11)
RDW: 13.5 % (ref 11.5–15.5)
WBC: 7.3 10*3/uL (ref 4.0–10.5)

## 2011-07-26 LAB — HEMOGLOBIN A1C: Hgb A1c MFr Bld: 5.7 % — ABNORMAL HIGH (ref <5.7)

## 2011-07-26 LAB — HEPATIC FUNCTION PANEL
ALT: 9 U/L (ref 0–35)
Bilirubin, Direct: 0.1 mg/dL (ref 0.0–0.3)
Indirect Bilirubin: 0.5 mg/dL (ref 0.0–0.9)
Total Bilirubin: 0.6 mg/dL (ref 0.3–1.2)

## 2011-07-26 NOTE — Assessment & Plan Note (Signed)
Improved. Pt applauded on succesful weight loss through lifestyle change, and encouraged to continue same. Weight loss goal set for the next several months.  

## 2011-07-26 NOTE — Assessment & Plan Note (Signed)
rept labs due, low fat diet discussed and encouraged

## 2011-07-26 NOTE — Assessment & Plan Note (Signed)
Medication compliance addressed. Commitment to regular exercise and healthy  food choices, with portion control discussed. DASH diet and low fat diet discussed and literature offered. Changes in medication made at this visit.  

## 2011-07-26 NOTE — Assessment & Plan Note (Signed)
Resolved with weight loss, pt states no longer needs/uses cpap

## 2011-07-26 NOTE — Assessment & Plan Note (Signed)
Anticipate upcoming flare, flonase added

## 2011-07-26 NOTE — Progress Notes (Signed)
  Subjective:    Patient ID: [name], female    DOB: 1976/06/18, 35 y.o.   MRN: [id number]  HPI The PT is here for follow up and re-evaluation of chronic medical conditions, medication management and review of any available recent lab and radiology data.  Preventive health is updated, specifically  Cancer screening and Immunization.   Questions or concerns regarding consultations or procedures which the PT has had in the interim are  addressed. The PT denies any adverse reactions to current medications since the last visit.  There are no new concerns.  She is slowly losing weight , and reports fairly good dietary control, however, based on her schedule as a busy Mom, she has little time to commit to regular exercise, and this is not good for her overall health and is slowing the weight loss process.She is working on changing some things to improve this      Review of Systems See HPI Denies recent fever or chills. Denies sinus pressure, nasal congestion, ear pain or sore throat. Anticipates allergy flare and requests flonase Denies chest congestion, productive cough or wheezing. Denies chest pains, palpitations and leg swelling Denies abdominal pain, nausea, vomiting,diarrhea or constipation.   Denies dysuria, frequency, hesitancy or incontinence. Denies joint pain, swelling and limitation in mobility. Denies headaches, seizures, numbness, or tingling. Denies depression, anxiety or insomnia. Denies skin break down or rash.        Objective:   Physical Exam Patient alert and oriented and in no cardiopulmonary distress.  HEENT: No facial asymmetry, EOMI, no sinus tenderness,  oropharynx pink and moist.  Neck supple no adenopathy.  Chest: Clear to auscultation bilaterally.  CVS: S1, S2 no murmurs, no S3.  ABD: Soft non tender. Bowel sounds normal.  Ext: No edema  MS: Adequate ROM spine, shoulders, hips and knees.  Skin: Intact, no ulcerations or rash  noted.  Psych: Good eye contact, normal affect. Memory intact not anxious or depressed appearing.  CNS: CN 2-12 intact, power, tone and sensation normal throughout.        Assessment & Plan:

## 2011-07-29 LAB — VITAMIN D 1,25 DIHYDROXY: Vitamin D 1, 25 (OH)2 Total: 69 pg/mL (ref 18–72)

## 2011-07-29 MED ORDER — INFLUENZA VAC TYPES A & B PF IM SUSP: 0.5000 mL | Freq: Once | INTRAMUSCULAR | Status: DC

## 2011-07-29 NOTE — Progress Notes (Signed)
Addended by: Adella Hare B on: 07/29/2011 08:21 AM   Modules accepted: Orders

## 2011-08-08 LAB — DIFFERENTIAL
Basophils Absolute: 0.1
Eosinophils Relative: 2
Lymphocytes Relative: 36
Monocytes Relative: 8
Neutrophils Relative %: 53

## 2011-08-08 LAB — COMPREHENSIVE METABOLIC PANEL
AST: 18
BUN: 10
CO2: 30
Calcium: 9.7
Creatinine, Ser: 1.09
GFR calc Af Amer: >60
GFR calc non Af Amer: 59 — ABNORMAL LOW

## 2011-08-08 LAB — URINALYSIS, ROUTINE W REFLEX MICROSCOPIC
Glucose, UA: NEGATIVE
Hgb urine dipstick: NEGATIVE
Protein, ur: NEGATIVE
Specific Gravity, Urine: >1.03 — ABNORMAL HIGH

## 2011-08-08 LAB — AMYLASE: Amylase: 82

## 2011-08-08 LAB — CBC
Hemoglobin: 14.3
Platelets: 193
RDW: 13.6
WBC: 9.1

## 2011-08-13 ENCOUNTER — Telehealth: Payer: Self-pay

## 2011-08-13 LAB — BASIC METABOLIC PANEL
Chloride: 108
GFR calc Af Amer: >60
GFR calc non Af Amer: 60 — ABNORMAL LOW
Potassium: 3.7
Sodium: 141

## 2011-08-13 LAB — CBC
HCT: 31.4 — ABNORMAL LOW
MCV: 85.5
RBC: 3.68 — ABNORMAL LOW
WBC: 8.6

## 2011-08-13 LAB — DIFFERENTIAL
Basophils Relative: 0
Eosinophils Relative: 2
Lymphs Abs: 2.7
Monocytes Absolute: 0.3
Monocytes Relative: 3

## 2011-08-13 LAB — CK TOTAL AND CKMB (NOT AT ARMC): Total CK: 113

## 2011-08-13 NOTE — Progress Notes (Signed)
Called patient on cell phone and left message to call back

## 2011-08-13 NOTE — Progress Notes (Signed)
Unable to reach patient by phone; letter sent

## 2011-08-23 NOTE — Telephone Encounter (Signed)
Lm for patient to call

## 2011-08-26 ENCOUNTER — Encounter (INDEPENDENT_AMBULATORY_CARE_PROVIDER_SITE_OTHER): Payer: Self-pay | Admitting: General Surgery

## 2011-09-10 ENCOUNTER — Encounter (INDEPENDENT_AMBULATORY_CARE_PROVIDER_SITE_OTHER): Payer: BC Managed Care – PPO | Admitting: General Surgery

## 2011-09-27 ENCOUNTER — Telehealth: Payer: Self-pay | Admitting: Family Medicine

## 2011-09-27 ENCOUNTER — Encounter: Payer: Self-pay | Admitting: Family Medicine

## 2011-09-27 NOTE — Telephone Encounter (Signed)
Thinks she has bronchitis, heavy chested and sore throat. Has been coughing up yellow phlegm. Feels cold. Wants to know if there is anything you can call in for her. If not, I will advise urgent care

## 2011-09-27 NOTE — Telephone Encounter (Signed)
Patient already has tessalon perles and will take them. Wants work in mon

## 2011-09-27 NOTE — Telephone Encounter (Signed)
tesalon perles 100mg  1 three times daily, fluids , rest, if over 3 days and getting worse needs to go to urgebnt care or will see her as work in on Monday, send in the tessalon perles if she wishes pls

## 2011-09-30 ENCOUNTER — Ambulatory Visit: Payer: BC Managed Care – PPO | Admitting: Family Medicine

## 2011-10-02 ENCOUNTER — Encounter: Payer: Self-pay | Admitting: Family Medicine

## 2011-10-03 ENCOUNTER — Encounter: Payer: Self-pay | Admitting: Family Medicine

## 2011-10-03 ENCOUNTER — Other Ambulatory Visit (HOSPITAL_COMMUNITY)
Admission: RE | Admit: 2011-10-03 | Discharge: 2011-10-03 | Disposition: A | Discharge status: Home / Self Care | Payer: BC Managed Care – PPO | Source: Ambulatory Visit | Attending: Family Medicine | Admitting: Family Medicine

## 2011-10-03 ENCOUNTER — Ambulatory Visit (INDEPENDENT_AMBULATORY_CARE_PROVIDER_SITE_OTHER): Payer: BC Managed Care – PPO | Admitting: Family Medicine

## 2011-10-03 VITALS — BP 140/90 | HR 60 | Resp 16 | Ht 62.0 in | Wt 236.0 lb

## 2011-10-03 DIAGNOSIS — Z0000 Encounter for general adult medical examination without abnormal findings: Secondary | ICD-10-CM

## 2011-10-03 DIAGNOSIS — J209 Acute bronchitis, unspecified: Secondary | ICD-10-CM

## 2011-10-03 DIAGNOSIS — Z01419 Encounter for gynecological examination (general) (routine) without abnormal findings: Secondary | ICD-10-CM | POA: Insufficient documentation

## 2011-10-03 DIAGNOSIS — I10 Essential (primary) hypertension: Secondary | ICD-10-CM

## 2011-10-03 DIAGNOSIS — Z124 Encounter for screening for malignant neoplasm of cervix: Secondary | ICD-10-CM

## 2011-10-03 DIAGNOSIS — Z5689 Other problems related to employment: Secondary | ICD-10-CM

## 2011-10-03 DIAGNOSIS — J0100 Acute maxillary sinusitis, unspecified: Secondary | ICD-10-CM

## 2011-10-03 DIAGNOSIS — E669 Obesity, unspecified: Secondary | ICD-10-CM

## 2011-10-03 DIAGNOSIS — J40 Bronchitis, not specified as acute or chronic: Secondary | ICD-10-CM

## 2011-10-03 DIAGNOSIS — Z566 Other physical and mental strain related to work: Secondary | ICD-10-CM

## 2011-10-03 MED ORDER — PROMETHAZINE-DM 6.25-15 MG/5ML PO SYRP: ORAL_SOLUTION | ORAL | Status: DC

## 2011-10-03 MED ORDER — PENICILLIN V POTASSIUM 500 MG PO TABS: 500.0000 mg | ORAL_TABLET | Freq: Three times a day (TID) | ORAL | Status: AC

## 2011-10-03 MED ORDER — CEFTRIAXONE SODIUM 500 MG IJ SOLR
500.0000 mg | Freq: Once | INTRAMUSCULAR | Status: AC
  Administered 2011-10-03: 500 mg via INTRAMUSCULAR

## 2011-10-03 NOTE — Patient Instructions (Signed)
F/u in 4 months.  Rocephin in office today, and penicillin and cough suppressant have been sent to your phaermacy.  Please consider the changes we discussed re your schedule to resume exercise and also to go foor counselling.  Work excuse11/29 and 11/30 and for this morning  Congrats on weight loss, and [employer]

## 2011-10-03 NOTE — Progress Notes (Signed)
  Subjective:    Patient ID: [name], female    DOB: 07/08/1976, 35 y.o.   MRN: [id number]  HPI 1 week h/o head and chest congestion weak tired fever and chills, sputum is yellow. Unable to work last week Thursday and Friday, needs this morning as work excuse also. Pt repoprts excessive stress ion the job due to responsibilities as well as due to the fact that her assistant is not very helpful and also the children are very needy. Cries at the end of every work day, all classes go well up until the last class, stressed, had anxiety attack last week , wondering about xanax but not keen. Has been careful with diet, no regular exercise, has steadily been losing weight. Here for annual exam   Review of Systems See HPI  Denies chest pains, palpitations and leg swelling Denies abdominal pain, nausea, vomiting,diarrhea or constipation.   Denies dysuria, frequency, hesitancy or incontinence. Denies joint pain, swelling and limitation in mobility. Denies headaches, seizures, numbness, or tingling.  Denies skin break down or rash.        Objective:   Physical Exam  Pleasant well nourished female, alert and oriented x 3, in no cardio-pulmonary distress. Afebrile. HEENT No facial trauma or asymetry. Sinuses  tender.  EOMI, PERTL, fundoscopic exam  no hemorhage or exudate.  External ears normal, tympanic membranes clear. Oropharynx moist, no exudate, good dentition. Neck: supple, no adenopathy,JVD or thyromegaly.No bruits.  Chest: Clear decreased air entry, scattered  crackles and  wheezes. Non tender to palpation  Breast: No asymetry,no masses. No nipple discharge or inversion. No axillary or supraclavicular adenopathy  Cardiovascular system; Heart sounds normal,  S1 and  S2 ,no S3.  No murmur, or thrill. Apical beat not displaced Peripheral pulses normal.  Abdomen: Soft, non tender, no organomegaly or masses. No bruits. Bowel sounds normal. No guarding,  tenderness or rebound.  Rectal:  deferred  GU: External genitalia normal. No lesions. Vaginal canal normal.No discharge. Uterus normal size, no adnexal masses, no cervical motion or adnexal tenderness.  Musculoskeletal exam: Full ROM of spine, hips , shoulders and knees. No deformity ,swelling or crepitus noted. No muscle wasting or atrophy.   Neurologic: Cranial nerves 2 to 12 intact. Power, tone ,sensation and reflexes normal throughout. No disturbance in gait. No tremor.  Skin: Intact, no ulceration, erythema , scaling or rash noted. Pigmentation normal throughout  Psych; Normal mood and affect. Judgement and concentration norma Anxious and tearful at timesl       Assessment & Plan:

## 2011-10-04 DIAGNOSIS — Z566 Other physical and mental strain related to work: Secondary | ICD-10-CM | POA: Insufficient documentation

## 2011-10-04 NOTE — Assessment & Plan Note (Signed)
Increasing stress on the job, encouraged pt to seek therapy through her employer, start regular exercise, no med at this time

## 2011-10-04 NOTE — Assessment & Plan Note (Signed)
Currently elevated, if persists will need to inc dose of amlodipine

## 2011-10-04 NOTE — Assessment & Plan Note (Signed)
Antibiotic, cough suppressant and decongestant prescribed 

## 2011-10-04 NOTE — Assessment & Plan Note (Signed)
Antibiotic prescribed 

## 2011-10-04 NOTE — Assessment & Plan Note (Signed)
Improved. Pt applauded on succesful weight loss through lifestyle change, and encouraged to continue same. Weight loss goal set for the next several months.  

## 2012-01-21 ENCOUNTER — Other Ambulatory Visit: Payer: Self-pay | Admitting: Family Medicine

## 2012-02-04 ENCOUNTER — Ambulatory Visit: Payer: BC Managed Care – PPO | Admitting: Family Medicine

## 2012-04-14 ENCOUNTER — Encounter (INDEPENDENT_AMBULATORY_CARE_PROVIDER_SITE_OTHER): Payer: Self-pay | Admitting: General Surgery

## 2012-04-16 ENCOUNTER — Encounter (INDEPENDENT_AMBULATORY_CARE_PROVIDER_SITE_OTHER): Payer: Self-pay

## 2012-04-16 ENCOUNTER — Ambulatory Visit (INDEPENDENT_AMBULATORY_CARE_PROVIDER_SITE_OTHER): Payer: BC Managed Care – PPO | Admitting: Physician Assistant

## 2012-04-16 VITALS — BP 132/74 | Ht 62.0 in | Wt 238.6 lb

## 2012-04-16 DIAGNOSIS — Z4651 Encounter for fitting and adjustment of gastric lap band: Secondary | ICD-10-CM

## 2012-04-16 NOTE — Patient Instructions (Signed)
Take clear liquids tonight. Thin protein shakes are ok to start tomorrow morning. Slowly advance your diet thereafter. Call us if you have persistent vomiting or regurgitation, night cough or reflux symptoms. Return as scheduled or sooner if you notice no changes in hunger/portion sizes.  

## 2012-04-16 NOTE — Progress Notes (Signed)
  HISTORY: [name] is a 36 y.o.female who received an AP-Standard lap-band in October 2011 by Dr. Johna Sheriff. She comes in having lost almost 13 lbs since August. She is having some increasing hunger and her portion sizes have also increased. No vomiting or difficulty swallowing.  VITAL SIGNS: Filed Vitals:   04/16/12 1025  BP: 132/74    PHYSICAL EXAM: Physical exam reveals a very well-appearing 36 y.o.female in no apparent distress Neurologic: Awake, alert, oriented Psych: Bright affect, conversant Respiratory: Breathing even and unlabored. No stridor or wheezing Abdomen: Soft, nontender, nondistended to palpation. Incisions well-healed. No incisional hernias. Port easily palpated. Extremities: Atraumatic, good range of motion.  ASSESMENT: 36 y.o.  female  s/p AP-Standard lap-band.   PLAN: The patient's port was accessed with a 20G Huber needle without difficulty. Clear fluid was aspirated and 0.25 mL saline was added to the port to give a total predicted volume of 7.75 mL. The patient was able to swallow water without difficulty following the procedure and was instructed to take clear liquids for the next 24-48 hours and advance slowly as tolerated.

## 2012-07-08 ENCOUNTER — Telehealth: Payer: Self-pay | Admitting: Family Medicine

## 2012-07-08 NOTE — Telephone Encounter (Signed)
Pt called states she had a message from PCP but could not understand it, it was from August she believes, I do not see any phone notes, last seen in Dec 2012. Will defer to PCP?? , no forms were needed by patient either.

## 2012-07-08 NOTE — Telephone Encounter (Signed)
Noted, not important

## 2012-07-16 ENCOUNTER — Encounter (INDEPENDENT_AMBULATORY_CARE_PROVIDER_SITE_OTHER): Payer: BC Managed Care – PPO

## 2012-07-22 ENCOUNTER — Encounter (INDEPENDENT_AMBULATORY_CARE_PROVIDER_SITE_OTHER): Payer: Self-pay | Admitting: Physician Assistant

## 2012-08-30 ENCOUNTER — Other Ambulatory Visit: Payer: Self-pay | Admitting: Family Medicine

## 2012-09-08 ENCOUNTER — Ambulatory Visit (INDEPENDENT_AMBULATORY_CARE_PROVIDER_SITE_OTHER): Payer: BC Managed Care – PPO | Admitting: Family Medicine

## 2012-09-08 ENCOUNTER — Encounter: Payer: Self-pay | Admitting: Family Medicine

## 2012-09-08 VITALS — BP 148/90 | HR 68 | Resp 18 | Ht 62.0 in | Wt 245.0 lb

## 2012-09-08 DIAGNOSIS — E785 Hyperlipidemia, unspecified: Secondary | ICD-10-CM

## 2012-09-08 DIAGNOSIS — R5381 Other malaise: Secondary | ICD-10-CM

## 2012-09-08 DIAGNOSIS — I10 Essential (primary) hypertension: Secondary | ICD-10-CM

## 2012-09-08 DIAGNOSIS — R5383 Other fatigue: Secondary | ICD-10-CM

## 2012-09-08 DIAGNOSIS — E669 Obesity, unspecified: Secondary | ICD-10-CM

## 2012-09-08 DIAGNOSIS — M546 Pain in thoracic spine: Secondary | ICD-10-CM | POA: Insufficient documentation

## 2012-09-08 DIAGNOSIS — Z23 Encounter for immunization: Secondary | ICD-10-CM

## 2012-09-08 MED ORDER — KETOROLAC TROMETHAMINE 60 MG/2ML IJ SOLN
60.0000 mg | Freq: Once | INTRAMUSCULAR | Status: AC
  Administered 2012-09-08: 60 mg via INTRAMUSCULAR

## 2012-09-08 MED ORDER — IBUPROFEN 800 MG PO TABS: 800.0000 mg | ORAL_TABLET | Freq: Three times a day (TID) | ORAL | Status: DC | PRN

## 2012-09-08 MED ORDER — PREDNISONE (PAK) 5 MG PO TABS: 5.0000 mg | ORAL_TABLET | ORAL | Status: DC

## 2012-09-08 MED ORDER — CLONIDINE HCL 0.1 MG PO TABS: ORAL_TABLET | ORAL | Status: DC

## 2012-09-08 MED ORDER — METHYLPREDNISOLONE ACETATE 80 MG/ML IJ SUSP
80.0000 mg | Freq: Once | INTRAMUSCULAR | Status: AC
  Administered 2012-09-08: 80 mg via INTRAMUSCULAR

## 2012-09-08 NOTE — Progress Notes (Signed)
  Subjective:    Patient ID: [name], female    DOB: 29-Jan-1976, 36 y.o.   MRN: [id number]  HPI 3 week h/o thoracic pain which pt initially felt was aggravated by menses. Non radiating.Starts at a 3 in the morning and is up to an 8 at the end of the day, stands all day 2 week h/o neck pain intermittent every 2 to 3 days, experienced tingling and soreness left forearm and ulnar hand, experienced this past Saturday, all day, no associated chest pain, nausea, light headedness, lasted overnight . Also had right  shoulder pain   Review of Systems See HPI Denies recent fever or chills. Denies sinus pressure, nasal congestion, ear pain or sore throat. Denies chest congestion, productive cough or wheezing. Denies chest pains, palpitations and leg swelling Denies abdominal pain, nausea, vomiting,diarrhea or constipation.   Denies dysuria, frequency, hesitancy or incontinence.  Denies headaches, seizures, numbness, or tingling. Denies depression, anxiety or insomnia. Denies skin break down or rash.        Objective:   Physical Exam  Patient alert and oriented and in no cardiopulmonary distress.  HEENT: No facial asymmetry, EOMI, no sinus tenderness,  oropharynx pink and moist.  Neck decreased ROM with spasm, no adenopathy.  Chest: Clear to auscultation bilaterally.Tender over thoracic spine with spasm  CVS: S1, S2 no murmurs, no S3.  ABD: Soft non tender. Bowel sounds normal.  Ext: No edema  MS: decreased  ROM thoracic  Spine,adequate in  shoulders, hips and knees.  Skin: Intact, no ulcerations or rash noted.  Psych: Good eye contact, normal affect. Memory intact not anxious or depressed appearing.  CNS: CN 2-12 intact, power, tone and sensation normal throughout.       Assessment & Plan:

## 2012-09-08 NOTE — Patient Instructions (Addendum)
CPE December 23 or after, before January 4  You will get toradol 60mg  and depo medrol 80mg  IM in the office today, and ibuprofen and prednisone is sent in for your back pain.  Xray is ordered and you are referred for physical therapy twice weekly for 2 weeks   Call if pain persists or worsens please.  Fasting cbc, chem 7, lipid, HBA1C and TSH and Vit D in December 3 to 5 days before visit.  New additional medication for blood pressure to be started tonight, blood pressure is high  Work note for this morning.  Log into computer to see results

## 2012-09-16 ENCOUNTER — Ambulatory Visit (HOSPITAL_COMMUNITY)
Admission: RE | Admit: 2012-09-16 | Discharge: 2012-09-16 | Disposition: A | Discharge status: Home / Self Care | Payer: BC Managed Care – PPO | Source: Ambulatory Visit | Attending: Family Medicine | Admitting: Family Medicine

## 2012-09-16 DIAGNOSIS — M546 Pain in thoracic spine: Secondary | ICD-10-CM | POA: Insufficient documentation

## 2012-09-16 NOTE — Assessment & Plan Note (Signed)
Uncontrolled , dose increase in medication DASH diet and commitment to daily physical activity for a minimum of 30 minutes discussed and encouraged, as a part of hypertension management. The importance of attaining a healthy weight is also discussed.  

## 2012-09-16 NOTE — Assessment & Plan Note (Signed)
Improved. Pt applauded on succesful weight loss through lifestyle change, and encouraged to continue same. Weight loss goal set for the next several months.  

## 2012-09-16 NOTE — Assessment & Plan Note (Signed)
Acute new upper neck pain, anti inflammatories and muscle relaxants, also xray and PT, pt to call back if no improvement

## 2012-09-23 ENCOUNTER — Ambulatory Visit (HOSPITAL_COMMUNITY)
Admission: RE | Admit: 2012-09-23 | Discharge: 2012-09-23 | Disposition: A | Discharge status: Home / Self Care | Payer: BC Managed Care – PPO | Source: Ambulatory Visit | Attending: Family Medicine | Admitting: Family Medicine

## 2012-09-23 DIAGNOSIS — M546 Pain in thoracic spine: Secondary | ICD-10-CM | POA: Insufficient documentation

## 2012-09-23 DIAGNOSIS — IMO0001 Reserved for inherently not codable concepts without codable children: Secondary | ICD-10-CM | POA: Insufficient documentation

## 2012-09-30 ENCOUNTER — Ambulatory Visit (HOSPITAL_COMMUNITY)
Admission: RE | Admit: 2012-09-30 | Discharge: 2012-09-30 | Disposition: A | Discharge status: Home / Self Care | Payer: BC Managed Care – PPO | Source: Ambulatory Visit | Attending: Family Medicine | Admitting: Family Medicine

## 2012-09-30 DIAGNOSIS — M546 Pain in thoracic spine: Secondary | ICD-10-CM | POA: Insufficient documentation

## 2012-09-30 DIAGNOSIS — IMO0001 Reserved for inherently not codable concepts without codable children: Secondary | ICD-10-CM | POA: Insufficient documentation

## 2012-09-30 NOTE — Progress Notes (Signed)
Physical Therapy Treatment Patient Details  Name: [name] MRN: [id number] Date of Birth: 1976/08/03  Today's Date: 09/30/2012 Time: 4098-1191 PT Time Calculation (min): 35 min Visit#: 2  of 8   Re-eval: 10/23/12 Authorization: [employer]  Charges:  therex 15' , STM 15'   Subjective: Symptoms/Limitations Symptoms: Pt. states she is just getting off work [employer]) and is hurting quite a bit today, reports pain as 5/10 today. Pain Assessment Currently in Pain?: Yes Pain Score:   5 Pain Location: Thoracic Pain Orientation: Right   Exercise/Treatments Machines for Strengthening UBE (Upper Arm Bike): 4' backward Theraband Exercises Scapula Retraction: 10 reps;Green Shoulder Extension: 10 reps;Green Rows: 10 reps;Green Seated Exercises Cervical Isometrics: Extension;Right lateral flexion;Left lateral flexion;3 secs;5 reps Neck Retraction: 5 secs;5 reps X to V: 10 reps W Back: 10 reps Prone Exercises Other Prone Exercise: POE   Manual Therapy Manual Therapy: Massage Massage: STM to R rhomboid/trap in prone lying  Physical Therapy Assessment and Plan PT Assessment and Plan Clinical Impression Statement: Added UBE and theraband postural exercises with cues for posture. Began STM to R thoracic area with 2 palpable spasms resolved after manual .  Pt. with pain reduction to 3/10 at end of session. PT Plan: Begin money exercise next visit and progress to prone stab exercises.  Continue to decrease pain/spasm with manual techniques.     Problem List Patient Active Problem List  Diagnosis  . HYPERLIPIDEMIA  . MORBID OBESITY  . OBSTRUCTIVE SLEEP APNEA  . Unspecified essential hypertension  . FATIGUE  . Stress at work  . Thoracic back pain    PT - End of Session Activity Tolerance: Patient tolerated treatment well General Behavior During Session: [facility] for tasks performed Cognition: [facility] for tasks performed PT Plan of Care PT Home Exercise Plan: given   Lurena Nida, PTA/CLT 09/30/2012, 5:52 PM

## 2012-10-01 ENCOUNTER — Ambulatory Visit (HOSPITAL_COMMUNITY): Payer: BC Managed Care – PPO

## 2012-10-02 ENCOUNTER — Ambulatory Visit (HOSPITAL_COMMUNITY)
Admission: RE | Admit: 2012-10-02 | Discharge: 2012-10-02 | Disposition: A | Discharge status: Home / Self Care | Payer: BC Managed Care – PPO | Source: Ambulatory Visit

## 2012-10-02 NOTE — Progress Notes (Signed)
Physical Therapy Treatment Patient Details  Name: [name] MRN: [id number] Date of Birth: October 14, 1976  Today's Date: 10/02/2012 Time: 1735-1820 PT Time Calculation (min): 45 min  Visit#: 3  of 8   Re-eval: 10/23/12  Charge: therex 30', STM 15'  Authorization: [employer]   Subjective: Symptoms/Limitations Symptoms: Pt reported back pain feeling better, was a little sore following last session.  Pain scale 3/10 R thoracic region (around bra spot), Pain Assessment Currently in Pain?: Yes Pain Score:   3 Pain Location: Thoracic Pain Orientation: Right  Objective:   Exercise/Treatments Machines for Strengthening UBE (Upper Arm Bike): 4' backward Theraband Exercises Scapula Retraction: 15 reps;Green Shoulder Extension: 15 reps;Green Rows: 15 reps;Green Standing Exercises Upper Extremity Flexion with Stabilization: Flexion;10 reps;Limitations UE Flexion with Stabilization Limitations: against wall with cervical retraction for imporve posture  Seated Exercises Cervical Isometrics: Extension;Right lateral flexion;Left lateral flexion;3 secs;5 reps Neck Retraction: 5 secs;5 reps X to V: 10 reps W Back: 10 reps Postural Training: sitting posture with tactile cueing top of head to activate TrA for improved posture.   Manual Therapy Manual Therapy: Massage Massage: STM to R rhomboid/trap in prone lying   Physical Therapy Assessment and Plan PT Assessment and Plan Clinical Impression Statement: Added standing and sitting postural strengthening exercises with cueing for core activation for improved posture.  Pt educated on importance of good posture throughout functional tasks (typing computer, grading tests, etc...) for good bone alignment to reduce pain.  Able to reduce multiple spasms R rhomboids with pain resolved at end of session. PT Plan: Progress to prone stability exercises next session.  Continue to decrease pain/spasms with manual techniques.    Goals    Problem  List Patient Active Problem List  Diagnosis  . HYPERLIPIDEMIA  . MORBID OBESITY  . OBSTRUCTIVE SLEEP APNEA  . Unspecified essential hypertension  . FATIGUE  . Stress at work  . Thoracic back pain    PT - End of Session Activity Tolerance: Patient tolerated treatment well General Behavior During Session: [facility] for tasks performed Cognition: [facility] for tasks performed  GP    Juel Burrow 10/02/2012, 6:29 PM

## 2012-10-07 ENCOUNTER — Ambulatory Visit (HOSPITAL_COMMUNITY)
Admission: RE | Admit: 2012-10-07 | Discharge: 2012-10-07 | Disposition: A | Discharge status: Home / Self Care | Payer: BC Managed Care – PPO | Source: Ambulatory Visit

## 2012-10-07 NOTE — Progress Notes (Signed)
Physical Therapy Treatment Patient Details  Name: JANAY CANAN MRN: [id number] Date of Birth: 17-Jul-1976  Today's Date: 10/07/2012 Time: 1735-1820 PT Time Calculation (min): 45 min  Visit#: 4  of 8   Re-eval: 10/23/12  Charge: therex 30', STM x 15'  Authorization: [employer]   Subjective: Symptoms/Limitations Symptoms: Pt reported she felt good following last session, min pain R thoracic 1/10. Pain Assessment Currently in Pain?: Yes Pain Score:   1 Pain Location: Thoracic Pain Orientation: Right  Objective:   Exercise/Treatments Machines for Strengthening UBE (Upper Arm Bike): 6' @ 1.0 backward Standing Exercises Upper Extremity Flexion with Stabilization: Flexion;10 reps;Limitations UE Flexion with Stabilization Limitations: against wall with cervical retraction for imporve posture  Seated Exercises Postural Training: sitting posture with tactile cueing top of head to activate TrA for improved posture.x 5 min Prone Exercises W Back: 15 reps Shoulder Extension: 15 reps Rows: 15 reps Other Prone Exercise: POE Serratus anterior with manual facilitation for proper technique  Manual: STM to R rhomboids/mid traps in prone position x 15'  Physical Therapy Assessment and Plan PT Assessment and Plan Clinical Impression Statement: Progress to prone therex for posture strengthening, pt able to demonstrate appropriate technique following manual facilitation for proper musculature activation.  Able to resolve 3 spams R rhomboids with manual STM, no spasms remain just noted tightness.   PT Plan: Continue with posture and core stability exercises.  Instruct mid back stretch next session.  Continue to decrease pain/spasms with manual techniques.    Goals    Problem List Patient Active Problem List  Diagnosis  . HYPERLIPIDEMIA  . MORBID OBESITY  . OBSTRUCTIVE SLEEP APNEA  . Unspecified essential hypertension  . FATIGUE  . Stress at work  . Thoracic back pain    PT - End  of Session Activity Tolerance: Patient tolerated treatment well General Behavior During Session: [facility] for tasks performed Cognition: [facility] for tasks performed  GP    Juel Burrow 10/07/2012, 6:33 PM

## 2012-10-09 ENCOUNTER — Ambulatory Visit (HOSPITAL_COMMUNITY): Payer: BC Managed Care – PPO

## 2012-10-14 ENCOUNTER — Ambulatory Visit (HOSPITAL_COMMUNITY)
Admission: RE | Admit: 2012-10-14 | Discharge: 2012-10-14 | Disposition: A | Discharge status: Home / Self Care | Payer: BC Managed Care – PPO | Source: Ambulatory Visit

## 2012-10-14 NOTE — Progress Notes (Addendum)
Physical Therapy Treatment Patient Details  Name: [name] MRN: [id number] Date of Birth: 1976/03/27  Today's Date: 10/14/2012 Time: 4098-1191 PT Time Calculation (min): 35 min Charges: 15' Manual, 20' TE Visit#: 5  of 8   Re-eval: 10/23/12    Subjective: Symptoms/Limitations Symptoms: Pt reports she is doing pretty well.  her pain has decreased overall.   Precautions/Restrictions     Exercise/Treatments Theraband Exercises Scapula Retraction: 15 reps;Green Shoulder Extension: 15 reps;Green Rows: 15 reps;Green Shoulder External Rotation: 10 reps;Green Standing Exercises Other Standing Exercises: corner press 10x10 sec holds Pro/Ret/Elv/Dep: 5 reps Prone Exercises W Back: 10 reps Shoulder Extension: 10 reps (palms down) Rows: 15 reps Other Prone Exercise: POE Serratus anterior with manual facilitation for proper technique Other Prone Exercise: Shoulder Abd x15  Manual Therapy Manual Therapy: Massage Massage: STM to R rhomboid/midtrap in prone x15 min\  Physical Therapy Assessment and Plan PT Assessment and Plan Clinical Impression Statement: Pt 10 min late today. Pt has improved posture and able to demonstrate activities with min cueing.  Added activties today to improve scapular stabilization and strength.  Continues to be limited by significant muscle spasms to rhomboid region.  reports pain 2/10 at end of session.  PT Plan: instruct on mid back stretch next session.  cont to address pain and add modalities if needed.     Goals    Problem List Patient Active Problem List  Diagnosis  . HYPERLIPIDEMIA  . MORBID OBESITY  . OBSTRUCTIVE SLEEP APNEA  . Unspecified essential hypertension  . FATIGUE  . Stress at work  . Thoracic back pain       GP    [name] 10/14/2012, 6:19 PM

## 2012-10-16 ENCOUNTER — Telehealth (HOSPITAL_COMMUNITY): Payer: Self-pay

## 2012-10-16 ENCOUNTER — Inpatient Hospital Stay (HOSPITAL_COMMUNITY): Admission: RE | Admit: 2012-10-16 | Payer: BC Managed Care – PPO | Source: Ambulatory Visit

## 2012-10-16 MED ORDER — MEPERIDINE HCL 50 MG/ML IJ SOLN
INTRAMUSCULAR | Status: AC
  Filled 2012-10-16: qty 1

## 2012-10-16 MED ORDER — MIDAZOLAM HCL 5 MG/5ML IJ SOLN
INTRAMUSCULAR | Status: AC
  Filled 2012-10-16: qty 10

## 2012-10-22 ENCOUNTER — Ambulatory Visit (HOSPITAL_COMMUNITY)
Admission: RE | Admit: 2012-10-22 | Discharge: 2012-10-22 | Disposition: A | Discharge status: Home / Self Care | Payer: BC Managed Care – PPO | Source: Ambulatory Visit | Attending: Family Medicine | Admitting: Family Medicine

## 2012-10-22 NOTE — Progress Notes (Signed)
Physical Therapy Reevaluation/Treatment  Patient Details  Name: [name] MRN: [id number] Date of Birth: [birth year]  Today's Date: 10/22/2012 Time: 1016-1100 PT Time Calculation (min): 44 min  Visit#: 6  of 8   Re-eval: 11/19/12 Assessment Diagnosis: thoracic pain Next MD Visit: Tamara Hong, MD 10/27/2012 Charge: MMT x 1unit, therex 30', manual 10'  Authorization: [employer]   Subjective Symptoms/Limitations Symptoms: Pt reported she is feeling a lot better, was sore yesterday but feeling better today.   How long can you sit comfortably?: 2 hours sitting comfortably How long can you stand comfortably?: 3-4 hours  How long can you walk comfortably?: 4-5 hours Pain Assessment Currently in Pain?: Yes Pain Score:   1 Pain Location: Thoracic Pain Orientation: Right  Cognition/Observation Observation/Other Assessments Observations: Pt sits in poor posture.  Sensation/Coordination/Flexibility/Functional Tests Functional Tests Functional Tests: oswestry 28 (was 20)  Assessment Cervical AROM Overall Cervical AROM: Within functional limits for tasks performed Cervical Strength Cervical Extension: 3+/5 (was 3/5) Cervical - Right Side Bend: 3+/5 (was 3/5) Cervical - Left Side Bend: 4/5 (was 3/5)  Exercise/Treatments Stretches Other Neck Stretches: standing mid back st 3x 30" Other Neck Stretches: quadruped child's pose 3x 30" Theraband Exercises Scapula Retraction: 15 reps;Green Shoulder Extension: 15 reps;Green Rows: 15 reps;Green Shoulder External Rotation: 15 reps;Green Standing Exercises Upper Extremity Flexion with Stabilization: Flexion;10 reps;Limitations UE Flexion with Stabilization Limitations: against wall with cervical retraction for imporve posture , alteralting UE flexion 5 reps; BUE flexion with good posture Seated Exercises Postural Training: sitting posture with tactile cueing top of head to activate TrA for improved posture.x 5 min Prone Exercises W  Back: 10 reps Shoulder Extension: 10 reps Rows: 15 reps Other Prone Exercise: POE Serratus anterior with manual facilitation for proper technique 10 reps Other Prone Exercise: Shoulder Abd x15  Manual Therapy Manual Therapy: Massage Massage: STM to R rhomboid/mid trap prone positionx 10 min  Physical Therapy Assessment and Plan PT Assessment and Plan Clinical Impression Statement: Reassessment complete prior MD apt next week.  [name] has had 6 OPPT sessions over 4 weeks and has met 1/3 STG and 0/3 LTG.  Pt has improved cervical strength by 1/2 grade and is able to verbalize appropriate posture but does require cueing to demonstrate appropiate posture during session.  Pt does continue  to required multimodal cueing for proper technique with therex for stability and correct musculature activation.  Added mid trap stretch standing and in quadruped position.  STM complete to R rhomboid and mid traps with no spasms palpated just tight musculature.  Pt stated increased activity tolerance with ability to sit, stand and walk for longer periods of time comfortablty, ODI score 28 this session was originially 20. PT Plan: Recommend continuing OPPT for 4 weeks 2 x week to address remaining deficits.  Continue to address core strengthening to imporve posture and manual/modalities to reduce pain.    Goals Home Exercise Program Pt will Perform Home Exercise Program: Independently PT Goal: Perform Home Exercise Program - Progress: Progressing toward goal PT Short Term Goals Time to Complete Short Term Goals: 2 weeks PT Short Term Goal 1: Strength to be improved 1/2 grade PT Short Term Goal 1 - Progress: Met PT Short Term Goal 2: Pain to be no more than a 3 80% of the day PT Short Term Goal 2 - Progress: Progressing toward goal (4/10 80% of day) PT Short Term Goal 3: Pt to verbalize the importance of posture in back care PT Short Term Goal 3 - Progress:  Progressing toward goal (able to verbalize  but does require cueing to demonstrate app) PT Long Term Goals Time to Complete Long Term Goals: 4 weeks PT Long Term Goal 1: I in advance HEP PT Long Term Goal 1 - Progress: Progressing toward goal PT Long Term Goal 2: Pt strength to be improved by one grade PT Long Term Goal 2 - Progress: Progressing toward goal Long Term Goal 3: Pt pain to be no more than a 1 80% of the time. Long Term Goal 3 Progress: Progressing toward goal  Problem List Patient Active Problem List  Diagnosis  . HYPERLIPIDEMIA  . MORBID OBESITY  . OBSTRUCTIVE SLEEP APNEA  . Unspecified essential hypertension  . FATIGUE  . Stress at work  . Thoracic back pain    PT - End of Session Activity Tolerance: Patient tolerated treatment well General Behavior During Session: [facility] for tasks performed Cognition: [facility] for tasks performed  GP    [name] 10/22/2012, 11:55 AM  Physician Documentation Your signature is required to indicate approval of the treatment plan as stated above.  Please sign and either send electronically or make a copy of this report for your files and return this physician signed original.   Please mark one 1.__approve of plan  2. ___approve of plan with the following conditions.   ______________________________                                                          _____________________ Physician Signature                                                                                                             Date

## 2012-10-23 LAB — HEMOGLOBIN A1C
Hgb A1c MFr Bld: 5.8 % — ABNORMAL HIGH (ref <5.7)
Mean Plasma Glucose: 120 mg/dL — ABNORMAL HIGH (ref <117)

## 2012-10-23 LAB — COMPREHENSIVE METABOLIC PANEL WITH GFR
ALT: 12 U/L (ref 0–35)
AST: 10 U/L (ref 0–37)
Albumin: 4 g/dL (ref 3.5–5.2)
Alkaline Phosphatase: 79 U/L (ref 39–117)
BUN: 13 mg/dL (ref 6–23)
CO2: 26 meq/L (ref 19–32)
Calcium: 9.5 mg/dL (ref 8.4–10.5)
Chloride: 105 meq/L (ref 96–112)
Creat: 0.96 mg/dL (ref 0.50–1.10)
Glucose, Bld: 89 mg/dL (ref 70–99)
Potassium: 4 meq/L (ref 3.5–5.3)
Sodium: 140 meq/L (ref 135–145)
Total Bilirubin: 0.4 mg/dL (ref 0.3–1.2)
Total Protein: 6.8 g/dL (ref 6.0–8.3)

## 2012-10-23 LAB — LIPID PANEL
Cholesterol: 213 mg/dL — ABNORMAL HIGH (ref 0–200)
HDL: 44 mg/dL (ref >39)
Total CHOL/HDL Ratio: 4.8 Ratio
Triglycerides: 71 mg/dL (ref <150)

## 2012-10-23 LAB — CBC
HCT: 42.3 % (ref 36.0–46.0)
Hemoglobin: 14 g/dL (ref 12.0–15.0)
MCV: 85.6 fL (ref 78.0–100.0)
RBC: 4.94 MIL/uL (ref 3.87–5.11)
RDW: 13 % (ref 11.5–15.5)
WBC: 7.8 10*3/uL (ref 4.0–10.5)

## 2012-10-23 LAB — TSH: TSH: 1.483 u[IU]/mL (ref 0.350–4.500)

## 2012-10-27 ENCOUNTER — Ambulatory Visit (INDEPENDENT_AMBULATORY_CARE_PROVIDER_SITE_OTHER): Payer: BC Managed Care – PPO | Admitting: Family Medicine

## 2012-10-27 ENCOUNTER — Other Ambulatory Visit: Payer: Self-pay | Admitting: Family Medicine

## 2012-10-27 ENCOUNTER — Other Ambulatory Visit (HOSPITAL_COMMUNITY)
Admission: RE | Admit: 2012-10-27 | Discharge: 2012-10-27 | Disposition: A | Discharge status: Home / Self Care | Payer: BC Managed Care – PPO | Source: Ambulatory Visit | Attending: Family Medicine | Admitting: Family Medicine

## 2012-10-27 ENCOUNTER — Encounter: Payer: Self-pay | Admitting: Family Medicine

## 2012-10-27 VITALS — BP 144/98 | HR 67 | Resp 16 | Ht 62.0 in | Wt 240.0 lb

## 2012-10-27 DIAGNOSIS — Z01419 Encounter for gynecological examination (general) (routine) without abnormal findings: Secondary | ICD-10-CM | POA: Insufficient documentation

## 2012-10-27 DIAGNOSIS — N760 Acute vaginitis: Secondary | ICD-10-CM | POA: Insufficient documentation

## 2012-10-27 DIAGNOSIS — E785 Hyperlipidemia, unspecified: Secondary | ICD-10-CM

## 2012-10-27 DIAGNOSIS — R5381 Other malaise: Secondary | ICD-10-CM

## 2012-10-27 DIAGNOSIS — N644 Mastodynia: Secondary | ICD-10-CM

## 2012-10-27 DIAGNOSIS — E559 Vitamin D deficiency, unspecified: Secondary | ICD-10-CM

## 2012-10-27 DIAGNOSIS — Z113 Encounter for screening for infections with a predominantly sexual mode of transmission: Secondary | ICD-10-CM | POA: Insufficient documentation

## 2012-10-27 DIAGNOSIS — E8881 Metabolic syndrome: Secondary | ICD-10-CM

## 2012-10-27 DIAGNOSIS — I10 Essential (primary) hypertension: Secondary | ICD-10-CM

## 2012-10-27 DIAGNOSIS — Z124 Encounter for screening for malignant neoplasm of cervix: Secondary | ICD-10-CM

## 2012-10-27 DIAGNOSIS — Z0000 Encounter for general adult medical examination without abnormal findings: Secondary | ICD-10-CM | POA: Insufficient documentation

## 2012-10-27 MED ORDER — CLONIDINE HCL 0.2 MG/24HR TD PTWK: 1.0000 | MEDICATED_PATCH | TRANSDERMAL | Status: DC

## 2012-10-27 MED ORDER — ERGOCALCIFEROL 1.25 MG (50000 UT) PO CAPS: 50000.0000 [IU] | ORAL_CAPSULE | ORAL | Status: DC

## 2012-10-27 NOTE — Assessment & Plan Note (Signed)
Hyperlipidemia:Low fat diet discussed and encouraged.  No meds at this time 

## 2012-10-27 NOTE — Progress Notes (Signed)
  Subjective:    Patient ID: [name], female    DOB: [birth year], 36 y.o.   MRN: [id number]  HPI The PT is here for annual exam and re-evaluation of chronic medical conditions, medication management and review of any available recent lab and radiology data.  Preventive health is updated, specifically  Cancer screening and Immunization.   . The PT reports difficulty swallowing blood pressure meds at times, due to gastric bypass, and is requesting a change to liquid meds if possible. 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 Denies sinus pressure, nasal congestion, ear pain or sore throat. Denies chest congestion, productive cough or wheezing. Denies chest pains, palpitations and leg swelling Denies abdominal pain, nausea, vomiting,diarrhea or constipation.   Denies dysuria, frequency, hesitancy or incontinence. Denies joint pain, swelling and limitation in mobility. Denies headaches, seizures, numbness, or tingling. Denies depression, or insomnia. Denies skin break down or rash.        Objective:   Physical Exam Pleasant well nourished female, alert and oriented x 3, in no cardio-pulmonary distress. Afebrile. HEENT No facial trauma or asymetry. Sinuses non tender.  EOMI, PERTL, fundoscopic exam is normal, no hemorhage or exudate.  External ears normal, tympanic membranes clear. Oropharynx moist, no exudate, good dentition. Neck: supple, no adenopathy,JVD or thyromegaly.No bruits.  Chest: Clear to ascultation bilaterally.No crackles or wheezes. Non tender to palpation  Breast: No asymetry,no masses. No nipple discharge or inversion. No axillary or supraclavicular  adenopathy  Cardiovascular system; Heart sounds normal,  S1 and  S2 ,no S3.  No murmur, or thrill. Apical beat not displaced Peripheral pulses normal.  Abdomen: Soft, non tender, no organomegaly or masses. No bruits. Bowel sounds normal. No guarding, tenderness or rebound.   GU: External genitalia normal. No lesions. Vaginal canal normal.No discharge. Uterus normal size, no adnexal masses, no cervical motion or adnexal tenderness.  Musculoskeletal exam: Full ROM of spine, hips , shoulders and knees. No deformity ,swelling or crepitus noted. No muscle wasting or atrophy.   Neurologic: Cranial nerves 2 to 12 intact. Power, tone ,sensation and reflexes normal throughout. No disturbance in gait. No tremor.  Skin: Intact, no ulceration, erythema , scaling or rash noted. Pigmentation normal throughout  Psych; Normal mood and affect. Judgement and concentration normal        Assessment & Plan:

## 2012-10-27 NOTE — Assessment & Plan Note (Signed)
Patient educated about the importance of limiting  Carbohydrate intake , the need to commit to daily physical activity for a minimum of 30 minutes , and to commit weight loss. The fact that changes in all these areas will reduce or eliminate all together the development of diabetes is stressed.    

## 2012-10-27 NOTE — Patient Instructions (Addendum)
F/u in 4 month.  Weight loss goal of 4 pounds per month, you will get a 1500 calorie diet sheet.  Cholesterol is high, please cut back on fried and fatty foods    You are referred for a mammogram due to breast pain, and positive family h/o breast cancer  It is important that you exercise regularly at least 30 minutes 5 times a week. If you develop chest pain, have severe difficulty breathing, or feel very tired, stop exercising immediately and seek medical attention   Fasting lipid, chem 7 and HBa1C in 4 month  You need weekly vit d for at least 6 month, med is sent to your pharmacy  Dose increase on clonidine and changed to a a patch, blood pressure still too high

## 2012-10-27 NOTE — Assessment & Plan Note (Signed)
Annual exam completed and documented. N palpable breast mass but right breast tender, referred for diagnostic mammogram, pos breast cancer in her mother. Importance of lifestyle change to facilitate weight loss discussed and goals set

## 2012-10-27 NOTE — Assessment & Plan Note (Signed)
Pt to start weekly supplement

## 2012-10-27 NOTE — Assessment & Plan Note (Addendum)
Uncontrolled, inc clonidine dose DASH diet and commitment to daily physical activity for a minimum of 30 minutes discussed and encouraged, as a part of hypertension management. The importance of attaining a healthy weight is also discussed.

## 2012-10-27 NOTE — Assessment & Plan Note (Signed)
unchanged Patient re-educated about  the importance of commitment to a  minimum of 150 minutes of exercise per week. The importance of healthy food choices with portion control discussed. Encouraged to start a food diary, count calories and to consider  joining a support group. Sample diet sheets offered. Goals set by the patient for the next several months.    

## 2012-10-29 ENCOUNTER — Other Ambulatory Visit: Payer: Self-pay | Admitting: Family Medicine

## 2012-11-02 ENCOUNTER — Other Ambulatory Visit: Payer: Self-pay

## 2012-11-02 MED ORDER — METRONIDAZOLE 500 MG PO TABS: 500.0000 mg | ORAL_TABLET | Freq: Two times a day (BID) | ORAL | Status: DC

## 2012-11-18 ENCOUNTER — Encounter (HOSPITAL_COMMUNITY): Payer: BC Managed Care – PPO

## 2012-12-02 ENCOUNTER — Ambulatory Visit (HOSPITAL_COMMUNITY)
Admission: RE | Admit: 2012-12-02 | Discharge: 2012-12-02 | Disposition: A | Discharge status: Home / Self Care | Payer: BC Managed Care – PPO | Source: Ambulatory Visit | Attending: Family Medicine | Admitting: Family Medicine

## 2012-12-02 ENCOUNTER — Other Ambulatory Visit (HOSPITAL_COMMUNITY): Payer: Self-pay | Admitting: Family Medicine

## 2012-12-02 DIAGNOSIS — N644 Mastodynia: Secondary | ICD-10-CM

## 2012-12-02 DIAGNOSIS — Z803 Family history of malignant neoplasm of breast: Secondary | ICD-10-CM | POA: Insufficient documentation

## 2013-01-29 ENCOUNTER — Encounter: Payer: Self-pay | Admitting: Family Medicine

## 2013-02-22 ENCOUNTER — Ambulatory Visit: Payer: BC Managed Care – PPO | Admitting: Family Medicine

## 2013-02-27 LAB — BASIC METABOLIC PANEL
BUN: 12 mg/dL (ref 6–23)
Creat: 1.16 mg/dL — ABNORMAL HIGH (ref 0.50–1.10)
Glucose, Bld: 87 mg/dL (ref 70–99)

## 2013-02-27 LAB — LIPID PANEL
Cholesterol: 204 mg/dL — ABNORMAL HIGH (ref 0–200)
Total CHOL/HDL Ratio: 4.9 Ratio
Triglycerides: 61 mg/dL (ref <150)
VLDL: 12 mg/dL (ref 0–40)

## 2013-02-27 LAB — HEMOGLOBIN A1C
Hgb A1c MFr Bld: 5.6 % (ref <5.7)
Mean Plasma Glucose: 114 mg/dL (ref <117)

## 2013-03-01 ENCOUNTER — Other Ambulatory Visit: Payer: Self-pay | Admitting: Family Medicine

## 2013-03-02 ENCOUNTER — Ambulatory Visit (INDEPENDENT_AMBULATORY_CARE_PROVIDER_SITE_OTHER): Payer: BC Managed Care – PPO | Admitting: Family Medicine

## 2013-03-02 ENCOUNTER — Encounter: Payer: Self-pay | Admitting: Family Medicine

## 2013-03-02 ENCOUNTER — Other Ambulatory Visit: Payer: Self-pay

## 2013-03-02 VITALS — BP 142/88 | HR 70 | Resp 18 | Ht 62.0 in | Wt 243.0 lb

## 2013-03-02 DIAGNOSIS — I10 Essential (primary) hypertension: Secondary | ICD-10-CM

## 2013-03-02 DIAGNOSIS — J309 Allergic rhinitis, unspecified: Secondary | ICD-10-CM

## 2013-03-02 MED ORDER — AMLODIPINE BESYLATE 10 MG PO TABS: ORAL_TABLET | ORAL | Status: DC

## 2013-03-02 MED ORDER — TRIAMTERENE-HCTZ 37.5-25 MG PO TABS: 1.0000 | ORAL_TABLET | Freq: Every day | ORAL | Status: DC

## 2013-03-02 NOTE — Patient Instructions (Signed)
Start maxzide 1 tablet daily Continue norvasc for blood pressure Stop the clonidine F/U via phone 1 week F/U Dr.Simpson 8 weeks in office

## 2013-03-03 ENCOUNTER — Encounter: Payer: Self-pay | Admitting: Family Medicine

## 2013-03-03 ENCOUNTER — Ambulatory Visit: Payer: BC Managed Care – PPO | Admitting: Family Medicine

## 2013-03-03 NOTE — Progress Notes (Signed)
  Subjective:    Patient ID: [name], female    DOB: 1976-06-10, 37 y.o.   MRN: [id number]  HPI  Pt here with elevated BP, currently on norvasc and clonidine patch. Patch will not stay on, has tried multiple places, previously on maxide which worked but stopped due to diuretic effect on her work day. She had difficulty taking the Clonidine twice a day due to her work schedule. She has had a mild headache and dizziness this week. No syncope, no SOB, no CP. She has been taking zyrtec OTC and Goody powders as needed.  Review of Systems - per above  GEN- denies fatigue, fever, weight loss,weakness, recent illness HEENT- denies eye drainage, change in vision, nasal discharge, CVS- denies chest pain, palpitations RESP- denies SOB, cough, wheeze Neuro- + headache, +dizziness, syncope, seizure activity      Objective:   Physical Exam GEN- NAD, alert and oriented x3, obese HEENT- PERRL, EOMI, non injected sclera, pink conjunctiva, MMM, oropharynx clear, no papilledema, TM clear bilat, no sinus tenderness,nares clear Neck- Supple, no thyromegaly,  CVS- RRR, no murmur RESP-CTAB EXT- No edema Pulses- Radial, DP- 2+ NEURO- CNII-XII in tact,no focal deficits       Assessment & Plan:

## 2013-03-03 NOTE — Assessment & Plan Note (Addendum)
Uncontrolled HTN, reviewed labs with pt D/c patch as it will not stay on, pt decided to return to maxzide along with norvasc Advised to use plain zyrtec without the decongestant Avoid GOody powders F/u via phone 1 week

## 2013-05-05 ENCOUNTER — Ambulatory Visit (INDEPENDENT_AMBULATORY_CARE_PROVIDER_SITE_OTHER): Payer: BC Managed Care – PPO | Admitting: Family Medicine

## 2013-05-05 ENCOUNTER — Encounter: Payer: Self-pay | Admitting: Family Medicine

## 2013-05-05 VITALS — BP 140/100 | HR 89 | Resp 16 | Ht 62.0 in | Wt 239.0 lb

## 2013-05-05 DIAGNOSIS — E785 Hyperlipidemia, unspecified: Secondary | ICD-10-CM

## 2013-05-05 DIAGNOSIS — I10 Essential (primary) hypertension: Secondary | ICD-10-CM

## 2013-05-05 MED ORDER — TRIAMTERENE-HCTZ 75-50 MG PO TABS
1.0000 | ORAL_TABLET | Freq: Every day | ORAL | Status: DC
Start: 2013-05-05 — End: 2014-05-05

## 2013-05-05 NOTE — Patient Instructions (Addendum)
F/u in 3 month, call if you need me before  Blood pressure still elevated, INCREASE maxzide to 50mg  once daily, take TWO 25 mg tablets every day at the same time and continue amlodipine 10 mg daily  Fasting chem 7 in 3 month  Weight loss goal of 2.5 per month

## 2013-05-05 NOTE — Progress Notes (Signed)
  Subjective:    Patient ID: [name], female    DOB: 26-Aug-1976, 37 y.o.   MRN: [id number]  HPI The PT is here for follow up and re-evaluation of chronic medical conditions, medication management and review of any available recent lab and radiology data.  Preventive health is updated, specifically  Cancer screening and Immunization.   Reports that 2 weeks ago following lidocaine for dental work followed by [facility] powder, she had 3 episodes of loss of conciousness The PT denies any adverse reactions to current medications since the last visit.  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 Denies sinus pressure, nasal congestion, ear pain or sore throat. Denies chest congestion, productive cough or wheezing. Denies chest pains, palpitations and leg swelling Denies abdominal pain, nausea, vomiting,diarrhea or constipation.   Denies dysuria, frequency, hesitancy or incontinence. Denies joint pain, swelling and limitation in mobility. Denies headaches, seizures, numbness, or tingling. Denies depression, anxiety or insomnia. Denies skin break down or rash.        Objective:   Physical Exam  Patient alert and oriented and in no cardiopulmonary distress.  HEENT: No facial asymmetry, EOMI, no sinus tenderness,  oropharynx pink and moist.  Neck supple no adenopathy.  Chest: Clear to auscultation bilaterally.  CVS: S1, S2 no murmurs, no S3.  ABD: Soft non tender. Bowel sounds normal.  Ext: No edema  MS: Adequate ROM spine, shoulders, hips and knees.  Skin: Intact, no ulcerations or rash noted.  Psych: Good eye contact, normal affect. Memory intact not anxious or depressed appearing.  CNS: CN 2-12 intact, power, tone and sensation normal throughout.       Assessment & Plan:

## 2013-05-10 NOTE — Assessment & Plan Note (Signed)
Improved. Pt applauded on succesful weight loss through lifestyle change, and encouraged to continue same. Weight loss goal set for the next several months.  

## 2013-05-10 NOTE — Assessment & Plan Note (Signed)
Hyperlipidemia:Low fat diet discussed and encouraged.  No meds at this time 

## 2013-05-10 NOTE — Assessment & Plan Note (Signed)
Uncontrolled, increase maxzide dose DASH diet and commitment to daily physical activity for a minimum of 30 minutes discussed and encouraged, as a part of hypertension management. The importance of attaining a healthy weight is also discussed.

## 2013-08-04 ENCOUNTER — Ambulatory Visit: Payer: BC Managed Care – PPO | Admitting: Family Medicine

## 2013-09-02 ENCOUNTER — Other Ambulatory Visit: Payer: Self-pay

## 2013-09-14 ENCOUNTER — Telehealth: Payer: Self-pay | Admitting: Family Medicine

## 2013-09-15 NOTE — Telephone Encounter (Signed)
Patient will need appt

## 2013-09-16 NOTE — Telephone Encounter (Signed)
pls advise use otc products, saline flush, claritin, tylenol for pain relief, if persists and worsens over 5 to 7 days, with fever chills , green drainage needs ov

## 2013-09-25 ENCOUNTER — Telehealth: Payer: Self-pay | Admitting: Family Medicine

## 2013-09-25 LAB — BASIC METABOLIC PANEL
CO2: 27 mEq/L (ref 19–32)
Chloride: 101 mEq/L (ref 96–112)
Glucose, Bld: 111 mg/dL — ABNORMAL HIGH (ref 70–99)
Sodium: 137 mEq/L (ref 135–145)

## 2013-09-25 MED ORDER — AZITHROMYCIN 250 MG PO TABS: ORAL_TABLET | ORAL | Status: AC

## 2013-09-25 MED ORDER — PREDNISONE 5 MG PO TABS: 5.0000 mg | ORAL_TABLET | Freq: Two times a day (BID) | ORAL | Status: AC

## 2013-09-25 MED ORDER — BENZONATATE 100 MG PO CAPS: 100.0000 mg | ORAL_CAPSULE | Freq: Three times a day (TID) | ORAL | Status: DC | PRN

## 2013-09-25 NOTE — Telephone Encounter (Signed)
Medication sent to [facility], o pt to get labs t oday, has sheet, and will be worked in 8am on 09/29/2013

## 2013-09-25 NOTE — Telephone Encounter (Signed)
10 day h/o head congestion, this has improved, pt started coughing x 4 days, mucus is thick and yellow, chest feels tight with cough, has used sudafed  And mucinex. No known allergies  Will send in z pack, tessalon perles, pred x 5 days, already ahs cough suppressant

## 2013-09-26 ENCOUNTER — Other Ambulatory Visit: Payer: Self-pay | Admitting: Family Medicine

## 2013-09-26 ENCOUNTER — Encounter: Payer: Self-pay | Admitting: Family Medicine

## 2013-09-26 MED ORDER — POTASSIUM CHLORIDE CRYS ER 20 MEQ PO TBCR: 20.0000 meq | EXTENDED_RELEASE_TABLET | Freq: Every day | ORAL | Status: DC

## 2013-09-29 ENCOUNTER — Ambulatory Visit (INDEPENDENT_AMBULATORY_CARE_PROVIDER_SITE_OTHER): Payer: BC Managed Care – PPO | Admitting: Family Medicine

## 2013-09-29 ENCOUNTER — Encounter: Payer: Self-pay | Admitting: Family Medicine

## 2013-09-29 VITALS — BP 122/80 | HR 70 | Resp 18 | Ht 62.0 in | Wt 229.1 lb

## 2013-09-29 DIAGNOSIS — R5381 Other malaise: Secondary | ICD-10-CM

## 2013-09-29 DIAGNOSIS — N926 Irregular menstruation, unspecified: Secondary | ICD-10-CM

## 2013-09-29 DIAGNOSIS — R7303 Prediabetes: Secondary | ICD-10-CM

## 2013-09-29 DIAGNOSIS — R7309 Other abnormal glucose: Secondary | ICD-10-CM

## 2013-09-29 DIAGNOSIS — L700 Acne vulgaris: Secondary | ICD-10-CM | POA: Insufficient documentation

## 2013-09-29 DIAGNOSIS — I10 Essential (primary) hypertension: Secondary | ICD-10-CM

## 2013-09-29 DIAGNOSIS — Z1239 Encounter for other screening for malignant neoplasm of breast: Secondary | ICD-10-CM

## 2013-09-29 DIAGNOSIS — L708 Other acne: Secondary | ICD-10-CM

## 2013-09-29 DIAGNOSIS — E785 Hyperlipidemia, unspecified: Secondary | ICD-10-CM

## 2013-09-29 DIAGNOSIS — E8881 Metabolic syndrome: Secondary | ICD-10-CM

## 2013-09-29 DIAGNOSIS — E559 Vitamin D deficiency, unspecified: Secondary | ICD-10-CM

## 2013-09-29 DIAGNOSIS — N920 Excessive and frequent menstruation with regular cycle: Secondary | ICD-10-CM | POA: Insufficient documentation

## 2013-09-29 MED ORDER — DOXYCYCLINE HYCLATE 100 MG PO TABS: 100.0000 mg | ORAL_TABLET | Freq: Every day | ORAL | Status: AC

## 2013-09-29 NOTE — Patient Instructions (Addendum)
cPE and pap in mid March, call if you need me before  Flu vaccine on 12/12 with nurse at 8 :15am  You are referred for mammogram, pelvic US on 10/27/2013, call back for appt info   Labs today HBa1C, FSH, LH, testosterone level, cbc, tsh and vit D    New medication for acne is doxycycline 1 daily  Keep up healthy weight loss effort and commit to 30 minutes daily of exercise pls

## 2013-09-29 NOTE — Progress Notes (Signed)
   Subjective:    Patient ID: [name], female    DOB: 05/02/1976, 37 y.o.   MRN: [id number]  HPI Pt treated since past 4 days for sinusitis and acute bronchitis, marked symptom improvement as far as wheeze and chest congestion are concerned, still does have some yellow sputum 8 month h/o irregular menses, every 2 to 8 weeks, development of facial acne and facial hair, concerned as this  Is all new. Arora is pleasantly surprised at significant weight loss despite no regular exercise, her only new situation is markedly improved work environment , hence less stress. Denies polyuria, polydipsia , blurred vision or excessive thirst    Review of Systems See HPI  Denies chest congestion, productive cough or wheezing. Denies chest pains, palpitations and leg swelling Denies abdominal pain, nausea, vomiting,diarrhea or constipation.   Denies dysuria, frequency, hesitancy or incontinence. Denies joint pain, swelling and limitation in mobility. Denies headaches, seizures, numbness, or tingling. Denies depression, anxiety or insomnia.       Objective:   Physical Exam  Patient alert and oriented and in no cardiopulmonary distress.  HEENT: No facial asymmetry, EOMI, mild maxillary siinus tenderness,  oropharynx pink and moist.  Neck supple no adenopathy.  Chest: adequate air entry, scattered crackles no wheezes.  CVS: S1, S2 no murmurs, no S3.  ABD: Soft non tender. Bowel sounds normal.  Ext: No edema  MS: Adequate ROM spine, shoulders, hips and knees.  Skin: Intact, facial acne present which is new  Psych: Good eye contact, normal affect. Memory intact not anxious or depressed appearing.  CNS: CN 2-12 intact, power, tone and sensation normal throughout.       Assessment & Plan:

## 2013-09-30 LAB — CBC
MCH: 28.7 pg (ref 26.0–34.0)
MCHC: 34.3 g/dL (ref 30.0–36.0)
MCV: 83.7 fL (ref 78.0–100.0)
Platelets: 263 10*3/uL (ref 150–400)
RDW: 13.5 % (ref 11.5–15.5)
WBC: 12 10*3/uL — ABNORMAL HIGH (ref 4.0–10.5)

## 2013-09-30 LAB — HEMOGLOBIN A1C: Hgb A1c MFr Bld: 6.2 % — ABNORMAL HIGH (ref <5.7)

## 2013-10-06 ENCOUNTER — Encounter: Payer: Self-pay | Admitting: Family Medicine

## 2013-10-08 ENCOUNTER — Ambulatory Visit (INDEPENDENT_AMBULATORY_CARE_PROVIDER_SITE_OTHER): Payer: BC Managed Care – PPO

## 2013-10-08 DIAGNOSIS — Z23 Encounter for immunization: Secondary | ICD-10-CM

## 2013-10-13 ENCOUNTER — Encounter: Payer: Self-pay | Admitting: Family Medicine

## 2013-10-14 ENCOUNTER — Other Ambulatory Visit: Payer: Self-pay | Admitting: Family Medicine

## 2013-10-14 MED ORDER — POTASSIUM CHLORIDE ER 10 MEQ PO TBCR: 10.0000 meq | EXTENDED_RELEASE_TABLET | Freq: Two times a day (BID) | ORAL | Status: DC

## 2013-10-16 DIAGNOSIS — R7303 Prediabetes: Secondary | ICD-10-CM | POA: Insufficient documentation

## 2013-10-16 NOTE — Assessment & Plan Note (Signed)
Improved. Pt applauded on succesful weight loss through lifestyle change, and encouraged to continue same. Weight loss goal set for the next several months.  

## 2013-10-16 NOTE — Assessment & Plan Note (Signed)
Pt strongly encouraged to take metformin she however opts  to hold off at this time and work on lifestyle change

## 2013-10-16 NOTE — Assessment & Plan Note (Signed)
Needs to take weekly supplement to improve levels

## 2013-10-16 NOTE — Assessment & Plan Note (Signed)
New late onset acne with menstrual irregularity. Start doxycycline , pelvic US to evaluate for PCO and also testosterone level

## 2013-10-16 NOTE — Assessment & Plan Note (Signed)
Pt awrae of her increased CV risk and the need to work on lifestyle changes to improve health

## 2013-10-16 NOTE — Assessment & Plan Note (Signed)
Controlled, no change in medication DASH diet and commitment to daily physical activity for a minimum of 30 minutes discussed and encouraged, as a part of hypertension management. The importance of attaining a healthy weight is also discussed.  

## 2013-10-16 NOTE — Assessment & Plan Note (Signed)
Elevated lipid levels. Hyperlipidemia:Low fat diet discussed and encouraged.  Pt opts not to take statin but to focus on diet. 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 There is also a f/h of CAd in her Mom

## 2013-10-25 ENCOUNTER — Encounter: Payer: Self-pay | Admitting: Family Medicine

## 2013-10-25 ENCOUNTER — Ambulatory Visit (HOSPITAL_COMMUNITY): Payer: BC Managed Care – PPO

## 2013-10-25 ENCOUNTER — Ambulatory Visit (HOSPITAL_COMMUNITY)
Admission: RE | Admit: 2013-10-25 | Discharge: 2013-10-25 | Disposition: A | Discharge status: Home / Self Care | Payer: BC Managed Care – PPO | Source: Ambulatory Visit | Attending: Family Medicine | Admitting: Family Medicine

## 2013-10-25 ENCOUNTER — Other Ambulatory Visit (HOSPITAL_COMMUNITY): Payer: BC Managed Care – PPO

## 2013-10-25 DIAGNOSIS — N83209 Unspecified ovarian cyst, unspecified side: Secondary | ICD-10-CM | POA: Insufficient documentation

## 2013-10-25 DIAGNOSIS — N926 Irregular menstruation, unspecified: Secondary | ICD-10-CM

## 2013-12-06 ENCOUNTER — Ambulatory Visit (HOSPITAL_COMMUNITY)
Admission: RE | Admit: 2013-12-06 | Discharge: 2013-12-06 | Disposition: A | Discharge status: Home / Self Care | Payer: BC Managed Care – PPO | Source: Ambulatory Visit | Attending: Family Medicine | Admitting: Family Medicine

## 2013-12-06 DIAGNOSIS — Z1239 Encounter for other screening for malignant neoplasm of breast: Secondary | ICD-10-CM

## 2013-12-06 DIAGNOSIS — Z1231 Encounter for screening mammogram for malignant neoplasm of breast: Secondary | ICD-10-CM | POA: Insufficient documentation

## 2013-12-10 ENCOUNTER — Other Ambulatory Visit: Payer: Self-pay | Admitting: Family Medicine

## 2013-12-13 ENCOUNTER — Other Ambulatory Visit: Payer: Self-pay | Admitting: Family Medicine

## 2014-01-06 ENCOUNTER — Other Ambulatory Visit (HOSPITAL_COMMUNITY)
Admission: RE | Admit: 2014-01-06 | Discharge: 2014-01-06 | Disposition: A | Discharge status: Home / Self Care | Payer: BC Managed Care – PPO | Source: Ambulatory Visit | Attending: Family Medicine | Admitting: Family Medicine

## 2014-01-06 ENCOUNTER — Encounter: Payer: Self-pay | Admitting: Family Medicine

## 2014-01-06 ENCOUNTER — Ambulatory Visit (INDEPENDENT_AMBULATORY_CARE_PROVIDER_SITE_OTHER): Payer: BC Managed Care – PPO | Admitting: Family Medicine

## 2014-01-06 VITALS — BP 122/80 | HR 76 | Resp 18 | Ht 62.0 in | Wt 226.1 lb

## 2014-01-06 DIAGNOSIS — R7303 Prediabetes: Secondary | ICD-10-CM

## 2014-01-06 DIAGNOSIS — Z0000 Encounter for general adult medical examination without abnormal findings: Secondary | ICD-10-CM | POA: Diagnosis not present

## 2014-01-06 DIAGNOSIS — I10 Essential (primary) hypertension: Secondary | ICD-10-CM

## 2014-01-06 DIAGNOSIS — E785 Hyperlipidemia, unspecified: Secondary | ICD-10-CM

## 2014-01-06 DIAGNOSIS — Z01419 Encounter for gynecological examination (general) (routine) without abnormal findings: Secondary | ICD-10-CM | POA: Insufficient documentation

## 2014-01-06 DIAGNOSIS — N760 Acute vaginitis: Secondary | ICD-10-CM | POA: Insufficient documentation

## 2014-01-06 DIAGNOSIS — E8881 Metabolic syndrome: Secondary | ICD-10-CM

## 2014-01-06 DIAGNOSIS — Z113 Encounter for screening for infections with a predominantly sexual mode of transmission: Secondary | ICD-10-CM | POA: Insufficient documentation

## 2014-01-06 LAB — CERVICOVAGINAL ANCILLARY ONLY
WET PREP (BD AFFIRM): NEGATIVE
Wet Prep (BD Affirm): NEGATIVE
Wet Prep (BD Affirm): POSITIVE — AB

## 2014-01-06 NOTE — Patient Instructions (Addendum)
F/u in 4 month, call if you need me before   Fasting lipid, cmp, hBA1c this week. I will send in terbinafine for 3 month, for fungal toenail infection if liver test is normal, as we discussed  HBA1C, fasting lipid and cmp in 4 month, 3 to 5 days before next visit.  Congrats on weight loss , keep it up!  It is important that you exercise regularly at least 30 minutes 5 times a week. If you develop chest pain, have severe difficulty breathing, or feel very tired, stop exercising immediately and seek medical attention   Metabolic Syndrome, Adult Metabolic syndrome descibes a group of risk factors for heart disease and diabetes. This syndrome has other names including Insulin Resistance Syndrome. The more risk factors you have, the higher your risk of having a heart attack, stroke, or developing diabetes. These risk factors include:  High blood sugar.  High blood triglyceride (a fat found in the blood) level.  High blood pressure.  Abdominal obesity (your extra weight is around your waist instead of your hips).  Low levels of high-density lipoprotein, HDL (good blood cholesterol). If you have any three of these risk factors, you have metabolic syndrome. If you have even one of these factors, you should make lifestyle changes to improve your health in order to prevent serious health diseases.  In people with metabolic syndrome, the cells do not respond properly to insulin. This can lead to high levels of glucose in the blood, which can interfere with normal body processes. Eventually, this can cause high blood pressure and higher fat levels in the blood, and inflammation of your blood vessels. The result can be heart disease and stroke.  CAUSES   Eating a diet rich in calories and saturated fat.  Too little physical activity.  Being overweight. Other underlying causes are:  Family history (genetics).  Ethnicity (South Asians are at a higher risk).  Older age (your chances of  developing metabolic syndrome are higher as you grow older).  Insulin resistance. SYMPTOMS  By itself, metabolic syndrome has no symptoms. However, you might have symptoms of diabetes (high blood sugar) or high blood pressure, such as:  Increased thirst, urination, and tiredness.  Dizzy spells.  Dull headaches that are unusual for you.  Blurred vision.  Nosebleeds. DIAGNOSIS  Your caregiver may make a diagnosis of metabolic syndrome if you have at least three of these factors:  If you are overweight mostly around the waist. This means a waistline greater than 40" in men and more than 35" in women. The waistline limits are 31 to 35 inches for women and 37 to 39 inches for men. In those who have certain genetic risk factors, such as having a family history of diabetes or being of Asian descent.  If you have a blood pressure of 130/85 mm Hg or more, or if you are being treated for high blood pressure.  If your blood triglyceride level is 150 mg/dL or more, or you are being treated for high levels of triglyceride.  If the level of HDL in your blood is below 40 mg/dL in men, less than 50 mg/dL in women, or you are receiving treatment for low levels of HDL.  If the level of sugar in your blood is high with fasting blood sugar level of 110 mg/dL or more, or you are under treatment for diabetes. TREATMENT  Your caregiver may have you make lifestyle changes, which may include:  Exercise.  Losing weight.  Maintaining a healthy  diet.  Quitting smoking. The lifestyle changes listed above are key in reducing your risk for heart disease and stroke. Medicines may also be prescribed to help your body respond to insulin better and to reduce your blood pressure and blood fat levels. Aspirin may be recommended to reduce risks of heart disease or stroke.  HOME CARE INSTRUCTIONS   Exercise.  Measure your waist at regular intervals just above the hipbones after you have breathed out.  Maintain  a healthy diet.  Eat fruits, such as apples, oranges, and pears.  Eat vegetables.  Eat legumes, such as kidney beans, peas, and lentils.  Eat food rich in soluble fiber, such as whole grain cereal, oatmeal, and oat bran.  Use olive or safflower oils and avoid saturated fats.  Eat nuts.  Limit the amount of salt you eat or add to food.  Limit the amount of alcohol you drink.  Include fish in your diet, if possible.  Stop smoking if you are a smoker.  Maintain regular follow-up appointments.  Follow your caregiver's advice. SEEK MEDICAL CARE IF:   You feel very tired or fatigued.  You develop excessive thirst.  You pass large quantities of urine.  You are putting on weight around your waist rather than losing weight.  You develop headaches over and over again.  You have off-and-on dizzy spells. SEEK IMMEDIATE MEDICAL CARE IF:   You develop nosebleeds.  You develop sudden blurred vision.  You develop sudden dizzy spells.  You develop chest pains, trouble breathing, or feel an abnormal or irregular heart beat.  You have a fainting episode.  You develop any sudden trouble speaking and/or swallowing.  You develop sudden weakness in one arm and/or one leg. MAKE SURE YOU:   Understand these instructions.  Will watch your condition.  Will get help right away if you are not doing well or get worse. Document Released: 01/21/2008 Document Revised: 01/06/2012 Document Reviewed: 01/21/2008 [facility] Patient Information 2014 [place], [place].

## 2014-01-08 ENCOUNTER — Encounter: Payer: Self-pay | Admitting: Family Medicine

## 2014-01-08 LAB — HEMOGLOBIN A1C
HEMOGLOBIN A1C: 5.6 % (ref <5.7)
Mean Plasma Glucose: 114 mg/dL (ref <117)

## 2014-01-08 LAB — LIPID PANEL
Cholesterol: 210 mg/dL — ABNORMAL HIGH (ref 0–200)
HDL: 44 mg/dL (ref >39)
LDL Cholesterol: 149 mg/dL — ABNORMAL HIGH (ref 0–99)
Total CHOL/HDL Ratio: 4.8 Ratio
Triglycerides: 87 mg/dL (ref <150)
VLDL: 17 mg/dL (ref 0–40)

## 2014-01-08 LAB — COMPREHENSIVE METABOLIC PANEL
ALT: 12 U/L (ref 0–35)
AST: 12 U/L (ref 0–37)
Albumin: 3.6 g/dL (ref 3.5–5.2)
Alkaline Phosphatase: 68 U/L (ref 39–117)
BILIRUBIN TOTAL: 0.5 mg/dL (ref 0.2–1.2)
BUN: 15 mg/dL (ref 6–23)
CHLORIDE: 101 meq/L (ref 96–112)
CO2: 29 mEq/L (ref 19–32)
Calcium: 9.2 mg/dL (ref 8.4–10.5)
Creat: 1.1 mg/dL (ref 0.50–1.10)
Glucose, Bld: 87 mg/dL (ref 70–99)
Potassium: 3.2 mEq/L — ABNORMAL LOW (ref 3.5–5.3)
SODIUM: 139 meq/L (ref 135–145)
Total Protein: 6.6 g/dL (ref 6.0–8.3)

## 2014-01-09 ENCOUNTER — Encounter: Payer: Self-pay | Admitting: Family Medicine

## 2014-01-09 NOTE — Progress Notes (Signed)
   Subjective:    Patient ID: [name], female    DOB: 28-Jul-1976, 38 y.o.   MRN: [id number]  HPI Patient is in for annual exam Health maintainance is reviewed and updated, specifically screening tests and recommended immunizations. Recent lab and radiologic data, since previous visit is also reviewed with the patient. Healthy lifestyle as far as commitment to regular physical activity, heart healthy diet , safe habits, as far as seat belt and helmet use,safe and responsible sex involving condom and contraceptive use , also safe storage of firearms is discussed. The importance of adequate rest is also discussed.       Review of Systems    See HPI Denies recent fever or chills. Denies sinus pressure, nasal congestion, ear pain or sore throat. Denies chest congestion, productive cough or wheezing. Denies chest pains, palpitations and leg swelling Denies abdominal pain, nausea, vomiting,diarrhea or constipation.   Denies dysuria, frequency, hesitancy or incontinence. Denies joint pain, swelling and limitation in mobility. Denies headaches, seizures, numbness, or tingling. Denies depression, anxiety or insomnia. Denies skin break down or rash.     Objective:   Physical Exam  Pleasant well nourished female, alert and oriented x 3, in no cardio-pulmonary distress. Afebrile. HEENT No facial trauma or asymetry. Sinuses non tender.  EOMI, PERTL, fundoscopic exam is normal, no hemorhage or exudate.  External ears normal, tympanic membranes clear. Oropharynx moist, no exudate, good dentition. Neck: supple, no adenopathy,JVD or thyromegaly.No bruits.  Chest: Clear to ascultation bilaterally.No crackles or wheezes. Non tender to palpation  Breast: No asymetry,no masses. No nipple discharge or inversion. No axillary or supraclavicular adenopathy  Cardiovascular system; Heart sounds normal,  S1 and  S2 ,no S3.  No murmur, or thrill. Apical beat not  displaced Peripheral pulses normal.  Abdomen: Soft, non tender, no organomegaly or masses. No bruits. Bowel sounds normal. No guarding, tenderness or rebound.   GU: External genitalia normal. No lesions. Vaginal canal normal.No discharge. Uterus normal size, no adnexal masses, no cervical motion or adnexal tenderness.  Musculoskeletal exam: Full ROM of spine, hips , shoulders and knees. No deformity ,swelling or crepitus noted. No muscle wasting or atrophy.   Neurologic: Cranial nerves 2 to 12 intact. Power, tone ,sensation and reflexes normal throughout. No disturbance in gait. No tremor.  Skin: Intact, no ulceration, erythema , scaling or rash noted. Pigmentation normal throughout  Psych; Normal mood and affect. Judgement and concentration normal       Assessment & Plan:  Routine general medical examination at a health care facility Annual exam as documented. Counseling done  re healthy lifestyle involving commitment to 150 minutes exercise per week, heart healthy diet, and attaining healthy weight.The importance of adequate sleep also discussed. Regular seat belt use and safe storage  of firearms if patient has them, is also discussed. Changes in health habits are decided on by the patient with goals and time frames  set for achieving them. Immunization and cancer screening needs are specifically addressed at this visit.

## 2014-01-09 NOTE — Assessment & Plan Note (Signed)
Annual exam as documented. Counseling done  re healthy lifestyle involving commitment to 150 minutes exercise per week, heart healthy diet, and attaining healthy weight.The importance of adequate sleep also discussed. Regular seat belt use and safe storage  of firearms if patient has them, is also discussed. Changes in health habits are decided on by the patient with goals and time frames  set for achieving them. Immunization and cancer screening needs are specifically addressed at this visit.  

## 2014-01-11 ENCOUNTER — Other Ambulatory Visit: Payer: Self-pay

## 2014-01-11 MED ORDER — METRONIDAZOLE 500 MG PO TABS: 500.0000 mg | ORAL_TABLET | Freq: Two times a day (BID) | ORAL | Status: DC

## 2014-03-18 ENCOUNTER — Other Ambulatory Visit: Payer: Self-pay | Admitting: Family Medicine

## 2014-03-27 ENCOUNTER — Encounter: Payer: Self-pay | Admitting: Family Medicine

## 2014-03-28 ENCOUNTER — Other Ambulatory Visit: Payer: Self-pay | Admitting: Family Medicine

## 2014-03-28 MED ORDER — TERBINAFINE HCL 250 MG PO TABS: 250.0000 mg | ORAL_TABLET | Freq: Every day | ORAL | Status: DC

## 2014-04-27 ENCOUNTER — Encounter: Payer: Self-pay | Admitting: Family Medicine

## 2014-05-02 ENCOUNTER — Telehealth: Payer: Self-pay | Admitting: Family Medicine

## 2014-05-02 MED ORDER — FLUCONAZOLE 150 MG PO TABS: ORAL_TABLET | ORAL | Status: DC

## 2014-05-02 NOTE — Telephone Encounter (Signed)
Pt called on 7/5 c/ovaginal d/c x 3 days unresponsive to OTC antifungal preps, will erx med, she has also assked for OV will send msg

## 2014-05-04 ENCOUNTER — Encounter: Payer: Self-pay | Admitting: Family Medicine

## 2014-05-04 ENCOUNTER — Other Ambulatory Visit (HOSPITAL_COMMUNITY)
Admission: RE | Admit: 2014-05-04 | Discharge: 2014-05-04 | Disposition: A | Discharge status: Home / Self Care | Payer: BC Managed Care – PPO | Source: Ambulatory Visit | Attending: Family Medicine | Admitting: Family Medicine

## 2014-05-04 ENCOUNTER — Ambulatory Visit (INDEPENDENT_AMBULATORY_CARE_PROVIDER_SITE_OTHER): Payer: BC Managed Care – PPO | Admitting: Family Medicine

## 2014-05-04 VITALS — BP 122/80 | HR 72 | Resp 18 | Wt 222.0 lb

## 2014-05-04 DIAGNOSIS — N760 Acute vaginitis: Secondary | ICD-10-CM

## 2014-05-04 DIAGNOSIS — Z113 Encounter for screening for infections with a predominantly sexual mode of transmission: Secondary | ICD-10-CM | POA: Insufficient documentation

## 2014-05-04 DIAGNOSIS — F3289 Other specified depressive episodes: Secondary | ICD-10-CM

## 2014-05-04 DIAGNOSIS — I10 Essential (primary) hypertension: Secondary | ICD-10-CM

## 2014-05-04 DIAGNOSIS — F32A Depression, unspecified: Secondary | ICD-10-CM | POA: Insufficient documentation

## 2014-05-04 DIAGNOSIS — F329 Major depressive disorder, single episode, unspecified: Secondary | ICD-10-CM | POA: Insufficient documentation

## 2014-05-04 NOTE — Patient Instructions (Signed)
F/u in 4 month, call if you need me beforre  Referral as discussed  Specimens  To be sent in as discussed  Commit to daily exercise ,a dn use benadryl for allergies and to help with sleep

## 2014-05-05 DIAGNOSIS — Z113 Encounter for screening for infections with a predominantly sexual mode of transmission: Secondary | ICD-10-CM | POA: Insufficient documentation

## 2014-05-05 DIAGNOSIS — N760 Acute vaginitis: Secondary | ICD-10-CM | POA: Insufficient documentation

## 2014-05-05 LAB — RPR

## 2014-05-05 LAB — HSV 2 ANTIBODY, IGG

## 2014-05-05 LAB — HIV ANTIBODY (ROUTINE TESTING W REFLEX): HIV 1&2 Ab, 4th Generation: NONREACTIVE

## 2014-05-05 NOTE — Progress Notes (Signed)
   Subjective:    Patient ID: [name], female    DOB: Mar 17, 1976, 38 y.o.   MRN: [id number]  HPI 5 day h/o vaginal discharge and itch despite recent treatment with fluconazole. States she [place] past [place] water park,Tearful and depressed, angry, recently found out that her husband had been having an affair. Wants counseling both for herself, now very angry with him , no plan to hurt either herself or anyone else, needs help to deal with the situation, needs counseling. Also interested in marital counseling, spouse does not want to go however, she is trying to talk with him about the situation , but finds it difficult to get anything from him, he is sorry and is trying to make amends. She has been very busy, 2 jobs and school, and feels this is a factor Generally has been feeling better about herself in recent times due to better health habits with weight loss success No interest in medication for depression currently Uncertain if able to enrol in class in august due to her current stressful situation   Review of Systems .See HPI Denies recent fever or chills. Denies sinus pressure, nasal congestion, ear pain or sore throat. Denies chest congestion, productive cough or wheezing. Denies chest pains, palpitations and leg swelling Denies abdominal pain, nausea, vomiting,diarrhea or constipation.   Denies dysuria, frequency, hesitancy or incontinence. Denies joint pain, swelling and limitation in mobility. Denies headaches, seizures, numbness, or tingling. Denies skin break down or rash.        Objective:   Physical Exam BP 122/80  Pulse 72  Resp 18  Wt 222 lb 0.6 oz (100.717 kg)  SpO2 100%  LMP 05/04/2014 Patient alert and oriented and in no cardiopulmonary distress.Pt very tearful and distressed at times during hte inteview  HEENT: No facial asymmetry, EOMI,   oropharynx pink and moist.  Neck supple no JVD, no mass.  Chest: Clear to auscultation  bilaterally.  CVS: S1, S2 no murmurs, no S3.Regular rate.  ABD: Soft non tender.  Pelvic: deferred, urine specimens collected , labs ordered fo RPR and HSV2 and HIV Ext: No edema  MS: Adequate ROM spine, shoulders, hips and knees.  Skin: Intact, no ulcerations or rash noted.  CNS: CN 2-12 intact, power,  normal throughout.no focal deficits noted.        Assessment & Plan:  Depression Situational depression due to marital discord recent onset. Pt needs and desires individual and marital counseling, spouse holding out currently as far as marriage counseling, she will go for individual counseling which will be very beneficial Already displaying fairly good coping skills, just completed teaching vacation bible school for 3 weeks She is to re invest energy in exercise and attention to her personal physical health also. No medication at this time   Vaginitis and vulvovaginitis Persistent vag d/c new onset, recent   Unspecified essential hypertension Controlled, no change in medication DASH diet and commitment to daily physical activity for a minimum of 30 minutes discussed and encouraged, as a part of hypertension management. The importance of attaining a healthy weight is also discussed.   MORBID OBESITY Improved. Pt applauded on succesful weight loss through lifestyle change, and encouraged to continue same. Weight loss goal set for the next several months.   Screen for STD (sexually transmitted disease) New vaginitis symptoms with recent infidelity in spouse , will screen for STD exposure

## 2014-05-05 NOTE — Assessment & Plan Note (Signed)
Persistent vag d/c new onset, recent

## 2014-05-05 NOTE — Assessment & Plan Note (Signed)
New vaginitis symptoms with recent infidelity in spouse , will screen for STD exposure

## 2014-05-05 NOTE — Assessment & Plan Note (Signed)
Controlled, no change in medication DASH diet and commitment to daily physical activity for a minimum of 30 minutes discussed and encouraged, as a part of hypertension management. The importance of attaining a healthy weight is also discussed.  

## 2014-05-05 NOTE — Assessment & Plan Note (Signed)
Improved. Pt applauded on succesful weight loss through lifestyle change, and encouraged to continue same. Weight loss goal set for the next several months.  

## 2014-05-05 NOTE — Assessment & Plan Note (Signed)
Situational depression due to marital discord recent onset. Pt needs and desires individual and marital counseling, spouse holding out currently as far as marriage counseling, she will go for individual counseling which will be very beneficial Already displaying fairly good coping skills, just completed teaching vacation bible school for 3 weeks She is to re invest energy in exercise and attention to her personal physical health also. No medication at this time

## 2014-05-07 ENCOUNTER — Encounter: Payer: Self-pay | Admitting: Family Medicine

## 2014-05-09 ENCOUNTER — Ambulatory Visit: Payer: BC Managed Care – PPO | Admitting: Family Medicine

## 2014-05-25 ENCOUNTER — Ambulatory Visit (HOSPITAL_COMMUNITY): Payer: Self-pay | Admitting: Psychiatry

## 2014-05-25 ENCOUNTER — Other Ambulatory Visit: Payer: Self-pay | Admitting: Family Medicine

## 2014-06-15 ENCOUNTER — Other Ambulatory Visit: Payer: Self-pay | Admitting: Family Medicine

## 2014-07-26 ENCOUNTER — Telehealth: Payer: Self-pay | Admitting: Family Medicine

## 2014-07-26 DIAGNOSIS — R7303 Prediabetes: Secondary | ICD-10-CM

## 2014-07-26 DIAGNOSIS — I10 Essential (primary) hypertension: Secondary | ICD-10-CM

## 2014-07-26 DIAGNOSIS — E785 Hyperlipidemia, unspecified: Secondary | ICD-10-CM

## 2014-07-26 NOTE — Telephone Encounter (Signed)
Pls contact pt, she needs fasting lipid, cmp and EGfr and HBa1C no later than next week Saturday pls, let her know ins has recommended the liver test in particular due to being on meds for fungal nfection, pls order the tests  Pls let her know that I  Hope mentally she has improved, I have not heard from her since last visit , she was referred to [facility] for therapy , no record that she was ever seen...just checking to make sure she is  oK , if not and wants a to see therapist pls follow through , let referral staff know to check on this  ??? pls ask

## 2014-07-27 NOTE — Telephone Encounter (Signed)
Called and left message for patient to return call.   Lab order mailed to home address with directions on when to have drawn.

## 2014-07-27 NOTE — Addendum Note (Signed)
Addended by: Denman George B on: 07/27/2014 10:14 AM   Modules accepted: Orders

## 2014-07-28 ENCOUNTER — Other Ambulatory Visit: Payer: Self-pay | Admitting: Family Medicine

## 2014-08-01 ENCOUNTER — Other Ambulatory Visit: Payer: Self-pay

## 2014-08-01 MED ORDER — TERBINAFINE HCL 250 MG PO TABS: 250.0000 mg | ORAL_TABLET | Freq: Every day | ORAL | Status: DC

## 2014-08-23 ENCOUNTER — Encounter (HOSPITAL_COMMUNITY): Payer: Self-pay | Admitting: Emergency Medicine

## 2014-08-23 ENCOUNTER — Emergency Department (HOSPITAL_COMMUNITY)
Admission: EM | Admit: 2014-08-23 | Discharge: 2014-08-24 | Disposition: A | Discharge status: Home / Self Care | Payer: BC Managed Care – PPO | Attending: Emergency Medicine | Admitting: Emergency Medicine

## 2014-08-23 DIAGNOSIS — R4020 Unspecified coma: Secondary | ICD-10-CM | POA: Diagnosis present

## 2014-08-23 DIAGNOSIS — R55 Syncope and collapse: Secondary | ICD-10-CM

## 2014-08-23 DIAGNOSIS — Z79899 Other long term (current) drug therapy: Secondary | ICD-10-CM | POA: Insufficient documentation

## 2014-08-23 DIAGNOSIS — I10 Essential (primary) hypertension: Secondary | ICD-10-CM | POA: Diagnosis not present

## 2014-08-23 DIAGNOSIS — E669 Obesity, unspecified: Secondary | ICD-10-CM | POA: Diagnosis not present

## 2014-08-23 DIAGNOSIS — Z8739 Personal history of other diseases of the musculoskeletal system and connective tissue: Secondary | ICD-10-CM | POA: Diagnosis not present

## 2014-08-23 DIAGNOSIS — Z792 Long term (current) use of antibiotics: Secondary | ICD-10-CM | POA: Diagnosis not present

## 2014-08-23 DIAGNOSIS — Z9889 Other specified postprocedural states: Secondary | ICD-10-CM | POA: Insufficient documentation

## 2014-08-23 LAB — COMPREHENSIVE METABOLIC PANEL
ALT: 14 U/L (ref 0–35)
AST: 15 U/L (ref 0–37)
Albumin: 3.8 g/dL (ref 3.5–5.2)
Alkaline Phosphatase: 93 U/L (ref 39–117)
Anion gap: 12 (ref 5–15)
BUN: 13 mg/dL (ref 6–23)
CO2: 29 mEq/L (ref 19–32)
Calcium: 9.6 mg/dL (ref 8.4–10.5)
Chloride: 99 mEq/L (ref 96–112)
Creatinine, Ser: 1.33 mg/dL — ABNORMAL HIGH (ref 0.50–1.10)
GFR calc Af Amer: 58 mL/min — ABNORMAL LOW (ref >90)
GFR calc non Af Amer: 50 mL/min — ABNORMAL LOW (ref >90)
Glucose, Bld: 110 mg/dL — ABNORMAL HIGH (ref 70–99)
Potassium: 3.2 mEq/L — ABNORMAL LOW (ref 3.7–5.3)
Sodium: 140 mEq/L (ref 137–147)
Total Bilirubin: 0.2 mg/dL — ABNORMAL LOW (ref 0.3–1.2)
Total Protein: 8.3 g/dL (ref 6.0–8.3)

## 2014-08-23 LAB — I-STAT TROPONIN, ED: Troponin i, poc: 0.01 ng/mL (ref 0.00–0.08)

## 2014-08-23 LAB — CBC WITH DIFFERENTIAL/PLATELET
Basophils Absolute: 0 10*3/uL (ref 0.0–0.1)
Basophils Relative: 0 % (ref 0–1)
Eosinophils Absolute: 0.1 10*3/uL (ref 0.0–0.7)
Eosinophils Relative: 1 % (ref 0–5)
HCT: 42.7 % (ref 36.0–46.0)
Hemoglobin: 14.2 g/dL (ref 12.0–15.0)
Lymphocytes Relative: 23 % (ref 12–46)
Lymphs Abs: 2.3 10*3/uL (ref 0.7–4.0)
MCH: 28.7 pg (ref 26.0–34.0)
MCHC: 33.3 g/dL (ref 30.0–36.0)
MCV: 86.3 fL (ref 78.0–100.0)
Monocytes Absolute: 0.7 10*3/uL (ref 0.1–1.0)
Monocytes Relative: 7 % (ref 3–12)
Neutro Abs: 7 10*3/uL (ref 1.7–7.7)
Neutrophils Relative %: 69 % (ref 43–77)
Platelets: 247 10*3/uL (ref 150–400)
RBC: 4.95 MIL/uL (ref 3.87–5.11)
RDW: 12.6 % (ref 11.5–15.5)
WBC: 10.1 10*3/uL (ref 4.0–10.5)

## 2014-08-23 LAB — I-STAT BETA HCG BLOOD, ED (MC, WL, AP ONLY): I-stat hCG, quantitative: <5 m[IU]/mL (ref <5)

## 2014-08-23 MED ORDER — POTASSIUM CHLORIDE 10 MEQ/100ML IV SOLN
10.0000 meq | Freq: Once | INTRAVENOUS | Status: AC
  Administered 2014-08-23: 10 meq via INTRAVENOUS
  Filled 2014-08-23: qty 100

## 2014-08-23 NOTE — ED Notes (Signed)
Pt states she is unable to take potassium by mouth, secondary to stomach upset s/p gastric bypass surgery

## 2014-08-23 NOTE — ED Notes (Signed)
Pt had dental work yesterday, had syncopal episode while at the drug store tonight, also bradycardia tonight.

## 2014-08-23 NOTE — ED Provider Notes (Signed)
CSN: [id number]     Arrival date & time 08/23/14  1932 History   First MD Initiated Contact with Patient 08/23/14 2249     Chief Complaint  Patient presents with  . Loss of Consciousness     (Consider location/radiation/quality/duration/timing/severity/associated sxs/prior Treatment) Patient is a 38 y.o. female presenting with syncope. The history is provided by the patient. No language interpreter was used.  Loss of Consciousness Episode history:  Single Most recent episode:  Today Progression:  Resolved Chronicity:  New Context comment:  Pain medication Witnessed: yes   Relieved by:  Nothing Worsened by:  Nothing tried Ineffective treatments:  None tried Associated symptoms: no chest pain, no focal weakness, no headaches and no vomiting   Risk factors: no seizures   Pt had dental work yesterday.  Pt reports she took pain medication today.  Pt went to drug store and felt light headed and passed out.  Pt reports the same thing happened the last time she had dental work.  (pt reports that syncopal episode was a day later)  Pt concerned that it is lidocaine.   Past Medical History  Diagnosis Date  . Hypertension   . Obesity   . Allergic rhinitis   . Lumbar disc disease    Past Surgical History  Procedure Laterality Date  . Appendectomy    . Cholecystectomy    . Laparoscopic gastric banding  August 18, 2010   Family History  Problem Relation Age of Onset  . Hypertension Mother   . Hyperlipidemia Mother   . Diabetes Mother   . Cancer Mother     breast   . Obesity Mother   . Hypertension Brother    History  Substance Use Topics  . Smoking status: Never Smoker   . Smokeless tobacco: Not on file  . Alcohol Use: No   OB History   Grav Para Term Preterm Abortions TAB SAB Ect Mult Living                 Review of Systems  Cardiovascular: Positive for syncope. Negative for chest pain.  Gastrointestinal: Negative for vomiting.  Neurological: Negative for focal  weakness and headaches.  All other systems reviewed and are negative.     Allergies  Hydrocodone-acetaminophen  Home Medications   Prior to Admission medications   Medication Sig Start Date End Date Taking? Authorizing Provider  amLODipine (NORVASC) 10 MG tablet Take 10 mg by mouth daily.   Yes Historical Provider, MD  ibuprofen (ADVIL,MOTRIN) 200 MG tablet Take 800 mg by mouth every 6 (six) hours as needed for mild pain.   Yes Historical Provider, MD  oxyCODONE-acetaminophen (PERCOCET/ROXICET) 5-325 MG per tablet Take 1 tablet by mouth every 6 (six) hours as needed for moderate pain or severe pain.   Yes Historical Provider, MD  penicillin v potassium (VEETID) 500 MG tablet Take 500 mg by mouth every 6 (six) hours. Initial dose of 2 tablets once, then to take one tablet every 6 hours until gone   Yes Historical Provider, MD  triamterene-hydrochlorothiazide (MAXZIDE) 75-50 MG per tablet Take 1 tablet by mouth daily.   Yes Historical Provider, MD   BP 120/62  Pulse 65  Temp(Src) 97.7 F (36.5 C) (Oral)  Resp 21  Ht 5\' 3"  (1.6 m)  Wt 220 lb (99.791 kg)  BMI 38.98 kg/m2  SpO2 100%  LMP 08/16/2014 Physical Exam  Nursing note and vitals reviewed. Constitutional: She is oriented to person, place, and time. She appears well-developed and well-nourished.  HENT:  Head: Normocephalic and atraumatic.  Right Ear: External ear normal.  Left Ear: External ear normal.  Nose: Nose normal.  Mouth/Throat: Oropharynx is clear and moist.  Eyes: EOM are normal. Pupils are equal, round, and reactive to light.  Neck: Normal range of motion.  Cardiovascular: Normal rate and regular rhythm.   Pulmonary/Chest: Effort normal and breath sounds normal.  Abdominal: Soft. She exhibits no distension.  Musculoskeletal: Normal range of motion.  Neurological: She is alert and oriented to person, place, and time.  Skin: Skin is warm.  Psychiatric: She has a normal mood and affect.    ED Course   Procedures (including critical care time) Labs Review Labs Reviewed  COMPREHENSIVE METABOLIC PANEL - Abnormal; Notable for the following:    Potassium 3.2 (*)    Glucose, Bld 110 (*)    Creatinine, Ser 1.33 (*)    Total Bilirubin 0.2 (*)    GFR calc non Af Amer 50 (*)    GFR calc Af Amer 58 (*)    All other components within normal limits  CBC WITH DIFFERENTIAL  URINALYSIS, ROUTINE W REFLEX MICROSCOPIC  I-STAT TROPOININ, ED  I-STAT BETA HCG BLOOD, ED ([facility], [facility], [facility] ONLY)    Imaging Review No results found.   EKG Interpretation   Date/Time:  Tuesday August 23 2014 20:00:18 EDT Ventricular Rate:  59 PR Interval:  167 QRS Duration: 91 QT Interval:  450 QTC Calculation: 446 R Axis:   -32 Text Interpretation:  Sinus bradycardia Left ventricular hypertrophy  Confirmed by Wilson Singer  MD, STEPHEN ([id number]) on 08/23/2014 8:04:18 PM      MDM k is 3.2.  Pt reports she is suppose to take potasium but can not tolerate it due to a lapband surgery.   Pt given Iv potassium here.   Pt reports she feels back to normal.  Pt received 1 liter of fluid.     Final diagnoses:  Syncope and collapse    Follow up with your Md for recheck    Fransico Meadow, PA-C 08/24/14 0015

## 2014-08-24 NOTE — ED Provider Notes (Signed)
Medical screening examination/treatment/procedure(s) were performed by non-physician practitioner and as supervising physician I was immediately available for consultation/collaboration.   EKG Interpretation   Date/Time:  Tuesday August 23 2014 20:00:18 EDT Ventricular Rate:  59 PR Interval:  167 QRS Duration: 91 QT Interval:  450 QTC Calculation: 446 R Axis:   -32 Text Interpretation:  Sinus bradycardia Left ventricular hypertrophy  Confirmed by Wilson Singer  MD, Elian Gloster ([id number]) on 08/23/2014 8:04:18 PM       Virgel Manifold, MD 08/24/14 1121

## 2014-08-24 NOTE — Discharge Instructions (Signed)
Driving and Equipment Restrictions Some medical problems make it dangerous to drive, ride a bike, or use machines. Some of these problems are:  A hard blow to the head (concussion).  Passing out (fainting).  Twitching and shaking (seizures).  Low blood sugar.  Taking medicine to help you relax (sedatives).  Taking pain medicines.  Wearing an eye patch.  Wearing splints. This can make it hard to use parts of your body that you need to drive safely. HOME CARE   Do not drive until your doctor says it is okay.  Do not use machines until your doctor says it is okay. You may need a form signed by your doctor (medical release) before you can drive again. You may also need this form before you do other tasks where you need to be fully alert. MAKE SURE YOU:  Understand these instructions.  Will watch your condition.  Will get help right away if you are not doing well or get worse. Document Released: 11/21/2004 Document Revised: 01/06/2012 Document Reviewed: 02/21/2010 [facility] Patient Information 2015 [place], [place]. This information is not intended to replace advice given to you by your health care provider. Make sure you discuss any questions you have with your health care provider. Syncope Syncope is a medical term for fainting or passing out. This means you lose consciousness and drop to the ground. People are generally unconscious for less than 5 minutes. You may have some muscle twitches for up to 15 seconds before waking up and returning to normal. Syncope occurs more often in older adults, but it can happen to anyone. While most causes of syncope are not dangerous, syncope can be a sign of a serious medical problem. It is important to seek medical care.  CAUSES  Syncope is caused by a sudden drop in blood flow to the brain. The specific cause is often not determined. Factors that can bring on syncope include: Taking medicines that lower blood pressure. Sudden changes in posture,  such as standing up quickly. Taking more medicine than prescribed. Standing in one place for too long. Seizure disorders. Dehydration and excessive exposure to heat. Low blood sugar (hypoglycemia). Straining to have a bowel movement. Heart disease, irregular heartbeat, or other circulatory problems. Fear, emotional distress, seeing blood, or severe pain. SYMPTOMS  Right before fainting, you may: Feel dizzy or light-headed. Feel nauseous. See all white or all black in your field of vision. Have cold, clammy skin. DIAGNOSIS  Your health care provider will ask about your symptoms, perform a physical exam, and perform an electrocardiogram (ECG) to record the electrical activity of your heart. Your health care provider may also perform other heart or blood tests to determine the cause of your syncope which may include: Transthoracic echocardiogram (TTE). During echocardiography, sound waves are used to evaluate how blood flows through your heart. Transesophageal echocardiogram (TEE). Cardiac monitoring. This allows your health care provider to monitor your heart rate and rhythm in real time. Holter monitor. This is a portable device that records your heartbeat and can help diagnose heart arrhythmias. It allows your health care provider to track your heart activity for several days, if needed. Stress tests by exercise or by giving medicine that makes the heart beat faster. TREATMENT  In most cases, no treatment is needed. Depending on the cause of your syncope, your health care provider may recommend changing or stopping some of your medicines. HOME CARE INSTRUCTIONS Have someone stay with you until you feel stable. Do not drive, use machinery, or  play sports until your health care provider says it is okay. Keep all follow-up appointments as directed by your health care provider. Lie down right away if you start feeling like you might faint. Breathe deeply and steadily. Wait until all the  symptoms have passed. Drink enough fluids to keep your urine clear or pale yellow. If you are taking blood pressure or heart medicine, get up slowly and take several minutes to sit and then stand. This can reduce dizziness. SEEK IMMEDIATE MEDICAL CARE IF:  You have a severe headache. You have unusual pain in the chest, abdomen, or back. You are bleeding from your mouth or rectum, or you have black or tarry stool. You have an irregular or very fast heartbeat. You have pain with breathing. You have repeated fainting or seizure-like jerking during an episode. You faint when sitting or lying down. You have confusion. You have trouble walking. You have severe weakness. You have vision problems. If you fainted, call your local emergency services (911 in [place] Do not drive yourself to the hospital.  MAKE SURE YOU: Understand these instructions. Will watch your condition. Will get help right away if you are not doing well or get worse. Document Released: 10/14/2005 Document Revised: 10/19/2013 Document Reviewed: 12/13/2011 [facility] Patient Information 2015 [place], [place]. This information is not intended to replace advice given to you by your health care provider. Make sure you discuss any questions you have with your health care provider.

## 2014-08-24 NOTE — ED Notes (Signed)
Encouraged pt to drink extra fluids before her next dental appt, be aware of possibility of her blood pressure dropping with narcotics and possibly with nausea as well. Also discussed dietary intake of potassium rich foods.

## 2014-09-06 ENCOUNTER — Ambulatory Visit: Payer: BC Managed Care – PPO | Admitting: Family Medicine

## 2014-09-12 ENCOUNTER — Ambulatory Visit (INDEPENDENT_AMBULATORY_CARE_PROVIDER_SITE_OTHER): Payer: BC Managed Care – PPO | Admitting: Family Medicine

## 2014-09-12 ENCOUNTER — Encounter: Payer: Self-pay | Admitting: Family Medicine

## 2014-09-12 ENCOUNTER — Ambulatory Visit (INDEPENDENT_AMBULATORY_CARE_PROVIDER_SITE_OTHER): Payer: BC Managed Care – PPO

## 2014-09-12 VITALS — BP 128/74 | HR 78 | Resp 16 | Ht 62.0 in | Wt 226.0 lb

## 2014-09-12 DIAGNOSIS — Z23 Encounter for immunization: Secondary | ICD-10-CM

## 2014-09-12 DIAGNOSIS — E559 Vitamin D deficiency, unspecified: Secondary | ICD-10-CM

## 2014-09-12 DIAGNOSIS — I10 Essential (primary) hypertension: Secondary | ICD-10-CM

## 2014-09-12 DIAGNOSIS — R55 Syncope and collapse: Secondary | ICD-10-CM | POA: Insufficient documentation

## 2014-09-12 DIAGNOSIS — R7309 Other abnormal glucose: Secondary | ICD-10-CM

## 2014-09-12 DIAGNOSIS — E785 Hyperlipidemia, unspecified: Secondary | ICD-10-CM

## 2014-09-12 DIAGNOSIS — J42 Unspecified chronic bronchitis: Secondary | ICD-10-CM

## 2014-09-12 DIAGNOSIS — R0989 Other specified symptoms and signs involving the circulatory and respiratory systems: Secondary | ICD-10-CM | POA: Insufficient documentation

## 2014-09-12 DIAGNOSIS — R7303 Prediabetes: Secondary | ICD-10-CM

## 2014-09-12 NOTE — Patient Instructions (Addendum)
F/u in 4 month, call if you need me before  You are referred to cardiology and neurology as discussed re episodes of "passing out" , also for a carotid ultrasound for eval of blockage  Please work on lifestyle change to improve health  HBA1C, chem 7 and eEGFR, lipid and TSH  Today  cXR today., also please    take sputum to lab for culture when available Flu vaccine today  Hope you start feeling better soon

## 2014-09-12 NOTE — Progress Notes (Signed)
   Subjective:    Patient ID: [name], female    DOB: 1976-06-03, 38 y.o.   MRN: [id number]  HPI Pt in for ED follow up. Passed out in the grocery store  The evening of 08/23/2014 states this occurred twice within at most a 20 minute interval. Denies palpitations, chest tightness, or inadequate hydration, taken to the Ed , labs and EKG were essentially non revealing, this is not the first time she has had an episode of this kind States she has been eating regularly and was not dehydrated at the time No associated chest tightness or irregular heartbeat, no incontinence, no h/o jerking  Review of Systems See HPI Denies recent fever or chills. Denies sinus pressure, nasal congestion, ear pain or sore throat. Denies chest congestion, productive cough or wheezing. Denies chest pains, palpitations, PND , orthopnea  and leg swelling Denies abdominal pain, nausea, vomiting,diarrhea or constipation.   Denies dysuria, frequency, hesitancy or incontinence. Denies joint pain, swelling and limitation in mobility. Denies headaches, seizures, numbness, or tingling. Denies depression, anxiety or insomnia. Denies skin break down or rash.        Objective:   Physical Exam  BP 128/74 mmHg  Pulse 78  Resp 16  Ht 5\' 2"  (1.575 m)  Wt 226 lb (102.513 kg)  BMI 41.33 kg/m2  SpO2 100%  LMP 08/16/2014 Patient alert and oriented and in no cardiopulmonary distress.  HEENT: No facial asymmetry, EOMI,   oropharynx pink and moist.  Neck supple no JVD, no mass. Bruits Chest: Clear to auscultation bilaterally.  CVS: S1, S2 no murmurs, no S3.Regular rate.  ABD: Soft non tender.   Ext: No edema  MS: Adequate ROM spine, shoulders, hips and knees.  Skin: Intact, no ulcerations or rash noted.  Psych: Good eye contact, normal affect. Memory intact not anxious or depressed appearing.  CNS: CN 2-12 intact, power,  normal throughout.no focal deficits noted.       Assessment & Plan:    Syncope Recurrent syncopal episode , witnessed in public space, refer to both cardiology and neurology for further evaluation   Chronic bronchitis Reports chronic cough, pt needs CXr and sputum c/s   Essential hypertension Controlled, no change in medication DASH diet and commitment to daily physical activity for a minimum of 30 minutes discussed and encouraged, as a part of hypertension management. The importance of attaining a healthy weight is also discussed.    Hyperlipemia Uncontrolled Hyperlipidemia:Low fat diet discussed and encouraged.  Updated lab needed at/ before next visit.    Prediabetes Patient educated about the importance of limiting  Carbohydrate intake , the need to commit to daily physical activity for a minimum of 30 minutes , and to commit weight loss. The fact that changes in all these areas will reduce or eliminate all together the development of diabetes is stressed.   Updated lab needed at/ before next visit.    Need for prophylactic vaccination and inoculation against influenza V Vaccine administered at visit.

## 2014-09-13 ENCOUNTER — Ambulatory Visit: Payer: BC Managed Care – PPO | Admitting: Family Medicine

## 2014-09-13 ENCOUNTER — Ambulatory Visit (HOSPITAL_COMMUNITY)
Admission: RE | Admit: 2014-09-13 | Discharge: 2014-09-13 | Disposition: A | Discharge status: Home / Self Care | Payer: BC Managed Care – PPO | Source: Ambulatory Visit | Attending: Family Medicine | Admitting: Family Medicine

## 2014-09-13 DIAGNOSIS — R0989 Other specified symptoms and signs involving the circulatory and respiratory systems: Secondary | ICD-10-CM | POA: Insufficient documentation

## 2014-09-13 DIAGNOSIS — R42 Dizziness and giddiness: Secondary | ICD-10-CM | POA: Insufficient documentation

## 2014-09-13 LAB — COMPLETE METABOLIC PANEL WITH GFR
ALK PHOS: 69 U/L (ref 39–117)
ALT: 12 U/L (ref 0–35)
AST: 14 U/L (ref 0–37)
Albumin: 4.1 g/dL (ref 3.5–5.2)
BUN: 14 mg/dL (ref 6–23)
CALCIUM: 9.8 mg/dL (ref 8.4–10.5)
CO2: 29 mEq/L (ref 19–32)
CREATININE: 1.12 mg/dL — AB (ref 0.50–1.10)
Chloride: 98 mEq/L (ref 96–112)
GFR, Est African American: 72 mL/min
GFR, Est Non African American: 62 mL/min
Glucose, Bld: 87 mg/dL (ref 70–99)
Potassium: 4 mEq/L (ref 3.5–5.3)
SODIUM: 137 meq/L (ref 135–145)
TOTAL PROTEIN: 7.4 g/dL (ref 6.0–8.3)
Total Bilirubin: 0.4 mg/dL (ref 0.2–1.2)

## 2014-09-13 LAB — TSH: TSH: 1.608 u[IU]/mL (ref 0.350–4.500)

## 2014-09-13 LAB — LIPID PANEL
Cholesterol: 235 mg/dL — ABNORMAL HIGH (ref 0–200)
HDL: 44 mg/dL (ref >39)
LDL Cholesterol: 175 mg/dL — ABNORMAL HIGH (ref 0–99)
TRIGLYCERIDES: 80 mg/dL (ref <150)
Total CHOL/HDL Ratio: 5.3 Ratio
VLDL: 16 mg/dL (ref 0–40)

## 2014-09-13 LAB — HEMOGLOBIN A1C
Hgb A1c MFr Bld: 5.8 % — ABNORMAL HIGH (ref <5.7)
Mean Plasma Glucose: 120 mg/dL — ABNORMAL HIGH (ref <117)

## 2014-09-14 ENCOUNTER — Encounter: Payer: Self-pay | Admitting: Family Medicine

## 2014-09-14 MED ORDER — ATORVASTATIN CALCIUM 20 MG PO TABS: 20.0000 mg | ORAL_TABLET | Freq: Every day | ORAL | Status: DC

## 2014-09-27 DIAGNOSIS — R55 Syncope and collapse: Secondary | ICD-10-CM

## 2014-09-27 HISTORY — DX: Syncope and collapse: R55

## 2014-09-28 ENCOUNTER — Ambulatory Visit (INDEPENDENT_AMBULATORY_CARE_PROVIDER_SITE_OTHER): Payer: BC Managed Care – PPO | Admitting: Neurology

## 2014-09-28 ENCOUNTER — Encounter: Payer: Self-pay | Admitting: Neurology

## 2014-09-28 VITALS — BP 117/77 | HR 58 | Ht 62.0 in | Wt 227.0 lb

## 2014-09-28 DIAGNOSIS — R55 Syncope and collapse: Secondary | ICD-10-CM

## 2014-09-28 NOTE — Progress Notes (Signed)
[facility]    Provider:  Dr Jaynee Eagles Referring Provider: Fayrene Helper, MD Primary Care Physician:  Tula Nakayama, MD  CC:  Syncope  HPI:  [name] is a 38 y.o. female here as a referral from Dr. Moshe Cipro for syncope. PMHx obesity, HLD, glucose intolerance (elevated HgbA1c), lap band surgery. Patient is here for syncopal episodes. she had a dental procedure done a month ago and the next day she had a lot of pain and she started pain medicine and antibiotics. That evening she went to the grocery store and she passed out. She became light headed putting groceries on the conveyer belt, sweaty, felt the lights dimming and then remembers being on the ground and someone said "call the ambulance". It was very brief. She got up and the same thing happened. No seizure-like symptoms, no confusion, she had a work up at the hospital. Happened 5 days later again, she was at [employer] and it was brief. She has a cardiology appointment. No FHx seizures, never lost time, no staring episodes, no confusional episodes, never lost urine or woke up with a bit tongue. She has had dizzy spells, she starts sweating, but hasn't passed out. No focal neuro deficits, no present or history of vision loss, weakness, paresthesias. Never had seizures. She is healthy. No FHx of neurologic disorders. Denies CP, Palpitations, SOB.   Reviewed notes, labs and imaging from outside physicians, which showed: She was evaluated at the ED on 10/27. EKG showed sinus bradycardia and left ventricular hypertrophy. She was given fluid. TSh WNL, HgbA1c 5.8, LDL 175, CMP with bun 14 and creatinine 1.12(appears to be baseline) and GFR 72,  otherwise unremarkable.   Review of Systems: Patient complains of symptoms per HPI as well as the following symptoms: No CP, No SOB, No Palpitations, no Rash. Pertinent negatives per HPI. All others negative.   History   Social History  . Marital Status: Married   Spouse Name: N/A    Number of Children: 2  . Years of Education: college   Occupational History  . Not on file.   Social History Main Topics  . Smoking status: Never Smoker   . Smokeless tobacco: Never Used  . Alcohol Use: No  . Drug Use: Yes  . Sexual Activity: Not on file   Other Topics Concern  . Not on file   Social History Narrative   Patient is married and lives at home with her husband.        Family History  Problem Relation Age of Onset  . Hypertension Mother   . Hyperlipidemia Mother   . Diabetes Mother   . Cancer Mother     breast   . Obesity Mother   . Hypertension Brother     Past Medical History  Diagnosis Date  . Hypertension   . Obesity   . Allergic rhinitis   . Lumbar disc disease     Past Surgical History  Procedure Laterality Date  . Appendectomy    . Cholecystectomy    . Laparoscopic gastric banding  August 18, 2010    Current Outpatient Prescriptions  Medication Sig Dispense Refill  . amLODipine (NORVASC) 10 MG tablet Take 10 mg by mouth daily.    Marland Kitchen atorvastatin (LIPITOR) 20 MG tablet Take 1 tablet (20 mg total) by mouth daily. 30 tablet 3  . triamterene-hydrochlorothiazide (MAXZIDE) 75-50 MG per tablet Take 1 tablet by mouth daily.     Current Facility-Administered Medications  Medication Dose  Route Frequency Provider Last Rate Last Dose  . Influenza (>/= 3 years) inactive virus vaccine (FLVIRIN/FLUZONE) injection SUSP 0.5 mL  0.5 mL Intramuscular Once Fayrene Helper, MD        Allergies as of 09/28/2014 - Review Complete 09/28/2014  Allergen Reaction Noted  . Hydrocodone-acetaminophen Other (See Comments) 03/06/2009    Vitals: BP 117/77 mmHg  Pulse 58  Ht 5\' 2"  (1.575 m)  Wt 227 lb (102.967 kg)  BMI 41.51 kg/m2 Last Weight:  Wt Readings from Last 1 Encounters:  09/28/14 227 lb (102.967 kg)   Last Height:   Ht Readings from Last 1 Encounters:  09/28/14 5\' 2"  (1.575 m)    Physical exam: Exam: Gen: NAD,  conversant, well nourised, obese, well groomed                     CV: Regular rhythm, bradycardic, no MRG. No peripheral edema, warm, nontender Eyes: Conjunctivae clear without exudates or hemorrhage  Neuro: Detailed Neurologic Exam  Speech:    Speech is normal; fluent and spontaneous with normal comprehension.  Cognition:    The patient is oriented to person, place, and time;     recent and remote memory intact;     language fluent;     normal attention, concentration,     fund of knowledge Cranial Nerves:    The pupils are equal, round, and reactive to light. The fundi are normal and spontaneous venous pulsations are present. Visual fields are full to finger confrontation. Extraocular movements are intact. Trigeminal sensation is intact and the muscles of mastication are normal. The face is symmetric. The palate elevates in the midline. Voice is normal. Shoulder shrug is normal. The tongue has normal motion without fasciculations.   Coordination:    Normal finger to nose and heel to shin. Normal rapid alternating movements.   Gait:    Heel-toe and tandem gait are normal.   Motor Observation:    No asymmetry, no atrophy, and no involuntary movements noted. Tone:    Normal muscle tone.    Posture:    Posture is normal. normal erect    Strength:    Strength is V/V in the upper and lower limbs.      Sensation: intact     Reflex Exam:  DTR's:    Deep tendon reflexes in the upper and lower extremities are normal bilaterally.   Toes:    The toes are downgoing bilaterally.   Clonus:    Clonus is absent.      Assessment/Plan:  CHARLENA HAUB is a 38 y.o. female here as a referral from Dr. Moshe Cipro for 3 recent syncopal episodes. PMHx obesity, HLD, glucose intolerance (elevated HgbA1c), lap band surgery. Becomes light headed, sweaty, feels the lights dimming and then loses consciousness.  No confusion afterwards.  No seizure-like movements, no loss of urine of biting  tongue. No FHx or personal history of seizures, never lost time, no staring episodes, no confusional episodes, never lost urine or woke up with a bit tongue.No focal neuro deficits, no present or history of vision loss, weakness, paresthesias. Never had seizures. She is healthy. No FHx of neurologic disorders. Sounds more vaso vagal or cardiogenic but should evaluate with EEG and MRI of the brian w/wo contrast. Agree with cardiology consult.   Reviewed notes, labs and imaging from outside physicians, which showed: She was evaluated at the ED on 10/27. EKG showed sinus bradycardia and left ventricular hypertrophy. She was given fluid. TSh  WNL, HgbA1c 5.8, LDL 175, CMP with bun 14 and creatinine 1.12(appears to be baseline) and GFR 72,  otherwise unremarkable.    Sarina Ill, MD  [facility] [address] [place], [place] [address]  Phone [phone] Fax [phone]

## 2014-09-28 NOTE — Procedures (Signed)
    History:  [name] is a 38 year old patient with a history of syncopal events. The patient had diaphoresis, and dimming of vision prior to the loss of consciousness. The patient being evaluated for these episodes.  This is a routine EEG. No skull defects are noted. Medications include Norvasc, Lipitor, and Maxide.   EEG classification: Normal awake and drowsy  Description of the recording: The background rhythms of this recording consists of a fairly well modulated medium amplitude alpha rhythm of 10 Hz that is reactive to eye opening and closure. As the record progresses, the patient appears to remain in the waking state throughout the recording. Photic stimulation was performed, resulting in a bilateral and symmetric photic driving response. Hyperventilation was also performed, resulting in a minimal buildup of the background rhythm activities without significant slowing seen. Toward the end of the recording, the patient enters the drowsy state with slight symmetric slowing seen. The patient never enters stage II sleep. At no time during the recording does there appear to be evidence of spike or spike wave discharges or evidence of focal slowing. EKG monitor shows no evidence of cardiac rhythm abnormalities with a heart rate of 56.  Impression: This is a normal EEG recording in the waking and drowsy state. No evidence of ictal or interictal discharges are seen.

## 2014-09-28 NOTE — Patient Instructions (Signed)
Overall you are doing fairly well but I do want to suggest a few things today:   Remember to drink plenty of fluid, eat healthy meals and do not skip any meals. Try to eat protein with a every meal and eat a healthy snack such as fruit or nuts in between meals. Try to keep a regular sleep-wake schedule and try to exercise daily, particularly in the form of walking, 20-30 minutes a day, if you can.   As far as diagnostic testing: MRI of the brain, EEG  I would like to see you back as needed. Please call us with any interim questions, concerns, problems, updates or refill requests.   Please also call us for any test results so we can go over those with you on the phone.  My clinical assistant and will answer any of your questions and relay your messages to me and also relay most of my messages to you.   Our phone number is [phone]. We also have an after hours call service for urgent matters and there is a physician on-call for urgent questions. For any emergencies you know to call 911 or go to the nearest emergency room

## 2014-09-29 ENCOUNTER — Telehealth: Payer: Self-pay | Admitting: *Deleted

## 2014-09-29 NOTE — Telephone Encounter (Signed)
-----   Message from Melvenia Beam, MD sent at 09/28/2014  7:01 PM EST ----- Please let patient know her eeg was normal please. Thank you.

## 2014-09-29 NOTE — Telephone Encounter (Signed)
Left patient a message that EEG was normal

## 2014-10-07 ENCOUNTER — Encounter: Payer: Self-pay | Admitting: Cardiology

## 2014-10-07 ENCOUNTER — Ambulatory Visit (INDEPENDENT_AMBULATORY_CARE_PROVIDER_SITE_OTHER): Payer: BC Managed Care – PPO | Admitting: Cardiology

## 2014-10-07 VITALS — BP 116/72 | HR 57 | Ht 62.0 in | Wt 226.0 lb

## 2014-10-07 DIAGNOSIS — R55 Syncope and collapse: Secondary | ICD-10-CM

## 2014-10-07 DIAGNOSIS — R001 Bradycardia, unspecified: Secondary | ICD-10-CM | POA: Insufficient documentation

## 2014-10-07 DIAGNOSIS — I10 Essential (primary) hypertension: Secondary | ICD-10-CM

## 2014-10-07 MED ORDER — AMLODIPINE BESYLATE 5 MG PO TABS: 5.0000 mg | ORAL_TABLET | Freq: Every day | ORAL | Status: DC

## 2014-10-07 NOTE — Progress Notes (Signed)
[place], [place], [place]  [address] Phone: [phone] Fax:  [phone]  Date:  10/07/2014   ID:  [name], DOB [birth year], MRN [id number]  PCP:  Tula Nakayama, MD  Cardiologist:  Fransico Him, MD    History of Present Illness: [name] is a 38 y.o. female with a history of HTN presents today for evaluation of dizziness.  SHe says that it has been going on for about a month.  She says that in the am she feels lightheaded and wants to sit down.  When it initially started she had syncope in the grocery store and she says that her BP was low.  She had felt it coming on and felt nauseated and hot and then got dizzy and passes out.  It occurs daily and more so on the weekend.  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  The symptoms resolve if she sits down.  She denies any palpitations, chest pain or pressure, SOB or DOE.  She feels tired a lot and wants to sit or lay down.  SHe had a lap band procedure about 3 years ago and has lost 70 pounds.     Wt Readings from Last 3 Encounters:  10/07/14 226 lb (102.513 kg)  09/28/14 227 lb (102.967 kg)  09/12/14 226 lb (102.513 kg)     Past Medical History  Diagnosis Date  . Hypertension   . Obesity   . Allergic rhinitis   . Lumbar disc disease     Current Outpatient Prescriptions  Medication Sig Dispense Refill  . amLODipine (NORVASC) 10 MG tablet Take 10 mg by mouth daily.    Marland Kitchen atorvastatin (LIPITOR) 20 MG tablet Take 1 tablet (20 mg total) by mouth daily. 30 tablet 3  . triamterene-hydrochlorothiazide (MAXZIDE) 75-50 MG per tablet Take 1 tablet by mouth daily.     Current Facility-Administered Medications  Medication Dose Route Frequency Provider Last Rate Last Dose  . Influenza (>/= 3 years) inactive virus vaccine (FLVIRIN/FLUZONE) injection SUSP 0.5 mL  0.5 mL Intramuscular Once Fayrene Helper, MD        Allergies:    Allergies  Allergen Reactions  . Hydrocodone-Acetaminophen  Other (See Comments)    Tachycardia    Social History:  The patient  reports that she has never smoked. She has never used smokeless tobacco. She reports that she uses illicit drugs. She reports that she does not drink alcohol.   Family History:  The patient's family history includes Cancer in her mother; Diabetes in her mother; Hyperlipidemia in her mother; Hypertension in her brother and mother; Obesity in her mother.   ROS:  Please see the history of present illness.      All other systems reviewed and negative.   PHYSICAL EXAM: VS:  BP 116/72 mmHg  Pulse 57  Ht 5\' 2"  (1.575 m)  Wt 226 lb (102.513 kg)  BMI 41.33 kg/m2 Well nourished, well developed, in no acute distress HEENT: normal Neck: no JVD Cardiac:  normal S1, S2; RRR; no murmur Lungs:  clear to auscultation bilaterally, no wheezing, rhonchi or rales Abd: soft, nontender, no hepatomegaly Ext: no edema Skin: warm and dry Neuro:  CNs 2-12 intact, no focal abnormalities noted  EKG:  Sinus bradycardia at 57bpm with no ST changes     ASSESSMENT AND PLAN:  1. Dizziness of unclear etiology.  She is bradycardic but only minimally bradycardic.  She is orthostatic on exam  with BP dropping from 125/87 to 104/84 when standing.   2. Sinus bradycardia  - her HR did increase on her orthostatics to 85bpm. 3. HTN - her BP is on the lower side of normal and I suspect with all the weight loss she may be overmedicated.  I have asked her to drop her amlodipine back to 5mg  daily.  She will continue the diuretic since she has problems with LE edema. 4. Syncope which may be due to overmedication with her BP meds.  I will get a 2D echo to assess LVF.  I will also get a 30 day event monitor given her bradycardia on EKG.  I have asked her to decrease her amlodipine to 5mg  daily.  Followup with me in 4 weeks  Signed, Fransico Him, MD [facility] 10/07/2014 9:03 AM

## 2014-10-07 NOTE — Patient Instructions (Signed)
Your physician has requested that you have an echocardiogram. Echocardiography is a painless test that uses sound waves to create images of your heart. It provides your doctor with information about the size and shape of your heart and how well your heart's chambers and valves are working. This procedure takes approximately one hour. There are no restrictions for this procedure.  Your physician has recommended that you wear an event monitor. Event monitors are medical devices that record the heart's electrical activity. Doctors most often [place] these monitors to diagnose arrhythmias. Arrhythmias are problems with the speed or rhythm of the heartbeat. The monitor is a small, portable device. You can wear one while you do your normal daily activities. This is usually used to diagnose what is causing palpitations/syncope (passing out).  Your physician has recommended you make the following change in your medication:  1) DECREASE amlodipine to 5 mig daily  Your physician recommends that you schedule a follow-up appointment in: 1 month with Dr. Radford Pax.

## 2014-10-14 ENCOUNTER — Ambulatory Visit (HOSPITAL_COMMUNITY): Payer: BC Managed Care – PPO | Attending: Cardiology | Admitting: Radiology

## 2014-10-14 ENCOUNTER — Encounter (INDEPENDENT_AMBULATORY_CARE_PROVIDER_SITE_OTHER): Payer: BC Managed Care – PPO

## 2014-10-14 ENCOUNTER — Encounter: Payer: Self-pay | Admitting: Radiology

## 2014-10-14 DIAGNOSIS — R001 Bradycardia, unspecified: Secondary | ICD-10-CM

## 2014-10-14 DIAGNOSIS — R55 Syncope and collapse: Secondary | ICD-10-CM

## 2014-10-14 NOTE — Progress Notes (Signed)
Echocardiogram performed.  

## 2014-10-14 NOTE — Progress Notes (Signed)
lifewatch 30 day monitor applied. EOS 11-14-14

## 2014-11-21 ENCOUNTER — Ambulatory Visit (INDEPENDENT_AMBULATORY_CARE_PROVIDER_SITE_OTHER): Payer: BC Managed Care – PPO | Admitting: Cardiology

## 2014-11-21 ENCOUNTER — Encounter: Payer: Self-pay | Admitting: Cardiology

## 2014-11-21 VITALS — BP 124/80 | HR 92 | Ht 62.0 in | Wt 227.8 lb

## 2014-11-21 DIAGNOSIS — I10 Essential (primary) hypertension: Secondary | ICD-10-CM

## 2014-11-21 DIAGNOSIS — R55 Syncope and collapse: Secondary | ICD-10-CM

## 2014-11-21 DIAGNOSIS — R001 Bradycardia, unspecified: Secondary | ICD-10-CM

## 2014-11-21 MED ORDER — AMLODIPINE BESYLATE 5 MG PO TABS: 5.0000 mg | ORAL_TABLET | Freq: Every day | ORAL | Status: DC

## 2014-11-21 NOTE — Patient Instructions (Signed)
Your physician recommends that you continue on your current medications as directed. Please refer to the Current Medication list given to you today.  Your physician recommends that you schedule a follow-up appointment AS NEEDED with Dr. Radford Pax.

## 2014-11-21 NOTE — Progress Notes (Signed)
Cardiology Office Note   Date:  11/21/2014   ID:  MASSIAH LONGANECKER, DOB 1976-04-10, MRN [id number]  PCP:  Tula Nakayama, MD  Cardiologist:   Sueanne Margarita, MD   Chief Complaint  Patient presents with   Dizziness          History of Present Illness: [name] is a 39 y.o. female with a history of HTN presents today for followup of dizziness. She says thatit has been going on for about a month. She says that in the am she feels lightheaded and wants to sit down. When it initially started she had syncope in the grocery store and she says that her BP was low. She had felt it coming on and felt nauseated and hot and then got dizzy and passes out. It occurs daily and more so on the weekend. 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 The symptoms resolve if she sits down. She denies any palpitations, chest pain or pressure, SOB or DOE. She feels tired a lot and wants to sit or lay down. SHe had a lap band procedure about 3 years ago and has lost 70 pounds. On last OV we decreased her amlodipine does thinking her dizziness was from low BP. Since then she has not had any further episodes of dizziness and no syncope.     Past Medical History  Diagnosis Date  . Hypertension   . Obesity   . Allergic rhinitis   . Lumbar disc disease     Past Surgical History  Procedure Laterality Date  . Appendectomy    . Cholecystectomy    . Laparoscopic gastric banding  August 18, 2010     Current Outpatient Prescriptions  Medication Sig Dispense Refill  . amLODipine (NORVASC) 5 MG tablet Take 1 tablet (5 mg total) by mouth daily. (Patient taking differently: Take 5 mg by mouth daily. ) 30 tablet 6  . atorvastatin (LIPITOR) 20 MG tablet Take 1 tablet (20 mg total) by mouth daily. (Patient taking differently: Take 20 mg by mouth daily. ) 30 tablet 3  . triamterene-hydrochlorothiazide (MAXZIDE) 75-50 MG per tablet Take 1 tablet by mouth daily.      Current  Facility-Administered Medications  Medication Dose Route Frequency Provider Last Rate Last Dose  . Influenza (>/= 3 years) inactive virus vaccine (FLVIRIN/FLUZONE) injection SUSP 0.5 mL  0.5 mL Intramuscular Once Fayrene Helper, MD        Allergies:   Hydrocodone-acetaminophen    Social History:  The patient  reports that she has never smoked. She has never used smokeless tobacco. She reports that she uses illicit drugs. She reports that she does not drink alcohol.   Family History:  The patient's family history includes Cancer in her mother; Diabetes in her mother; Heart attack in her father; Heart disease in her father; Hyperlipidemia in her mother; Hypertension in her brother and mother; Obesity in her mother; Stroke in her brother.    ROS:  Please see the history of present illness.   Otherwise, review of systems are positive for none.   All other systems are reviewed and negative.    PHYSICAL EXAM: VS:  BP 124/80 mmHg  Pulse 92  Ht 5\' 2"  (1.575 m)  Wt 227 lb 12.8 oz (103.329 kg)  BMI 41.65 kg/m2  SpO2 99% , BMI Body mass index is 41.65 kg/(m^2). GEN: Well nourished, well developed, in no acute distress HEENT: normal Neck: no JVD, carotid bruits, or  masses Cardiac: RRR; no murmurs, rubs, or gallops,trace LE edema  Respiratory:  clear to auscultation bilaterally, normal work of breathing GI: soft, nontender, nondistended, + BS MS: no deformity or atrophy Skin: warm and dry, no rash Neuro:  Strength and sensation are intact Psych: euthymic mood, full affect   EKG:  EKG is not ordered today.    Recent Labs: 08/23/2014: Hemoglobin 14.2; Platelets 247 09/12/2014: ALT 12; BUN 14; Creatinine 1.12*; Potassium 4.0; Sodium 137; TSH 1.608    Lipid Panel    Component Value Date/Time   CHOL 235* 09/12/2014 1028   TRIG 80 09/12/2014 1028   HDL 44 09/12/2014 1028   CHOLHDL 5.3 09/12/2014 1028   VLDL 16 09/12/2014 1028   LDLCALC 175* 09/12/2014 1028      Wt Readings  from Last 3 Encounters:  11/21/14 227 lb 12.8 oz (103.329 kg)  10/07/14 226 lb (102.513 kg)  09/28/14 227 lb (102.967 kg)      Other studies Reviewed: Additional studies/ records that were reviewed today include: event monitor and 2D echo Review of the above records demonstrates: normal LVF with mild MR and   ASSESSMENT AND PLAN:  1. Dizziness of unclear etiology.  2. Sinus bradycardia - resolved 3. HTN - well controlled on amlodipine.  She will continue the diuretic since she has problems with LE edema.   4.  Syncope which may be due to overmedication with her BP meds. No further episodes after decreasing dose of amlodipine.  2D echo showed normal LVF with mild MR.    Current medicines are reviewed at length with the patient today.  The patient does not have concerns regarding medicines.  The following changes have been made:  no change  Labs/ tests ordered today include: None       Disposition:   FU with me in  PRN   Signed, Sueanne Margarita, MD  11/21/2014 2:07 [facility] [place], [place], [place]  [address] Phone: [phone]; Fax: [phone]

## 2014-11-21 NOTE — Addendum Note (Signed)
Addended by: Harland German A on: 11/21/2014 02:48 PM   Modules accepted: Orders

## 2014-12-04 DIAGNOSIS — R55 Syncope and collapse: Secondary | ICD-10-CM | POA: Insufficient documentation

## 2014-12-04 NOTE — Assessment & Plan Note (Addendum)
Recurrent syncopal episode , witnessed in public space, refer to both cardiology and neurology for further evaluation

## 2014-12-05 DIAGNOSIS — Z23 Encounter for immunization: Secondary | ICD-10-CM | POA: Insufficient documentation

## 2014-12-05 NOTE — Assessment & Plan Note (Signed)
V Vaccine administered at visit.

## 2014-12-05 NOTE — Assessment & Plan Note (Signed)
Reports chronic cough, pt needs CXr and sputum c/s

## 2014-12-05 NOTE — Assessment & Plan Note (Signed)
Controlled, no change in medication DASH diet and commitment to daily physical activity for a minimum of 30 minutes discussed and encouraged, as a part of hypertension management. The importance of attaining a healthy weight is also discussed.  

## 2014-12-05 NOTE — Assessment & Plan Note (Signed)
Uncontrolled. Hyperlipidemia:Low fat diet discussed and encouraged.  Updated lab needed at/ before next visit.  

## 2014-12-05 NOTE — Assessment & Plan Note (Signed)
Patient educated about the importance of limiting  Carbohydrate intake , the need to commit to daily physical activity for a minimum of 30 minutes , and to commit weight loss. The fact that changes in all these areas will reduce or eliminate all together the development of diabetes is stressed.   Updated lab needed at/ before next visit.  

## 2014-12-16 ENCOUNTER — Other Ambulatory Visit: Payer: Self-pay

## 2014-12-16 ENCOUNTER — Other Ambulatory Visit: Payer: Self-pay | Admitting: Cardiology

## 2014-12-16 ENCOUNTER — Encounter: Payer: Self-pay | Admitting: Cardiology

## 2014-12-16 ENCOUNTER — Encounter: Payer: Self-pay | Admitting: Family Medicine

## 2014-12-16 MED ORDER — TRIAMTERENE-HCTZ 75-50 MG PO TABS: 1.0000 | ORAL_TABLET | Freq: Every day | ORAL | Status: DC

## 2014-12-16 MED ORDER — AMLODIPINE BESYLATE 5 MG PO TABS: 5.0000 mg | ORAL_TABLET | Freq: Every day | ORAL | Status: DC

## 2015-01-11 ENCOUNTER — Other Ambulatory Visit: Payer: Self-pay | Admitting: Family Medicine

## 2015-01-12 ENCOUNTER — Other Ambulatory Visit: Payer: Self-pay

## 2015-01-12 MED ORDER — ATORVASTATIN CALCIUM 20 MG PO TABS: 20.0000 mg | ORAL_TABLET | Freq: Every day | ORAL | Status: DC

## 2015-01-17 ENCOUNTER — Ambulatory Visit: Payer: BC Managed Care – PPO | Admitting: Family Medicine

## 2015-01-17 ENCOUNTER — Encounter: Payer: Self-pay | Admitting: *Deleted

## 2015-02-22 ENCOUNTER — Ambulatory Visit: Payer: BC Managed Care – PPO | Admitting: Family Medicine

## 2015-03-09 ENCOUNTER — Ambulatory Visit (INDEPENDENT_AMBULATORY_CARE_PROVIDER_SITE_OTHER): Payer: BC Managed Care – PPO | Admitting: Family Medicine

## 2015-03-09 ENCOUNTER — Encounter: Payer: Self-pay | Admitting: Family Medicine

## 2015-03-09 VITALS — BP 122/82 | HR 78 | Resp 16 | Ht 62.0 in | Wt 232.0 lb

## 2015-03-09 DIAGNOSIS — R7303 Prediabetes: Secondary | ICD-10-CM

## 2015-03-09 DIAGNOSIS — E785 Hyperlipidemia, unspecified: Secondary | ICD-10-CM | POA: Diagnosis not present

## 2015-03-09 DIAGNOSIS — E8881 Metabolic syndrome: Secondary | ICD-10-CM

## 2015-03-09 DIAGNOSIS — I10 Essential (primary) hypertension: Secondary | ICD-10-CM | POA: Diagnosis not present

## 2015-03-09 DIAGNOSIS — R7309 Other abnormal glucose: Secondary | ICD-10-CM

## 2015-03-09 DIAGNOSIS — E559 Vitamin D deficiency, unspecified: Secondary | ICD-10-CM | POA: Diagnosis not present

## 2015-03-09 DIAGNOSIS — K529 Noninfective gastroenteritis and colitis, unspecified: Secondary | ICD-10-CM

## 2015-03-09 NOTE — Patient Instructions (Signed)
F/u in 4.5 months, call if you need me before  You are referred to [place] surgery re lap band re eval   We will refer you to nutrition counselor in [place] for help with weight management   It is important that you exercise regularly at least 30 minutes 5 times a week. If you develop chest pain, have severe difficulty breathing, or feel very tired, stop exercising immediately and seek medical attention   BP is great  Fasting labs this week , add vit D  Work excuse form 05/10 and this am   It is important that you exercise regularly at least 30 minutes 5 times a week. If you develop chest pain, have severe difficulty breathing, or feel very tired, stop exercising immediately and seek medical attention

## 2015-03-09 NOTE — Progress Notes (Signed)
Subjective:    Patient ID: [name], female    DOB: [birth year], 39 y.o.   MRN: [id number]  HPI The PT is here for follow up and re-evaluation of chronic medical conditions, medication management and review of any available recent lab and radiology data.  Preventive health is updated, specifically  Cancer screening and Immunization.    The PT denies any adverse reactions to current medications since the last visit.  Was acutely ill with GI symptoms 2 days ago, had to be out one day, gradually improving. No success with weight loss, will refer for re eval of her lap band , to a nutritionist and she is to commit to more regular exercise. Has "alot of running" with her 2 children     Review of Systems See HPI Denies recent fever or chills. Denies sinus pressure, nasal congestion, ear pain or sore throat. Denies chest congestion, productive cough or wheezing. Denies chest pains, palpitations and leg swelling C/o mild  abdominal pain, nausea, and loose stools, gradually improving.   Denies dysuria, frequency, hesitancy or incontinence. Denies joint pain, swelling and limitation in mobility. Denies headaches, seizures, numbness, or tingling. 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        Objective:   Physical Exam BP 122/82 mmHg  Pulse 78  Resp 16  Ht 5\' 2"  (1.575 m)  Wt 232 lb (105.235 kg)  BMI 42.42 kg/m2  SpO2 100% Patient alert and oriented and in no cardiopulmonary distress.  HEENT: No facial asymmetry, EOMI,   oropharynx pink and moist.  Neck supple no JVD, no mass.  Chest: Clear to auscultation bilaterally.  CVS: S1, S2 no murmurs, no S3.Regular rate.  ABD: Soft mild superficial tenderness, no guarding or rebound, mildly hyperactive BS  Ext: No edema  MS: Adequate ROM spine, shoulders, hips and  knees.  Skin: Intact, no ulcerations or rash noted.  Psych: Good eye contact, normal affect. Memory intact not anxious or depressed appearing.  CNS: CN 2-12 intact, power,  normal throughout.no focal deficits noted.        Assessment & Plan:  Essential hypertension Controlled, no change in medication DASH diet and commitment to daily physical activity for a minimum of 30 minutes discussed and encouraged, as a part of hypertension management. The importance of attaining a healthy weight is also discussed.  BP/Weight 03/09/2015 11/21/2014 10/07/2014 09/28/2014 09/12/2014 08/24/2014 23/76/2831  Systolic BP 517 616 073 710 626 948 -  Diastolic BP 82 80 72 77 74 76 -  Wt. (Lbs) 232 227.8 226 227 226 - 220  BMI 42.42 41.65 41.33 41.51 41.33 - 38.98         Prediabetes Updated lab needed   Patient educated about the importance of limiting  Carbohydrate intake , the need to commit to daily physical activity for a minimum of 30 minutes , and to commit weight loss. The fact that changes in all these areas will reduce or eliminate all together the development of diabetes is stressed.   Diabetic Labs Latest Ref Rng 09/12/2014 08/23/2014 01/08/2014 09/29/2013 09/25/2013  HbA1c <5.7 % 5.8(H) - 5.6 6.2(H) -  Chol 0 - 200 mg/dL 235(H) - 210(H) - -  HDL >39 mg/dL 44 - 44 - -  Calc LDL 0 - 99 mg/dL 175(H) - 149(H) - -  Triglycerides <150 mg/dL 80 -  87 - -  Creatinine 0.50 - 1.10 mg/dL 1.12(H) 1.33(H) 1.10 - 1.20(H)   BP/Weight 03/09/2015 11/21/2014 10/07/2014 09/28/2014 09/12/2014 08/24/2014 13/05/6577  Systolic BP 469 629 528 413 244 010 -  Diastolic BP 82 80 72 77 74 76 -  Wt. (Lbs) 232 227.8 226 227 226 - 220  BMI 42.42 41.65 41.33 41.51 41.33 - 38.98   No flowsheet data found.      MORBID OBESITY Deteriorated.  Refer back to surgery for re eval of lap band, also refer to nutritionist. Needs to commit top more regular exercise also Patient re-educated about  the importance of  commitment to a  minimum of 150 minutes of exercise per week.  The importance of healthy food choices with portion control discussed. Encouraged to start a food diary, count calories and to consider  joining a support group. Sample diet sheets offered. Goals set by the patient for the next several months.   Weight /BMI 03/09/2015 11/21/2014 10/07/2014  WEIGHT 232 lb 227 lb 12.8 oz 226 lb  HEIGHT 5\' 2"  5\' 2"  5\' 2"   BMI 42.42 kg/m2 41.65 kg/m2 41.33 kg/m2    Current exercise per week 90 minutes.    Metabolic syndrome X The increased risk of cardiovascular disease associated with this diagnosis, and the need to consistently work on lifestyle to change this is discussed. Following  a  heart healthy diet ,commitment to 30 minutes of exercise at least 5 days per week, as well as control of blood sugar and cholesterol , and achieving a healthy weight are all the areas to be addressed .    Hyperlipemia Hyperlipidemia:Low fat diet discussed and encouraged.   Lipid Panel  Lab Results  Component Value Date   CHOL 235* 09/12/2014   HDL 44 09/12/2014   LDLCALC 175* 09/12/2014   TRIG 80 09/12/2014   CHOLHDL 5.3 09/12/2014      Updated lab needed     Gastroenteritis 3 day history, gradually improving ,had to be out of work 2 days ago, will give a note for that day and this am

## 2015-03-10 ENCOUNTER — Encounter: Payer: Self-pay | Admitting: Family Medicine

## 2015-03-10 DIAGNOSIS — K529 Noninfective gastroenteritis and colitis, unspecified: Secondary | ICD-10-CM | POA: Insufficient documentation

## 2015-03-10 NOTE — Assessment & Plan Note (Signed)
Updated lab needed   Patient educated about the importance of limiting  Carbohydrate intake , the need to commit to daily physical activity for a minimum of 30 minutes , and to commit weight loss. The fact that changes in all these areas will reduce or eliminate all together the development of diabetes is stressed.   Diabetic Labs Latest Ref Rng 09/12/2014 08/23/2014 01/08/2014 09/29/2013 09/25/2013  HbA1c <5.7 % 5.8(H) - 5.6 6.2(H) -  Chol 0 - 200 mg/dL 235(H) - 210(H) - -  HDL >39 mg/dL 44 - 44 - -  Calc LDL 0 - 99 mg/dL 175(H) - 149(H) - -  Triglycerides <150 mg/dL 80 - 87 - -  Creatinine 0.50 - 1.10 mg/dL 1.12(H) 1.33(H) 1.10 - 1.20(H)   BP/Weight 03/09/2015 11/21/2014 10/07/2014 09/28/2014 09/12/2014 08/24/2014 04/26/1600  Systolic BP 093 235 573 220 254 270 -  Diastolic BP 82 80 72 77 74 76 -  Wt. (Lbs) 232 227.8 226 227 226 - 220  BMI 42.42 41.65 41.33 41.51 41.33 - 38.98   No flowsheet data found.

## 2015-03-10 NOTE — Assessment & Plan Note (Signed)
Hyperlipidemia:Low fat diet discussed and encouraged.   Lipid Panel  Lab Results  Component Value Date   CHOL 235* 09/12/2014   HDL 44 09/12/2014   LDLCALC 175* 09/12/2014   TRIG 80 09/12/2014   CHOLHDL 5.3 09/12/2014      Updated lab needed

## 2015-03-10 NOTE — Assessment & Plan Note (Signed)
Controlled, no change in medication DASH diet and commitment to daily physical activity for a minimum of 30 minutes discussed and encouraged, as a part of hypertension management. The importance of attaining a healthy weight is also discussed.  BP/Weight 03/09/2015 11/21/2014 10/07/2014 09/28/2014 09/12/2014 08/24/2014 81/82/9937  Systolic BP 169 678 938 101 751 025 -  Diastolic BP 82 80 72 77 74 76 -  Wt. (Lbs) 232 227.8 226 227 226 - 220  BMI 42.42 41.65 41.33 41.51 41.33 - 38.98

## 2015-03-10 NOTE — Assessment & Plan Note (Signed)
Deteriorated.  Refer back to surgery for re eval of lap band, also refer to nutritionist. Needs to commit top more regular exercise also Patient re-educated about  the importance of commitment to a  minimum of 150 minutes of exercise per week.  The importance of healthy food choices with portion control discussed. Encouraged to start a food diary, count calories and to consider  joining a support group. Sample diet sheets offered. Goals set by the patient for the next several months.   Weight /BMI 03/09/2015 11/21/2014 10/07/2014  WEIGHT 232 lb 227 lb 12.8 oz 226 lb  HEIGHT 5\' 2"  5\' 2"  5\' 2"   BMI 42.42 kg/m2 41.65 kg/m2 41.33 kg/m2    Current exercise per week 90 minutes.

## 2015-03-10 NOTE — Assessment & Plan Note (Signed)
The increased risk of cardiovascular disease associated with this diagnosis, and the need to consistently work on lifestyle to change this is discussed. Following  a  heart healthy diet ,commitment to 30 minutes of exercise at least 5 days per week, as well as control of blood sugar and cholesterol , and achieving a healthy weight are all the areas to be addressed .  

## 2015-03-10 NOTE — Assessment & Plan Note (Signed)
3 day history, gradually improving ,had to be out of work 2 days ago, will give a note for that day and this am

## 2015-03-17 LAB — COMPREHENSIVE METABOLIC PANEL
ALT: 13 U/L (ref 0–35)
AST: 11 U/L (ref 0–37)
Albumin: 3.2 g/dL — ABNORMAL LOW (ref 3.5–5.2)
Alkaline Phosphatase: 74 U/L (ref 39–117)
BUN: 14 mg/dL (ref 6–23)
CALCIUM: 8.7 mg/dL (ref 8.4–10.5)
CHLORIDE: 106 meq/L (ref 96–112)
CO2: 26 meq/L (ref 19–32)
Creat: 1.05 mg/dL (ref 0.50–1.10)
Glucose, Bld: 82 mg/dL (ref 70–99)
Potassium: 3.7 mEq/L (ref 3.5–5.3)
Sodium: 136 mEq/L (ref 135–145)
Total Bilirubin: 0.4 mg/dL (ref 0.2–1.2)
Total Protein: 6.1 g/dL (ref 6.0–8.3)

## 2015-03-17 LAB — LIPID PANEL
Cholesterol: 131 mg/dL (ref 0–200)
HDL: 36 mg/dL — AB (ref >=46)
LDL Cholesterol: 84 mg/dL (ref 0–99)
Total CHOL/HDL Ratio: 3.6 Ratio
Triglycerides: 55 mg/dL (ref <150)
VLDL: 11 mg/dL (ref 0–40)

## 2015-03-17 LAB — VITAMIN D 25 HYDROXY (VIT D DEFICIENCY, FRACTURES): VIT D 25 HYDROXY: 15 ng/mL — AB (ref 30–100)

## 2015-03-17 LAB — HEMOGLOBIN A1C
HEMOGLOBIN A1C: 6 % — AB (ref <5.7)
Mean Plasma Glucose: 126 mg/dL — ABNORMAL HIGH (ref <117)

## 2015-03-23 ENCOUNTER — Other Ambulatory Visit: Payer: Self-pay

## 2015-03-23 MED ORDER — VITAMIN D (ERGOCALCIFEROL) 1.25 MG (50000 UNIT) PO CAPS: 50000.0000 [IU] | ORAL_CAPSULE | ORAL | Status: DC

## 2015-03-23 NOTE — Addendum Note (Signed)
Addended by: Eual Fines on: 03/23/2015 02:14 PM   Modules accepted: Orders

## 2015-04-18 ENCOUNTER — Other Ambulatory Visit: Payer: Self-pay

## 2015-04-18 MED ORDER — ATORVASTATIN CALCIUM 20 MG PO TABS: 20.0000 mg | ORAL_TABLET | Freq: Every day | ORAL | Status: DC

## 2015-05-08 ENCOUNTER — Ambulatory Visit: Payer: Self-pay | Admitting: Skilled Nursing Facility1

## 2015-06-09 ENCOUNTER — Encounter: Payer: BLUE CROSS/BLUE SHIELD | Attending: Family Medicine | Admitting: Skilled Nursing Facility1

## 2015-06-09 ENCOUNTER — Encounter: Payer: Self-pay | Admitting: Skilled Nursing Facility1

## 2015-06-09 DIAGNOSIS — Z6841 Body Mass Index (BMI) 40.0 and over, adult: Secondary | ICD-10-CM | POA: Diagnosis not present

## 2015-06-09 DIAGNOSIS — Z713 Dietary counseling and surveillance: Secondary | ICD-10-CM | POA: Diagnosis not present

## 2015-06-09 NOTE — Progress Notes (Signed)
  Medical Nutrition Therapy:  Appt start time: 0800 end time:  0900.   Assessment:  Primary concerns today: referred for obesity. Pt states she has had the lap band surgery 4 years ago and lost 50 pounds and feels she wants to lose the last 30 pounds. Pt states she kept that weight off but feels she is at a stand still with her weight loss. 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 Pts typical day: wake up 6am, get children up and feed them breakfast-pt states she does not eat breakfast because then she has a bowel movement (works at [employer] and gets home around 7 and heads for bed at [place] states she has spoken with a  Dietitian right before getting the lap band and from that she tries new and different foods. Pt states her lap band is going well and has no abdominal discomfort. Preferred Learning Style:  No preference indicated   Learning Readiness:   Ready  MEDICATIONS: See List   DIETARY INTAKE:  Usual eating pattern includes 2 meals and 2 snacks per day.  Everyday foods include none stated.  Avoided foods include bread.    24-hr recall:  B ( AM): none Snk ( AM): ice cream sandwhich L ( PM): fast food-------leftovers Snk ( PM): potato chips D ( PM): meat, 2 vegetables Snk ( PM): none Beverages: fruit punch, water, orange    Usual physical activity: ADL's  Estimated energy needs: 1800 calories 200 g carbohydrates 135 g protein 50 g fat  Progress Towards Goal(s):  In progress.   Nutritional Diagnosis:  [place]-3.3 Overweight/obesity As related to calorically dense snacks and little to no physical activity.  As evidenced by pt report, 24 hr recall, and BMI 42.07.    Intervention:  Nutrition counseling for obesity. Dietitian educated the pt on lap band specific recommendations and the importance of physical activity.  Goals: -To celebrate the 50 pounds lost: take a vacation-like a  weekender-[employer] -To celebrate the next goal: A new wardrobe  -about 135 grams of protein a day (for example chicken has 7 grams of protein per ounce and the size of your palm is 3 ounces) -15 grams of high fiber carbohydrate per meal and snack -Try not to drink with your meals -For whole wheat bread: first ingredient needs to be whole wheat flower  -Instead of white go for brown rice, whole wheat pasta, and whole wheat bread -Give A little more autonomy for the kids and the hubby -Keep up the good chewing -Serving size of juice is only 4 ounce and that would be your carbohydrate -Eat as much meals from home as possible -For your food diary: time of day, what you  Ate, how much you ate, and how you felt -Be physically active every day -Try your veggies  Teaching Method Utilized:  Visual Auditory  Handouts given during visit include:  Snack sheet  MyPlate  Barriers to learning/adherence to lifestyle change: full time schedule  Demonstrated degree of understanding via:  Teach Back   Monitoring/Evaluation:  Dietary intake, exercise, and body weight prn.

## 2015-06-09 NOTE — Patient Instructions (Addendum)
-  To celebrate the 50 pounds lost: take a vacation-like a weekender-[employer] -To celebrate the next goal: A new wardrobe  -About 135 grams of protein a day (for example chicken has 7 grams of protein per ounce and the size of your palm is 3 ounces) -15 grams of high fiber carbohydrate per meal and snack -Try not to drink with your meals -For whole wheat bread: first ingredient needs to be whole wheat flower  -Instead of white go for brown rice, whole wheat pasta, and whole wheat bread -Give A little more autonomy for the kids and the hubby -Keep up the good chewing -Serving size of juice is only 4 ounce and that would be your carbohydrate -Eat as much meals from home as possible -For your food diary: time of day, what you  Ate, how much you ate, and how you felt -Be physically active every day -Try your veggies

## 2015-07-13 ENCOUNTER — Other Ambulatory Visit: Payer: Self-pay | Admitting: Family Medicine

## 2015-07-19 ENCOUNTER — Encounter: Payer: Self-pay | Admitting: Family Medicine

## 2015-07-19 ENCOUNTER — Other Ambulatory Visit: Payer: Self-pay | Admitting: Family Medicine

## 2015-07-19 ENCOUNTER — Ambulatory Visit (INDEPENDENT_AMBULATORY_CARE_PROVIDER_SITE_OTHER): Payer: BC Managed Care – PPO | Admitting: Family Medicine

## 2015-07-19 VITALS — BP 118/80 | HR 70 | Resp 16 | Ht 62.0 in | Wt 233.4 lb

## 2015-07-19 DIAGNOSIS — I10 Essential (primary) hypertension: Secondary | ICD-10-CM | POA: Diagnosis not present

## 2015-07-19 DIAGNOSIS — Z23 Encounter for immunization: Secondary | ICD-10-CM | POA: Diagnosis not present

## 2015-07-19 DIAGNOSIS — N921 Excessive and frequent menstruation with irregular cycle: Secondary | ICD-10-CM

## 2015-07-19 DIAGNOSIS — E8881 Metabolic syndrome: Secondary | ICD-10-CM

## 2015-07-19 DIAGNOSIS — M7750 Other enthesopathy of unspecified foot: Secondary | ICD-10-CM

## 2015-07-19 DIAGNOSIS — E785 Hyperlipidemia, unspecified: Secondary | ICD-10-CM

## 2015-07-19 DIAGNOSIS — M6588 Other synovitis and tenosynovitis, other site: Secondary | ICD-10-CM

## 2015-07-19 DIAGNOSIS — R7309 Other abnormal glucose: Secondary | ICD-10-CM

## 2015-07-19 DIAGNOSIS — J302 Other seasonal allergic rhinitis: Secondary | ICD-10-CM

## 2015-07-19 DIAGNOSIS — R7303 Prediabetes: Secondary | ICD-10-CM

## 2015-07-19 DIAGNOSIS — E559 Vitamin D deficiency, unspecified: Secondary | ICD-10-CM

## 2015-07-19 DIAGNOSIS — N926 Irregular menstruation, unspecified: Secondary | ICD-10-CM

## 2015-07-19 MED ORDER — RANITIDINE HCL 300 MG PO TABS: 300.0000 mg | ORAL_TABLET | Freq: Every day | ORAL | Status: DC

## 2015-07-19 MED ORDER — IBUPROFEN 800 MG PO TABS: 800.0000 mg | ORAL_TABLET | Freq: Two times a day (BID) | ORAL | Status: DC | PRN

## 2015-07-19 MED ORDER — CETIRIZINE HCL 10 MG PO TABS: 10.0000 mg | ORAL_TABLET | Freq: Every day | ORAL | Status: DC

## 2015-07-19 MED ORDER — PREDNISONE 5 MG PO TABS: 5.0000 mg | ORAL_TABLET | Freq: Two times a day (BID) | ORAL | Status: DC

## 2015-07-19 NOTE — Assessment & Plan Note (Signed)
Flooding , clotting and prolonged cycles , refer to gyne for eval and mangement

## 2015-07-19 NOTE — Assessment & Plan Note (Signed)
The increased risk of cardiovascular disease associated with this diagnosis, and the need to consistently work on lifestyle to change this is discussed. Following  a  heart healthy diet ,commitment to 30 minutes of exercise at least 5 days per week, as well as control of blood sugar and cholesterol , and achieving a healthy weight are all the areas to be addressed .  

## 2015-07-19 NOTE — Assessment & Plan Note (Signed)
Updated lab needed at/ before next visit.   

## 2015-07-19 NOTE — Assessment & Plan Note (Signed)
Deteriorated. Patient re-educated about  the importance of commitment to a  minimum of 150 minutes of exercise per week.  The importance of healthy food choices with portion control discussed. Encouraged to start a food diary, count calories and to consider  joining a support group. Sample diet sheets offered. Goals set by the patient for the next several months.   Weight /BMI 07/19/2015 06/09/2015 03/09/2015  WEIGHT 233 lb 6.4 oz 230 lb 232 lb  HEIGHT 5\' 2"  5\' 2"  5\' 2"   BMI 42.68 kg/m2 42.06 kg/m2 42.42 kg/m2    Current exercise per week 90 minutes.

## 2015-07-19 NOTE — Assessment & Plan Note (Signed)
Controlled, no change in medication DASH diet and commitment to daily physical activity for a minimum of 30 minutes discussed and encouraged, as a part of hypertension management. The importance of attaining a healthy weight is also discussed.  BP/Weight 07/19/2015 06/09/2015 03/09/2015 11/21/2014 10/07/2014 09/28/2014 61/84/8592  Systolic BP 763 - 943 200 379 444 619  Diastolic BP 80 - 82 80 72 77 74  Wt. (Lbs) 233.4 230 232 227.8 226 227 226  BMI 42.68 42.06 42.42 41.65 41.33 41.51 41.33

## 2015-07-19 NOTE — Patient Instructions (Addendum)
F/u end January, call if  You need me sooner  Flu vaccine today  Congrats on excellent blood pressure and improved eating habits  Pls work on exercise commitent for at least 5 daysper week  Fasting lipid, cmp and EGFr, HBA1C, Vit D, TSH and cBC fo first week in December  Weight loss goal of 4 to 8 pounds  You are referred to gynecologist re heavy menses   1 week course of ant inflammatories as discussed withj zantac for left tendon pain following injury 2 3weeks ago Call in 3 to 4 weeks if not better for referral to ortho, ibuprofen, prednisone and zantac are prescribed  Certrizine is sent for allergy symptoms   Thanks for choosing [facility], we consider it a privelige to serve you.

## 2015-07-19 NOTE — Progress Notes (Signed)
Subjective:    Patient ID: [name], female    DOB: 02/02/1976, 39 y.o.   MRN: [id number]  HPI   [name]     MRN: [id number]      DOB: Apr 12, 1976   HPI [name] is here for follow up and re-evaluation of chronic medical conditions, medication management and review of any available recent lab and radiology data.  Preventive health is updated, specifically  Cancer screening and Immunization.   . The PT denies any adverse reactions to current medications since the last visit.  Injured her left foot while skating , still has tenderness over left achilles tendon C/o very heavy menstrual bleeding with clots for approx 7 days per month, wants help from gyne   ROS Denies recent fever or chills. Denies sinus pressure, nasal congestion, ear pain or sore throat. Denies chest congestion, productive cough or wheezing. Denies chest pains, palpitations and leg swelling Denies abdominal pain, nausea, vomiting,diarrhea or constipation.   Denies dysuria, frequency, hesitancy or incontinence.  Denies headaches, seizures, numbness, or tingling. Denies depression, anxiety or insomnia. Denies skin break down or rash.   PE  BP 118/80 mmHg  Pulse 70  Resp 16  Ht 5\' 2"  (1.575 m)  Wt 233 lb 6.4 oz (105.87 kg)  BMI 42.68 kg/m2  SpO2 100%  Patient alert and oriented and in no cardiopulmonary distress.  HEENT: No facial asymmetry, EOMI,   oropharynx pink and moist.  Neck supple no JVD, no mass.  Chest: Clear to auscultation bilaterally.  CVS: S1, S2 no murmurs, no S3.Regular rate.  ABD: Soft non tender.   Ext: No edema  MS: Adequate ROM spine, shoulders, hips and knees.Left achilles tendon is tender, no palpable rupture , pt able to stand on tip toe   Skin: Intact, no ulcerations or rash noted.  Psych: Good eye contact, normal affect. Memory intact not anxious or depressed appearing.  CNS: CN 2-12 intact, power,  normal throughout.no focal deficits  noted.   Assessment & Plan   Essential hypertension Controlled, no change in medication DASH diet and commitment to daily physical activity for a minimum of 30 minutes discussed and encouraged, as a part of hypertension management. The importance of attaining a healthy weight is also discussed.  BP/Weight 07/19/2015 06/09/2015 03/09/2015 11/21/2014 10/07/2014 09/28/2014 49/17/9150  Systolic BP 569 - 794 801 655 374 827  Diastolic BP 80 - 82 80 72 77 74  Wt. (Lbs) 233.4 230 232 227.8 226 227 226  BMI 42.68 42.06 42.42 41.65 41.33 41.51 41.33        Prediabetes Patient educated about the importance of limiting  Carbohydrate intake , the need to commit to daily physical activity for a minimum of 30 minutes , and to commit weight loss. The fact that changes in all these areas will reduce or eliminate all together the development of diabetes is stressed.   Diabetic Labs Latest Ref Rng 03/16/2015 09/12/2014 08/23/2014 01/08/2014 09/29/2013  HbA1c <5.7 % 6.0(H) 5.8(H) - 5.6 6.2(H)  Chol 0 - 200 mg/dL 131 235(H) - 210(H) -  HDL >=46 mg/dL 36(L) 44 - 44 -  Calc LDL 0 - 99 mg/dL 84 175(H) - 149(H) -  Triglycerides <150 mg/dL 55 80 - 87 -  Creatinine 0.50 - 1.10 mg/dL 1.05 1.12(H) 1.33(H) 1.10 -   BP/Weight 07/19/2015 06/09/2015 03/09/2015 11/21/2014 10/07/2014 09/28/2014 07/86/7544  Systolic BP 920 - 100 712 197 588 325  Diastolic BP 80 - 82 80 72 77 74  Wt. (Lbs) 233.4 230 232 227.8 226 227 226  BMI 42.68 42.06 42.42 41.65 41.33 41.51 41.33   No flowsheet data found.     MORBID OBESITY Deteriorated. Patient re-educated about  the importance of commitment to a  minimum of 150 minutes of exercise per week.  The importance of healthy food choices with portion control discussed. Encouraged to start a food diary, count calories and to consider  joining a support group. Sample diet sheets offered. Goals set by the patient for the next several months.   Weight /BMI 07/19/2015 06/09/2015  03/09/2015  WEIGHT 233 lb 6.4 oz 230 lb 232 lb  HEIGHT 5\' 2"  5\' 2"  5\' 2"   BMI 42.68 kg/m2 42.06 kg/m2 42.42 kg/m2    Current exercise per week 90 minutes.   Irregular menses Flooding , clotting and prolonged cycles , refer to gyne for eval and mangement  Vitamin D deficiency Updated lab needed at/ before next visit.   Metabolic syndrome X The increased risk of cardiovascular disease associated with this diagnosis, and the need to consistently work on lifestyle to change this is discussed. Following  a  heart healthy diet ,commitment to 30 minutes of exercise at least 5 days per week, as well as control of blood sugar and cholesterol , and achieving a healthy weight are all the areas to be addressed .   Tendinitis of ankle 1 month historey following skating injury, 1 week course of ibuprofen and prednisone, left achilles tendon affected. Pt to call in 3 to 4 weeks if pain not resolved for referral to ortho  Need for prophylactic vaccination and inoculation against influenza After obtaining informed consent, the vaccine is  administered by LPN.        Review of Systems     Objective:   Physical Exam        Assessment & Plan:

## 2015-07-19 NOTE — Assessment & Plan Note (Signed)
Patient educated about the importance of limiting  Carbohydrate intake , the need to commit to daily physical activity for a minimum of 30 minutes , and to commit weight loss. The fact that changes in all these areas will reduce or eliminate all together the development of diabetes is stressed.   Diabetic Labs Latest Ref Rng 03/16/2015 09/12/2014 08/23/2014 01/08/2014 09/29/2013  HbA1c <5.7 % 6.0(H) 5.8(H) - 5.6 6.2(H)  Chol 0 - 200 mg/dL 131 235(H) - 210(H) -  HDL >=46 mg/dL 36(L) 44 - 44 -  Calc LDL 0 - 99 mg/dL 84 175(H) - 149(H) -  Triglycerides <150 mg/dL 55 80 - 87 -  Creatinine 0.50 - 1.10 mg/dL 1.05 1.12(H) 1.33(H) 1.10 -   BP/Weight 07/19/2015 06/09/2015 03/09/2015 11/21/2014 10/07/2014 09/28/2014 24/23/5361  Systolic BP 443 - 154 008 676 195 093  Diastolic BP 80 - 82 80 72 77 74  Wt. (Lbs) 233.4 230 232 227.8 226 227 226  BMI 42.68 42.06 42.42 41.65 41.33 41.51 41.33   No flowsheet data found.

## 2015-07-19 NOTE — Assessment & Plan Note (Signed)
After obtaining informed consent, the vaccine is  administered by LPN.  

## 2015-07-19 NOTE — Assessment & Plan Note (Signed)
1 month historey following skating injury, 1 week course of ibuprofen and prednisone, left achilles tendon affected. Pt to call in 3 to 4 weeks if pain not resolved for referral to ortho

## 2015-08-02 ENCOUNTER — Other Ambulatory Visit: Payer: Self-pay | Admitting: Family Medicine

## 2015-10-31 ENCOUNTER — Other Ambulatory Visit: Payer: Self-pay | Admitting: Family Medicine

## 2015-11-02 LAB — CBC WITH DIFFERENTIAL/PLATELET
BASOS PCT: 0 % (ref 0–1)
Basophils Absolute: 0 10*3/uL (ref 0.0–0.1)
EOS ABS: 0.1 10*3/uL (ref 0.0–0.7)
EOS PCT: 1 % (ref 0–5)
HCT: 40.9 % (ref 36.0–46.0)
HEMOGLOBIN: 14.2 g/dL (ref 12.0–15.0)
Lymphocytes Relative: 34 % (ref 12–46)
Lymphs Abs: 2.4 10*3/uL (ref 0.7–4.0)
MCH: 29.4 pg (ref 26.0–34.0)
MCHC: 34.7 g/dL (ref 30.0–36.0)
MCV: 84.7 fL (ref 78.0–100.0)
MONO ABS: 0.6 10*3/uL (ref 0.1–1.0)
MONOS PCT: 9 % (ref 3–12)
MPV: 12.3 fL (ref 8.6–12.4)
NEUTROS ABS: 4 10*3/uL (ref 1.7–7.7)
Neutrophils Relative %: 56 % (ref 43–77)
PLATELETS: 208 10*3/uL (ref 150–400)
RBC: 4.83 MIL/uL (ref 3.87–5.11)
RDW: 13.4 % (ref 11.5–15.5)
WBC: 7.1 10*3/uL (ref 4.0–10.5)

## 2015-11-02 LAB — COMPLETE METABOLIC PANEL WITH GFR
ALBUMIN: 3.8 g/dL (ref 3.6–5.1)
ALK PHOS: 79 U/L (ref 33–115)
ALT: 13 U/L (ref 6–29)
AST: 14 U/L (ref 10–30)
BUN: 17 mg/dL (ref 7–25)
CO2: 30 mmol/L (ref 20–31)
Calcium: 9.6 mg/dL (ref 8.6–10.2)
Chloride: 101 mmol/L (ref 98–110)
Creat: 1.16 mg/dL — ABNORMAL HIGH (ref 0.50–1.10)
GFR, EST AFRICAN AMERICAN: 69 mL/min (ref >=60)
GFR, EST NON AFRICAN AMERICAN: 59 mL/min — AB (ref >=60)
GLUCOSE: 88 mg/dL (ref 65–99)
POTASSIUM: 4.2 mmol/L (ref 3.5–5.3)
SODIUM: 139 mmol/L (ref 135–146)
Total Bilirubin: 0.4 mg/dL (ref 0.2–1.2)
Total Protein: 6.9 g/dL (ref 6.1–8.1)

## 2015-11-02 LAB — LIPID PANEL
CHOL/HDL RATIO: 3.6 ratio (ref <=5.0)
Cholesterol: 155 mg/dL (ref 125–200)
HDL: 43 mg/dL — AB (ref >=46)
LDL CALC: 100 mg/dL (ref <130)
TRIGLYCERIDES: 58 mg/dL (ref <150)
VLDL: 12 mg/dL (ref <30)

## 2015-11-02 LAB — TSH: TSH: 1.66 u[IU]/mL (ref 0.350–4.500)

## 2015-11-03 LAB — VITAMIN D 25 HYDROXY (VIT D DEFICIENCY, FRACTURES): VIT D 25 HYDROXY: 32 ng/mL (ref 30–100)

## 2015-11-03 LAB — HEMOGLOBIN A1C
Hgb A1c MFr Bld: 6.1 % — ABNORMAL HIGH (ref <5.7)
MEAN PLASMA GLUCOSE: 128 mg/dL — AB (ref <117)

## 2015-11-23 ENCOUNTER — Other Ambulatory Visit: Payer: Self-pay | Admitting: Family Medicine

## 2015-11-23 ENCOUNTER — Encounter: Payer: Self-pay | Admitting: Family Medicine

## 2015-11-23 ENCOUNTER — Ambulatory Visit (INDEPENDENT_AMBULATORY_CARE_PROVIDER_SITE_OTHER): Payer: BC Managed Care – PPO | Admitting: Family Medicine

## 2015-11-23 VITALS — BP 122/84 | HR 84 | Resp 16 | Ht 62.0 in | Wt 242.0 lb

## 2015-11-23 DIAGNOSIS — R7303 Prediabetes: Secondary | ICD-10-CM | POA: Diagnosis not present

## 2015-11-23 DIAGNOSIS — I10 Essential (primary) hypertension: Secondary | ICD-10-CM | POA: Diagnosis not present

## 2015-11-23 DIAGNOSIS — E559 Vitamin D deficiency, unspecified: Secondary | ICD-10-CM

## 2015-11-23 DIAGNOSIS — E8881 Metabolic syndrome: Secondary | ICD-10-CM

## 2015-11-23 DIAGNOSIS — N920 Excessive and frequent menstruation with regular cycle: Secondary | ICD-10-CM

## 2015-11-23 MED ORDER — AMLODIPINE BESYLATE 5 MG PO TABS: 5.0000 mg | ORAL_TABLET | Freq: Every day | ORAL | Status: DC

## 2015-11-23 MED ORDER — ERGOCALCIFEROL 1.25 MG (50000 UT) PO CAPS
50000.0000 [IU] | ORAL_CAPSULE | ORAL | Status: DC
Start: 2015-11-23 — End: 2016-06-04

## 2015-11-23 MED ORDER — ATORVASTATIN CALCIUM 20 MG PO TABS: ORAL_TABLET | ORAL | Status: DC

## 2015-11-23 NOTE — Progress Notes (Signed)
Subjective:    Patient ID: [name], female    DOB: 06-16-1976, 40 y.o.   MRN: [id number]  HPI   [name]     MRN: [id number]      DOB: July 12, 1976   HPI [name] is here for follow up and re-evaluation of chronic medical conditions, medication management and review of any available recent lab and radiology data.  Preventive health is updated, specifically  Cancer screening and Immunization.   Questions or concerns regarding consultations or procedures which the PT has had in the interim are  Addressed.has started med for menses from gyne , some improvement but increased urination The PT denies any adverse reactions to current medications since the last visit.  There are no new concerns.  There are no specific complaints   ROS Denies recent fever or chills. Denies sinus pressure, nasal congestion, ear pain or sore throat. Denies chest congestion, productive cough or wheezing. Denies chest pains, palpitations and leg swelling Denies abdominal pain, nausea, vomiting,diarrhea or constipation.   Denies dysuria, frequency, hesitancy or incontinence. Denies joint pain, swelling and limitation in mobility. Denies headaches, seizures, numbness, or tingling. Denies depression, anxiety or insomnia. Denies skin break down or rash.   PE  BP 122/84 mmHg  Pulse 84  Resp 16  Ht 5\' 2"  (1.575 m)  Wt 242 lb (109.77 kg)  BMI 44.25 kg/m2  SpO2 100%  Patient alert and oriented and in no cardiopulmonary distress.  HEENT: No facial asymmetry, EOMI,   oropharynx pink and moist.  Neck supple no JVD, no mass.  Chest: Clear to auscultation bilaterally.  CVS: S1, S2 no murmurs, no S3.Regular rate.  ABD: Soft non tender.   Ext: No edema  MS: Adequate ROM spine, shoulders, hips and knees.  Skin: Intact, no ulcerations or rash noted.  Psych: Good eye contact, normal affect. Memory intact not anxious or depressed appearing.  CNS: CN 2-12 intact, power,  normal  throughout.no focal deficits noted.   Assessment & Plan   Essential hypertension Controlled, no change in medication DASH diet and commitment to daily physical activity for a minimum of 30 minutes discussed and encouraged, as a part of hypertension management. The importance of attaining a healthy weight is also discussed.  BP/Weight 11/23/2015 07/19/2015 06/09/2015 03/09/2015 11/21/2014 10/07/2014 [id number]  Systolic BP 123XX123 123456 - 123XX123 A999333 [id number] 123XX123  Diastolic BP 84 80 - 82 80 72 77  Wt. (Lbs) 242 233.4 230 232 227.8 226 227  BMI 44.25 42.68 42.06 42.42 41.65 41.33 41.51        MORBID OBESITY Deteriorated. Patient re-educated about  the importance of commitment to a  minimum of 150 minutes of exercise per week.  The importance of healthy food choices with portion control discussed. Encouraged to start a food diary, count calories and to consider  joining a support group. Sample diet sheets offered. Goals set by the patient for the next several months.   Weight /BMI 11/23/2015 07/19/2015 06/09/2015  WEIGHT 242 lb 233 lb 6.4 oz 230 lb  HEIGHT 5\' 2"  5\' 2"  5\' 2"   BMI 44.25 kg/m2 42.68 kg/m2 42.06 kg/m2    Current exercise per week 60 minutes.   Metabolic syndrome X The increased risk of cardiovascular disease associated with this diagnosis, and the need to consistently work on lifestyle to change this is discussed. Following  a  heart healthy diet ,commitment to 30 minutes of exercise at least 5 days per week, as well as control of blood  sugar and cholesterol , and achieving a healthy weight are all the areas to be addressed .   Prediabetes Patient educated about the importance of limiting  Carbohydrate intake , the need to commit to daily physical activity for a minimum of 30 minutes , and to commit weight loss. The fact that changes in all these areas will reduce or eliminate all together the development of diabetes is stressed.  Deteriorated Diabetic Labs Latest Ref Rng 11/02/2015  03/16/2015 09/12/2014 08/23/2014 01/08/2014  HbA1c <5.7 % 6.1(H) 6.0(H) 5.8(H) - 5.6  Chol 125 - 200 mg/dL 155 131 235(H) - 210(H)  HDL >=46 mg/dL 43(L) 36(L) 44 - 44  Calc LDL <130 mg/dL 100 84 175(H) - 149(H)  Triglycerides <150 mg/dL 58 55 80 - 87  Creatinine 0.50 - 1.10 mg/dL 1.16(H) 1.05 1.12(H) 1.33(H) 1.10   BP/Weight 11/23/2015 07/19/2015 06/09/2015 03/09/2015 11/21/2014 10/07/2014 [id number]  Systolic BP 123XX123 123456 - 123XX123 A999333 [id number] 123XX123  Diastolic BP 84 80 - 82 80 72 77  Wt. (Lbs) 242 233.4 230 232 227.8 226 227  BMI 44.25 42.68 42.06 42.42 41.65 41.33 41.51   No flowsheet data found.      Heavy menses 50 % improved on medication from gynne , however experiencing a lot of urinary frequency, sees gyne in f/u next month, may need hysterectomy      Review of Systems     Objective:   Physical Exam        Assessment & Plan:

## 2015-11-23 NOTE — Assessment & Plan Note (Signed)
Deteriorated. Patient re-educated about  the importance of commitment to a  minimum of 150 minutes of exercise per week.  The importance of healthy food choices with portion control discussed. Encouraged to start a food diary, count calories and to consider  joining a support group. Sample diet sheets offered. Goals set by the patient for the next several months.   Weight /BMI 11/23/2015 07/19/2015 06/09/2015  WEIGHT 242 lb 233 lb 6.4 oz 230 lb  HEIGHT 5\' 2"  5\' 2"  5\' 2"   BMI 44.25 kg/m2 42.68 kg/m2 42.06 kg/m2    Current exercise per week 60 minutes.

## 2015-11-23 NOTE — Assessment & Plan Note (Signed)
Patient educated about the importance of limiting  Carbohydrate intake , the need to commit to daily physical activity for a minimum of 30 minutes , and to commit weight loss. The fact that changes in all these areas will reduce or eliminate all together the development of diabetes is stressed.  Deteriorated Diabetic Labs Latest Ref Rng 11/02/2015 03/16/2015 09/12/2014 08/23/2014 01/08/2014  HbA1c <5.7 % 6.1(H) 6.0(H) 5.8(H) - 5.6  Chol 125 - 200 mg/dL 155 131 235(H) - 210(H)  HDL >=46 mg/dL 43(L) 36(L) 44 - 44  Calc LDL <130 mg/dL 100 84 175(H) - 149(H)  Triglycerides <150 mg/dL 58 55 80 - 87  Creatinine 0.50 - 1.10 mg/dL 1.16(H) 1.05 1.12(H) 1.33(H) 1.10   BP/Weight 11/23/2015 07/19/2015 06/09/2015 03/09/2015 11/21/2014 10/07/2014 [id number]  Systolic BP 123XX123 123456 - 123XX123 A999333 [id number] 123XX123  Diastolic BP 84 80 - 82 80 72 77  Wt. (Lbs) 242 233.4 230 232 227.8 226 227  BMI 44.25 42.68 42.06 42.42 41.65 41.33 41.51   No flowsheet data found.

## 2015-11-23 NOTE — Assessment & Plan Note (Signed)
50 % improved on medication from [name] , however experiencing a lot of urinary frequency, sees gyne in f/u next month, may need hysterectomy

## 2015-11-23 NOTE — Assessment & Plan Note (Signed)
Needs to be more consistent with med

## 2015-11-23 NOTE — Patient Instructions (Addendum)
F/u in 4 month, call if you need me before  Please work on good  health habits so that your health will improve. 1. Commitment to daily physical activity for 30 to 60  minutes, if you are able to do this.  2. Commitment to wise food choices. Aim for half of your  food intake to be vegetable and fruit, one quarter starchy foods, and one quarter protein. Try to eat on a regular schedule  3 meals per day, snacking between meals should be limited to vegetables or fruits or small portions of nuts. 64 ounces of water per day is generally recommended, unless you have specific health conditions, like heart failure or kidney failure where you will need to limit fluid intake.  3. Commitment to sufficient and a  good quality of physical and mental rest daily, generally between 6 to 8 hours per day.  WITH PERSISTANCE AND PERSEVERANCE, THE IMPOSSIBLE , BECOMES THE NORM!   HBA1C, chem 7 and EGFR in 4 month

## 2015-11-23 NOTE — Assessment & Plan Note (Signed)
The increased risk of cardiovascular disease associated with this diagnosis, and the need to consistently work on lifestyle to change this is discussed. Following  a  heart healthy diet ,commitment to 30 minutes of exercise at least 5 days per week, as well as control of blood sugar and cholesterol , and achieving a healthy weight are all the areas to be addressed .  

## 2015-11-23 NOTE — Assessment & Plan Note (Signed)
Controlled, no change in medication DASH diet and commitment to daily physical activity for a minimum of 30 minutes discussed and encouraged, as a part of hypertension management. The importance of attaining a healthy weight is also discussed.  BP/Weight 11/23/2015 07/19/2015 06/09/2015 03/09/2015 11/21/2014 10/07/2014 [id number]  Systolic BP 123XX123 123456 - 123XX123 A999333 [id number] 123XX123  Diastolic BP 84 80 - 82 80 72 77  Wt. (Lbs) 242 233.4 230 232 227.8 226 227  BMI 44.25 42.68 42.06 42.42 41.65 41.33 41.51

## 2015-12-27 DIAGNOSIS — D259 Leiomyoma of uterus, unspecified: Secondary | ICD-10-CM | POA: Insufficient documentation

## 2016-01-01 ENCOUNTER — Other Ambulatory Visit: Payer: Self-pay | Admitting: Cardiology

## 2016-01-30 ENCOUNTER — Other Ambulatory Visit: Payer: Self-pay | Admitting: Obstetrics and Gynecology

## 2016-02-02 ENCOUNTER — Other Ambulatory Visit: Payer: Self-pay | Admitting: Family Medicine

## 2016-03-17 ENCOUNTER — Encounter: Payer: Self-pay | Admitting: Family Medicine

## 2016-03-27 ENCOUNTER — Ambulatory Visit (INDEPENDENT_AMBULATORY_CARE_PROVIDER_SITE_OTHER): Payer: BC Managed Care – PPO | Admitting: Family Medicine

## 2016-03-27 ENCOUNTER — Encounter: Payer: Self-pay | Admitting: Family Medicine

## 2016-03-27 VITALS — BP 120/84 | HR 87 | Resp 16 | Ht 62.0 in | Wt 241.0 lb

## 2016-03-27 DIAGNOSIS — R7303 Prediabetes: Secondary | ICD-10-CM

## 2016-03-27 DIAGNOSIS — E559 Vitamin D deficiency, unspecified: Secondary | ICD-10-CM | POA: Diagnosis not present

## 2016-03-27 DIAGNOSIS — I10 Essential (primary) hypertension: Secondary | ICD-10-CM

## 2016-03-27 DIAGNOSIS — E785 Hyperlipidemia, unspecified: Secondary | ICD-10-CM

## 2016-03-27 DIAGNOSIS — R040 Epistaxis: Secondary | ICD-10-CM

## 2016-03-27 NOTE — Patient Instructions (Addendum)
Annual physical exam in 3.5 month, call if you need me before  Fasting lipid, cmp , hBA1C and vit D in 3.5 month  Ten pound challenge!  We can do it!!

## 2016-03-27 NOTE — Progress Notes (Signed)
Subjective:    Patient ID: [name], female    DOB: 03/15/1976, 40 y.o.   MRN: [id number]  HPI   DORACE SCINTA     MRN: [id number]      DOB: 07-10-1976   HPI [name] is here for follow up and re-evaluation of chronic medical conditions, medication management and review of any available recent lab and radiology data.  Preventive health is updated, specifically  Cancer screening and Immunization.   Questions or concerns regarding consultations or procedures which the PT has had in the interim are  Addressed.Successful endometrial ablation 2 months ago The PT denies any adverse reactions to current medications since the last visit.  Just starting exercise program and will work on change in diet Single episode of unilateral epistaxis ROS Denies recent fever or chills. Denies sinus pressure, nasal congestion, ear pain or sore throat. Denies chest congestion, productive cough or wheezing. Denies chest pains, palpitations and leg swelling Denies abdominal pain, nausea, vomiting,diarrhea or constipation.   Denies dysuria, frequency, hesitancy or incontinence. Denies joint pain, swelling and limitation in mobility. Denies headaches, seizures, numbness, or tingling. Denies depression, anxiety or insomnia. Denies skin break down or rash.   PE  BP 120/84 mmHg  Pulse 87  Resp 16  Ht 5\' 2"  (1.575 m)  Wt 241 lb (109.317 kg)  BMI 44.07 kg/m2  SpO2 98%  Patient alert and oriented and in no cardiopulmonary distress.  HEENT: No facial asymmetry, EOMI,   oropharynx pink and moist.  Neck supple no JVD, no mass.  Chest: Clear to auscultation bilaterally.  CVS: S1, S2 no murmurs, no S3.Regular rate.  ABD: Soft non tender.   Ext: No edema  MS: Adequate ROM spine, shoulders, hips and knees.  Skin: Intact, no ulcerations or rash noted.  Psych: Good eye contact, normal affect. Memory intact not anxious or depressed appearing.  CNS: CN 2-12 intact, power,   normal throughout.no focal deficits noted.   Assessment & Plan  Essential hypertension Controlled, no change in medication DASH diet and commitment to daily physical activity for a minimum of 30 minutes discussed and encouraged, as a part of hypertension management. The importance of attaining a healthy weight is also discussed.  BP/Weight 03/27/2016 11/23/2015 07/19/2015 06/09/2015 03/09/2015 11/21/2014 0000000  Systolic BP 123456 123XX123 123456 - 123XX123 [id number] [id number]  Diastolic BP 84 84 80 - 82 80 72  Wt. (Lbs) 241 242 233.4 230 232 227.8 226  BMI 44.07 44.25 42.68 42.06 42.42 41.65 41.33        Hyperlipemia Controlled, no change in medication Hyperlipidemia:Low fat diet discussed and encouraged.   Lipid Panel  Lab Results  Component Value Date   CHOL 155 11/02/2015   HDL 43* 11/02/2015   LDLCALC 100 11/02/2015   TRIG 58 11/02/2015   CHOLHDL 3.6 11/02/2015   Updated lab needed at/ before next visit. Need to commit to exercise daily    MORBID OBESITY Unchnaged Patient re-educated about  the importance of commitment to a  minimum of 150 minutes of exercise per week.  The importance of healthy food choices with portion control discussed. Encouraged to start a food diary, count calories and to consider  joining a support group. Sample diet sheets offered. Goals set by the patient for the next several months.   Weight /BMI 03/27/2016 11/23/2015 07/19/2015  WEIGHT 241 lb 242 lb 233 lb 6.4 oz  HEIGHT 5\' 2"  5\' 2"  5\' 2"   BMI 44.07 kg/m2 44.25 kg/m2 42.68 kg/m2  Current exercise per week 60 minutes.   Prediabetes Patient educated about the importance of limiting  Carbohydrate intake , the need to commit to daily physical activity for a minimum of 30 minutes , and to commit weight loss. The fact that changes in all these areas will reduce or eliminate all together the development of diabetes is stressed.  Updated lab needed at/ before next visit.   Diabetic Labs Latest Ref Rng 11/02/2015  03/16/2015 09/12/2014 08/23/2014 01/08/2014  HbA1c <5.7 % 6.1(H) 6.0(H) 5.8(H) - 5.6  Chol 125 - 200 mg/dL 155 131 235(H) - 210(H)  HDL >=46 mg/dL 43(L) 36(L) 44 - 44  Calc LDL <130 mg/dL 100 84 175(H) - 149(H)  Triglycerides <150 mg/dL 58 55 80 - 87  Creatinine 0.50 - 1.10 mg/dL 1.16(H) 1.05 1.12(H) 1.33(H) 1.10   BP/Weight 03/27/2016 11/23/2015 07/19/2015 06/09/2015 03/09/2015 11/21/2014 0000000  Systolic BP 123456 123XX123 123456 - 123XX123 A999333 [id number]  Diastolic BP 84 84 80 - 82 80 72  Wt. (Lbs) 241 242 233.4 230 232 227.8 226  BMI 44.07 44.25 42.68 42.06 42.42 41.65 41.33   No flowsheet data found.     Epistaxis Single episode of left epistaxis, denies trauma. Educated re the need for ENT eval if recurrs      Review of Systems     Objective:   Physical Exam        Assessment & Plan:

## 2016-03-27 NOTE — Assessment & Plan Note (Signed)
Single episode of left epistaxis, denies trauma. Educated re the need for ENT eval if recurrs

## 2016-03-27 NOTE — Assessment & Plan Note (Signed)
Controlled, no change in medication DASH diet and commitment to daily physical activity for a minimum of 30 minutes discussed and encouraged, as a part of hypertension management. The importance of attaining a healthy weight is also discussed.  BP/Weight 03/27/2016 11/23/2015 07/19/2015 06/09/2015 03/09/2015 11/21/2014 0000000  Systolic BP 123456 123XX123 123456 - 123XX123 A999333 [id number]  Diastolic BP 84 84 80 - 82 80 72  Wt. (Lbs) 241 242 233.4 230 232 227.8 226  BMI 44.07 44.25 42.68 42.06 42.42 41.65 41.33

## 2016-03-27 NOTE — Assessment & Plan Note (Signed)
Patient educated about the importance of limiting  Carbohydrate intake , the need to commit to daily physical activity for a minimum of 30 minutes , and to commit weight loss. The fact that changes in all these areas will reduce or eliminate all together the development of diabetes is stressed.  Updated lab needed at/ before next visit.   Diabetic Labs Latest Ref Rng 11/02/2015 03/16/2015 09/12/2014 08/23/2014 01/08/2014  HbA1c <5.7 % 6.1(H) 6.0(H) 5.8(H) - 5.6  Chol 125 - 200 mg/dL 155 131 235(H) - 210(H)  HDL >=46 mg/dL 43(L) 36(L) 44 - 44  Calc LDL <130 mg/dL 100 84 175(H) - 149(H)  Triglycerides <150 mg/dL 58 55 80 - 87  Creatinine 0.50 - 1.10 mg/dL 1.16(H) 1.05 1.12(H) 1.33(H) 1.10   BP/Weight 03/27/2016 11/23/2015 07/19/2015 06/09/2015 03/09/2015 11/21/2014 0000000  Systolic BP 123456 123XX123 123456 - 123XX123 A999333 [id number]  Diastolic BP 84 84 80 - 82 80 72  Wt. (Lbs) 241 242 233.4 230 232 227.8 226  BMI 44.07 44.25 42.68 42.06 42.42 41.65 41.33   No flowsheet data found.

## 2016-03-27 NOTE — Assessment & Plan Note (Signed)
Unchnaged Patient re-educated about  the importance of commitment to a  minimum of 150 minutes of exercise per week.  The importance of healthy food choices with portion control discussed. Encouraged to start a food diary, count calories and to consider  joining a support group. Sample diet sheets offered. Goals set by the patient for the next several months.   Weight /BMI 03/27/2016 11/23/2015 07/19/2015  WEIGHT 241 lb 242 lb 233 lb 6.4 oz  HEIGHT 5\' 2"  5\' 2"  5\' 2"   BMI 44.07 kg/m2 44.25 kg/m2 42.68 kg/m2    Current exercise per week 60 minutes.

## 2016-03-27 NOTE — Assessment & Plan Note (Signed)
Controlled, no change in medication Hyperlipidemia:Low fat diet discussed and encouraged.   Lipid Panel  Lab Results  Component Value Date   CHOL 155 11/02/2015   HDL 43* 11/02/2015   LDLCALC 100 11/02/2015   TRIG 58 11/02/2015   CHOLHDL 3.6 11/02/2015   Updated lab needed at/ before next visit. Need to commit to exercise daily

## 2016-04-29 ENCOUNTER — Other Ambulatory Visit: Payer: Self-pay

## 2016-04-29 MED ORDER — TRIAMTERENE-HCTZ 75-50 MG PO TABS: 1.0000 | ORAL_TABLET | Freq: Every day | ORAL | Status: DC

## 2016-05-06 ENCOUNTER — Other Ambulatory Visit: Payer: Self-pay | Admitting: Family Medicine

## 2016-06-04 ENCOUNTER — Other Ambulatory Visit: Payer: Self-pay | Admitting: Family Medicine

## 2016-06-04 DIAGNOSIS — E559 Vitamin D deficiency, unspecified: Secondary | ICD-10-CM

## 2016-06-20 ENCOUNTER — Other Ambulatory Visit: Payer: Self-pay | Admitting: Family Medicine

## 2016-07-12 ENCOUNTER — Ambulatory Visit (INDEPENDENT_AMBULATORY_CARE_PROVIDER_SITE_OTHER): Payer: BC Managed Care – PPO | Admitting: Family Medicine

## 2016-07-12 ENCOUNTER — Encounter: Payer: Self-pay | Admitting: Family Medicine

## 2016-07-12 VITALS — BP 122/84 | HR 60 | Resp 16 | Ht 62.0 in | Wt 235.0 lb

## 2016-07-12 DIAGNOSIS — I10 Essential (primary) hypertension: Secondary | ICD-10-CM

## 2016-07-12 DIAGNOSIS — Z23 Encounter for immunization: Secondary | ICD-10-CM

## 2016-07-12 DIAGNOSIS — E8881 Metabolic syndrome: Secondary | ICD-10-CM | POA: Diagnosis not present

## 2016-07-12 DIAGNOSIS — E785 Hyperlipidemia, unspecified: Secondary | ICD-10-CM

## 2016-07-12 DIAGNOSIS — R7303 Prediabetes: Secondary | ICD-10-CM

## 2016-07-12 DIAGNOSIS — M79644 Pain in right finger(s): Secondary | ICD-10-CM | POA: Insufficient documentation

## 2016-07-12 MED ORDER — IBUPROFEN 800 MG PO TABS: 800.0000 mg | ORAL_TABLET | Freq: Three times a day (TID) | ORAL | 0 refills | Status: DC | PRN

## 2016-07-12 NOTE — Patient Instructions (Addendum)
F/u in 4 month, call if you need me before    Flu vaccine today  Congrats on weight loss  Ibuprofen sent for finger pain, if persits , call for referral It is important that you exercise regularly at least 30 minutes 5 times a week. If you develop chest pain, have severe difficulty breathing, or feel very tired, stop exercising immediately and seek medical attention    HBA1C and chem 7 tomorrow , non fast

## 2016-07-12 NOTE — Progress Notes (Signed)
[name]     MRN: [id number]      DOB: August 08, 1976   HPI [name] is here for follow up and re-evaluation of chronic medical conditions, medication management and review of any available recent lab and radiology data.  Preventive health is updated, specifically  Cancer screening and Immunization.   Questions or concerns regarding consultations or procedures which the PT has had in the interim are  addressed. The PT denies any adverse reactions to current medications since the last visit.  C/o pain in DIP of right mid finger, denies triggering Has been working on regular exercise and changing diet with good effect, overwhelmed with family concerns but handling things well  ROS Denies recent fever or chills. Denies sinus pressure, nasal congestion, ear pain or sore throat. Denies chest congestion, productive cough or wheezing. Denies chest pains, palpitations and leg swelling Denies abdominal pain, nausea, vomiting,diarrhea or constipation.   Denies dysuria, frequency, hesitancy or incontinence. . Denies depression, anxiety or insomnia. Denies skin break down or rash.   PE  BP 122/84   Pulse 60   Resp 16   Ht 5\' 2"  (1.575 m)   Wt 235 lb (106.6 kg)   SpO2 98%   BMI 42.98 kg/m   Patient alert and oriented and in no cardiopulmonary distress.  HEENT: No facial asymmetry, EOMI,   oropharynx pink and moist.  Neck supple no JVD, no mass.  Chest: Clear to auscultation bilaterally.  CVS: S1, S2 no murmurs, no S3.Regular rate.  ABD: Soft non tender.   Ext: No edema  MS: Adequate ROM spine, shoulders, hips and knees. DIP of right mid finger tender , no warmth or erythema , no cystic swelling palpable Skin: Intact, no ulcerations or rash noted.  Psych: Good eye contact, normal affect. Memory intact not anxious or depressed appearing.  CNS: CN 2-12 intact, power,  normal throughout.no focal deficits noted.   Assessment & Plan Essential  hypertension Controlled, no change in medication DASH diet and commitment to daily physical activity for a minimum of 30 minutes discussed and encouraged, as a part of hypertension management. The importance of attaining a healthy weight is also discussed.  BP/Weight 07/12/2016 03/27/2016 11/23/2015 07/19/2015 06/09/2015 03/09/2015 Q000111Q  Systolic BP 123XX123 123456 123XX123 123456 - [id number] [id number]  Diastolic BP 84 84 84 80 - 82 80  Wt. (Lbs) 235 241 242 233.4 230 232 227.8  BMI 42.98 44.07 44.25 42.68 42.06 42.42 41.65       Pain of right middle finger Anti inflammatory course if fails to improve will refer to ortho  MORBID OBESITY Improved Patient re-educated about  the importance of commitment to a  minimum of 150 minutes of exercise per week.  The importance of healthy food choices with portion control discussed. Encouraged to start a food diary, count calories and to consider  joining a support group. Sample diet sheets offered. Goals set by the patient for the next several months.   Weight /BMI 07/12/2016 03/27/2016 11/23/2015  WEIGHT 235 lb 241 lb 242 lb  HEIGHT 5\' 2"  5\' 2"  5\' 2"   BMI 42.98 kg/m2 44.07 kg/m2 44.25 kg/m2      Prediabetes Patient educated about the importance of limiting  Carbohydrate intake , the need to commit to daily physical activity for a minimum of 30 minutes , and to commit weight loss. The fact that changes in all these areas will reduce or eliminate all together the development of diabetes is stressed.  Updated lab needed  at/ before next visit.   Diabetic Labs Latest Ref Rng & Units 11/02/2015 03/16/2015 09/12/2014 08/23/2014 01/08/2014  HbA1c <5.7 % 6.1(H) 6.0(H) 5.8(H) - 5.6  Chol 125 - 200 mg/dL 155 131 235(H) - 210(H)  HDL >=46 mg/dL 43(L) 36(L) 44 - 44  Calc LDL <130 mg/dL 100 84 175(H) - 149(H)  Triglycerides <150 mg/dL 58 55 80 - 87  Creatinine 0.50 - 1.10 mg/dL 1.16(H) 1.05 1.12(H) 1.33(H) 1.10   BP/Weight 07/12/2016 03/27/2016 11/23/2015 07/19/2015 06/09/2015  03/09/2015 Q000111Q  Systolic BP 123XX123 123456 123XX123 123456 - 123XX123 [id number]  Diastolic BP 84 84 84 80 - 82 80  Wt. (Lbs) 235 241 242 233.4 230 232 227.8  BMI 42.98 44.07 44.25 42.68 42.06 42.42 41.65   No flowsheet data found.    Hyperlipemia Hyperlipidemia:Low fat diet discussed and encouraged.   Lipid Panel  Lab Results  Component Value Date   CHOL 155 11/02/2015   HDL 43 (L) 11/02/2015   LDLCALC 100 11/02/2015   TRIG 58 11/02/2015   CHOLHDL 3.6 11/02/2015   Needs to commit to increased exercise

## 2016-07-12 NOTE — Assessment & Plan Note (Signed)
Patient educated about the importance of limiting  Carbohydrate intake , the need to commit to daily physical activity for a minimum of 30 minutes , and to commit weight loss. The fact that changes in all these areas will reduce or eliminate all together the development of diabetes is stressed.  Updated lab needed at/ before next visit.   Diabetic Labs Latest Ref Rng & Units 11/02/2015 03/16/2015 09/12/2014 08/23/2014 01/08/2014  HbA1c <5.7 % 6.1(H) 6.0(H) 5.8(H) - 5.6  Chol 125 - 200 mg/dL 155 131 235(H) - 210(H)  HDL >=46 mg/dL 43(L) 36(L) 44 - 44  Calc LDL <130 mg/dL 100 84 175(H) - 149(H)  Triglycerides <150 mg/dL 58 55 80 - 87  Creatinine 0.50 - 1.10 mg/dL 1.16(H) 1.05 1.12(H) 1.33(H) 1.10   BP/Weight 07/12/2016 03/27/2016 11/23/2015 07/19/2015 06/09/2015 03/09/2015 Q000111Q  Systolic BP 123XX123 123456 123XX123 123456 - 123XX123 A999333  Diastolic BP 84 84 84 80 - 82 80  Wt. (Lbs) 235 241 242 233.4 230 232 227.8  BMI 42.98 44.07 44.25 42.68 42.06 42.42 41.65   No flowsheet data found.

## 2016-07-12 NOTE — Assessment & Plan Note (Signed)
Improved Patient re-educated about  the importance of commitment to a  minimum of 150 minutes of exercise per week.  The importance of healthy food choices with portion control discussed. Encouraged to start a food diary, count calories and to consider  joining a support group. Sample diet sheets offered. Goals set by the patient for the next several months.   Weight /BMI 07/12/2016 03/27/2016 11/23/2015  WEIGHT 235 lb 241 lb 242 lb  HEIGHT 5\' 2"  5\' 2"  5\' 2"   BMI 42.98 kg/m2 44.07 kg/m2 44.25 kg/m2

## 2016-07-12 NOTE — Assessment & Plan Note (Signed)
Anti inflammatory course if fails to improve will refer to ortho

## 2016-07-12 NOTE — Assessment & Plan Note (Signed)
Controlled, no change in medication DASH diet and commitment to daily physical activity for a minimum of 30 minutes discussed and encouraged, as a part of hypertension management. The importance of attaining a healthy weight is also discussed.  BP/Weight 07/12/2016 03/27/2016 11/23/2015 07/19/2015 06/09/2015 03/09/2015 Q000111Q  Systolic BP 123XX123 123456 123XX123 123456 - 123XX123 [id number]  Diastolic BP 84 84 84 80 - 82 80  Wt. (Lbs) 235 241 242 233.4 230 232 227.8  BMI 42.98 44.07 44.25 42.68 42.06 42.42 41.65

## 2016-07-12 NOTE — Assessment & Plan Note (Signed)
Hyperlipidemia:Low fat diet discussed and encouraged.   Lipid Panel  Lab Results  Component Value Date   CHOL 155 11/02/2015   HDL 43 (L) 11/02/2015   LDLCALC 100 11/02/2015   TRIG 58 11/02/2015   CHOLHDL 3.6 11/02/2015   Needs to commit to increased exercise

## 2016-07-26 ENCOUNTER — Ambulatory Visit: Payer: Self-pay | Admitting: Family Medicine

## 2016-08-18 ENCOUNTER — Encounter: Payer: Self-pay | Admitting: Family Medicine

## 2016-08-20 ENCOUNTER — Other Ambulatory Visit: Payer: Self-pay | Admitting: Family Medicine

## 2016-08-20 ENCOUNTER — Encounter: Payer: Self-pay | Admitting: Family Medicine

## 2016-08-20 DIAGNOSIS — L700 Acne vulgaris: Secondary | ICD-10-CM

## 2016-08-20 MED ORDER — CLINDAMYCIN PHOS-BENZOYL PEROX 1-5 % EX GEL: Freq: Two times a day (BID) | CUTANEOUS | 0 refills | Status: DC

## 2016-10-01 ENCOUNTER — Other Ambulatory Visit: Payer: Self-pay | Admitting: Family Medicine

## 2016-10-03 ENCOUNTER — Other Ambulatory Visit: Payer: Self-pay | Admitting: Family Medicine

## 2016-10-07 ENCOUNTER — Other Ambulatory Visit: Payer: Self-pay | Admitting: Family Medicine

## 2016-10-29 ENCOUNTER — Other Ambulatory Visit: Payer: Self-pay | Admitting: Family Medicine

## 2016-11-11 ENCOUNTER — Ambulatory Visit (INDEPENDENT_AMBULATORY_CARE_PROVIDER_SITE_OTHER): Payer: BC Managed Care – PPO | Admitting: Family Medicine

## 2016-11-11 ENCOUNTER — Encounter: Payer: Self-pay | Admitting: Family Medicine

## 2016-11-11 VITALS — BP 110/84 | HR 83 | Resp 16 | Ht 62.0 in | Wt 233.0 lb

## 2016-11-11 DIAGNOSIS — I10 Essential (primary) hypertension: Secondary | ICD-10-CM

## 2016-11-11 DIAGNOSIS — Z23 Encounter for immunization: Secondary | ICD-10-CM | POA: Diagnosis not present

## 2016-11-11 DIAGNOSIS — E784 Other hyperlipidemia: Secondary | ICD-10-CM

## 2016-11-11 DIAGNOSIS — E7849 Other hyperlipidemia: Secondary | ICD-10-CM

## 2016-11-11 DIAGNOSIS — E8881 Metabolic syndrome: Secondary | ICD-10-CM

## 2016-11-11 DIAGNOSIS — R7303 Prediabetes: Secondary | ICD-10-CM | POA: Diagnosis not present

## 2016-11-11 DIAGNOSIS — L700 Acne vulgaris: Secondary | ICD-10-CM

## 2016-11-11 DIAGNOSIS — E559 Vitamin D deficiency, unspecified: Secondary | ICD-10-CM

## 2016-11-11 LAB — COMPREHENSIVE METABOLIC PANEL
ALK PHOS: 75 U/L (ref 33–115)
ALT: 14 U/L (ref 6–29)
AST: 14 U/L (ref 10–30)
Albumin: 3.8 g/dL (ref 3.6–5.1)
BUN: 15 mg/dL (ref 7–25)
CO2: 28 mmol/L (ref 20–31)
CREATININE: 1.26 mg/dL — AB (ref 0.50–1.10)
Calcium: 9.3 mg/dL (ref 8.6–10.2)
Chloride: 101 mmol/L (ref 98–110)
Glucose, Bld: 97 mg/dL (ref 65–99)
Potassium: 3.8 mmol/L (ref 3.5–5.3)
SODIUM: 139 mmol/L (ref 135–146)
TOTAL PROTEIN: 6.7 g/dL (ref 6.1–8.1)
Total Bilirubin: 0.6 mg/dL (ref 0.2–1.2)

## 2016-11-11 LAB — CBC
HCT: 43 % (ref 35.0–45.0)
Hemoglobin: 14 g/dL (ref 11.7–15.5)
MCH: 28.7 pg (ref 27.0–33.0)
MCHC: 32.6 g/dL (ref 32.0–36.0)
MCV: 88.1 fL (ref 80.0–100.0)
MPV: 12.6 fL — ABNORMAL HIGH (ref 7.5–12.5)
PLATELETS: 209 10*3/uL (ref 140–400)
RBC: 4.88 MIL/uL (ref 3.80–5.10)
RDW: 13.4 % (ref 11.0–15.0)
WBC: 8 10*3/uL (ref 3.8–10.8)

## 2016-11-11 LAB — LIPID PANEL
CHOL/HDL RATIO: 3.6 ratio (ref <5.0)
CHOLESTEROL: 143 mg/dL (ref <200)
HDL: 40 mg/dL — ABNORMAL LOW (ref >50)
LDL Cholesterol: 91 mg/dL (ref <100)
Triglycerides: 61 mg/dL (ref <150)
VLDL: 12 mg/dL (ref <30)

## 2016-11-11 LAB — TSH: TSH: 1.47 m[IU]/L

## 2016-11-11 LAB — HEMOGLOBIN A1C
Hgb A1c MFr Bld: 5.8 % — ABNORMAL HIGH (ref <5.7)
MEAN PLASMA GLUCOSE: 120 mg/dL

## 2016-11-11 NOTE — Patient Instructions (Addendum)
F/u in 4.5 month, call if you need me before  30 mins every day of exercise and meditation combined, two 15 min sessions is good  75 % of intake fresh/ frozen fruit and veg  Water 54 oz daily  STOP eating after dinner  Commit 3 meals every day, snacking between with fruit or veg    Weight loss goal is 10 pounds  Tdap today  Thank you  for choosing [facility]. We consider it a privelige to serve you.  Delivering excellent health care in a caring and  compassionate way is our goal.  Partnering with you,  so that together we can achieve this goal is our strategy.   Please work on good  health habits so that your health will improve. 1. Commitment to daily physical activity for 30 to 60  minutes, if you are able to do this.  2. Commitment to wise food choices. Aim for half of your  food intake to be vegetable and fruit, one quarter starchy foods, and one quarter protein. Try to eat on a regular schedule  3 meals per day, snacking between meals should be limited to vegetables or fruits or small portions of nuts. 64 ounces of water per day is generally recommended, unless you have specific health conditions, like heart failure or kidney failure where you will need to limit fluid intake.  3. Commitment to sufficient and a  good quality of physical and mental rest daily, generally between 6 to 8 hours per day.  WITH PERSISTANCE AND PERSEVERANCE, THE IMPOSSIBLE , BECOMES THE NORM!

## 2016-11-11 NOTE — Assessment & Plan Note (Signed)
Deteriorated. Patient re-educated about  the importance of commitment to a  minimum of 150 minutes of exercise per week.  The importance of healthy food choices with portion control discussed. Encouraged to start a food diary, count calories and to consider  joining a support group. Sample diet sheets offered. Goals set by the patient for the next several months.   Weight /BMI 11/11/2016 07/12/2016 03/27/2016  WEIGHT 233 lb 235 lb 241 lb  HEIGHT 5\' 2"  5\' 2"  5\' 2"   BMI 42.62 kg/m2 42.98 kg/m2 44.07 kg/m2

## 2016-11-11 NOTE — Assessment & Plan Note (Signed)
After obtaining informed consent, the vaccine is  administered by LPN.  

## 2016-11-11 NOTE — Assessment & Plan Note (Signed)
Hyperlipidemia:Low fat diet discussed and encouraged.   Lipid Panel  Lab Results  Component Value Date   CHOL 155 11/02/2015   HDL 43 (L) 11/02/2015   LDLCALC 100 11/02/2015   TRIG 58 11/02/2015   CHOLHDL 3.6 11/02/2015     Updated lab needed

## 2016-11-11 NOTE — Assessment & Plan Note (Signed)
Controlled with as needed use of topical med

## 2016-11-11 NOTE — Assessment & Plan Note (Signed)
Updated lab needed at/ before next visit.   

## 2016-11-11 NOTE — Assessment & Plan Note (Signed)
Controlled, no change in medication DASH diet and commitment to daily physical activity for a minimum of 30 minutes discussed and encouraged, as a part of hypertension management. The importance of attaining a healthy weight is also discussed.  BP/Weight 11/11/2016 07/12/2016 03/27/2016 11/23/2015 07/19/2015 06/09/2015 Q000111Q  Systolic BP A999333 123XX123 123456 123XX123 123456 - 123XX123  Diastolic BP 84 84 84 84 80 - 82  Wt. (Lbs) 233 235 241 242 233.4 230 232  BMI 42.62 42.98 44.07 44.25 42.68 42.06 42.42

## 2016-11-11 NOTE — Progress Notes (Signed)
[name]     MRN: [id number]      DOB: Feb 13, 1976   HPI [name] is here for follow up and re-evaluation of chronic medical conditions, medication management and review of any available recent lab and radiology data.  Preventive health is updated, specifically  Cancer screening and Immunization.   Questions or concerns regarding consultations or procedures which the PT has had in the interim are  addressed. The PT denies any adverse reactions to current medications since the last visit.  There are no new concerns.  There are no specific complaints   ROS Denies recent fever or chills. Denies sinus pressure, nasal congestion, ear pain or sore throat. Denies chest congestion, productive cough or wheezing. Denies chest pains, palpitations and leg swelling Denies abdominal pain, nausea, vomiting,diarrhea or constipation.   Denies dysuria, frequency, hesitancy or incontinence. Denies joint pain, swelling and limitation in mobility. Denies headaches, seizures, numbness, or tingling. Denies depression, anxiety or insomnia. Denies skin break down or rash.   PE  BP 110/84   Pulse 83   Resp 16   Ht 5\' 2"  (1.575 m)   Wt 233 lb (105.7 kg)   SpO2 98%   BMI 42.62 kg/m   Patient alert and oriented and in no cardiopulmonary distress.  HEENT: No facial asymmetry, EOMI,   oropharynx pink and moist.  Neck supple no JVD, no mass.  Chest: Clear to auscultation bilaterally.  CVS: S1, S2 no murmurs, no S3.Regular rate.  ABD: Soft non tender.   Ext: No edema  MS: Adequate ROM spine, shoulders, hips and knees.  Skin: Intact, no ulcerations or rash noted.  Psych: Good eye contact, normal affect. Memory intact not anxious or depressed appearing.  CNS: CN 2-12 intact, power,  normal throughout.no focal deficits noted.   Assessment & Plan  Essential hypertension Controlled, no change in medication DASH diet and commitment to daily physical activity for a minimum of  30 minutes discussed and encouraged, as a part of hypertension management. The importance of attaining a healthy weight is also discussed.  BP/Weight 11/11/2016 07/12/2016 03/27/2016 11/23/2015 07/19/2015 06/09/2015 Q000111Q  Systolic BP A999333 123XX123 123456 123XX123 123456 - 123XX123  Diastolic BP 84 84 84 84 80 - 82  Wt. (Lbs) 233 235 241 242 233.4 230 232  BMI 42.62 42.98 44.07 44.25 42.68 42.06 42.42       Hyperlipemia Hyperlipidemia:Low fat diet discussed and encouraged.   Lipid Panel  Lab Results  Component Value Date   CHOL 155 11/02/2015   HDL 43 (L) 11/02/2015   LDLCALC 100 11/02/2015   TRIG 58 11/02/2015   CHOLHDL 3.6 11/02/2015     Updated lab needed    MORBID OBESITY Deteriorated. Patient re-educated about  the importance of commitment to a  minimum of 150 minutes of exercise per week.  The importance of healthy food choices with portion control discussed. Encouraged to start a food diary, count calories and to consider  joining a support group. Sample diet sheets offered. Goals set by the patient for the next several months.   Weight /BMI 11/11/2016 07/12/2016 03/27/2016  WEIGHT 233 lb 235 lb 241 lb  HEIGHT 5\' 2"  5\' 2"  5\' 2"   BMI 42.62 kg/m2 42.98 kg/m2 44.07 kg/m2      Prediabetes Patient educated about the importance of limiting  Carbohydrate intake , the need to commit to daily physical activity for a minimum of 30 minutes , and to commit weight loss. The fact that changes in all  these areas will reduce or eliminate all together the development of diabetes is stressed.   Diabetic Labs Latest Ref Rng & Units 11/02/2015 03/16/2015 09/12/2014 08/23/2014 01/08/2014  HbA1c <5.7 % 6.1(H) 6.0(H) 5.8(H) - 5.6  Chol 125 - 200 mg/dL 155 131 235(H) - 210(H)  HDL >=46 mg/dL 43(L) 36(L) 44 - 44  Calc LDL <130 mg/dL 100 84 175(H) - 149(H)  Triglycerides <150 mg/dL 58 55 80 - 87  Creatinine 0.50 - 1.10 mg/dL 1.16(H) 1.05 1.12(H) 1.33(H) 1.10   BP/Weight 11/11/2016 07/12/2016 03/27/2016  11/23/2015 07/19/2015 06/09/2015 Q000111Q  Systolic BP A999333 123XX123 123456 123XX123 123456 - 123XX123  Diastolic BP 84 84 84 84 80 - 82  Wt. (Lbs) 233 235 241 242 233.4 230 232  BMI 42.62 42.98 44.07 44.25 42.68 42.06 42.42   No flowsheet data found.  Updated lab needed    Acne vulgaris Controlled with as needed use of topical med  Metabolic syndrome X The increased risk of cardiovascular disease associated with this diagnosis, and the need to consistently work on lifestyle to change this is discussed. Following  a  heart healthy diet ,commitment to 30 minutes of exercise at least 5 days per week, as well as control of blood sugar and cholesterol , and achieving a healthy weight are all the areas to be addressed .   Need for Tdap vaccination After obtaining informed consent, the vaccine is  administered by LPN.   Vitamin D deficiency Updated lab needed at/ before next visit.

## 2016-11-11 NOTE — Assessment & Plan Note (Signed)
Patient educated about the importance of limiting  Carbohydrate intake , the need to commit to daily physical activity for a minimum of 30 minutes , and to commit weight loss. The fact that changes in all these areas will reduce or eliminate all together the development of diabetes is stressed.   Diabetic Labs Latest Ref Rng & Units 11/02/2015 03/16/2015 09/12/2014 08/23/2014 01/08/2014  HbA1c <5.7 % 6.1(H) 6.0(H) 5.8(H) - 5.6  Chol 125 - 200 mg/dL 155 131 235(H) - 210(H)  HDL >=46 mg/dL 43(L) 36(L) 44 - 44  Calc LDL <130 mg/dL 100 84 175(H) - 149(H)  Triglycerides <150 mg/dL 58 55 80 - 87  Creatinine 0.50 - 1.10 mg/dL 1.16(H) 1.05 1.12(H) 1.33(H) 1.10   BP/Weight 11/11/2016 07/12/2016 03/27/2016 11/23/2015 07/19/2015 06/09/2015 Q000111Q  Systolic BP A999333 123XX123 123456 123XX123 123456 - 123XX123  Diastolic BP 84 84 84 84 80 - 82  Wt. (Lbs) 233 235 241 242 233.4 230 232  BMI 42.62 42.98 44.07 44.25 42.68 42.06 42.42   No flowsheet data found.  Updated lab needed

## 2016-11-11 NOTE — Assessment & Plan Note (Signed)
The increased risk of cardiovascular disease associated with this diagnosis, and the need to consistently work on lifestyle to change this is discussed. Following  a  heart healthy diet ,commitment to 30 minutes of exercise at least 5 days per week, as well as control of blood sugar and cholesterol , and achieving a healthy weight are all the areas to be addressed .  

## 2016-11-12 ENCOUNTER — Encounter: Payer: Self-pay | Admitting: Family Medicine

## 2016-11-12 LAB — VITAMIN D 25 HYDROXY (VIT D DEFICIENCY, FRACTURES): VIT D 25 HYDROXY: 36 ng/mL (ref 30–100)

## 2016-11-20 ENCOUNTER — Encounter: Payer: Self-pay | Admitting: Family Medicine

## 2016-11-20 ENCOUNTER — Other Ambulatory Visit: Payer: Self-pay | Admitting: Family Medicine

## 2016-11-20 MED ORDER — AZITHROMYCIN 250 MG PO TABS: ORAL_TABLET | ORAL | 0 refills | Status: DC

## 2016-12-24 ENCOUNTER — Ambulatory Visit (HOSPITAL_COMMUNITY)
Admission: RE | Admit: 2016-12-24 | Discharge: 2016-12-24 | Disposition: A | Discharge status: Home / Self Care | Payer: BC Managed Care – PPO | Source: Ambulatory Visit | Attending: Family Medicine | Admitting: Family Medicine

## 2016-12-24 ENCOUNTER — Encounter: Payer: Self-pay | Admitting: Family Medicine

## 2016-12-24 ENCOUNTER — Ambulatory Visit (INDEPENDENT_AMBULATORY_CARE_PROVIDER_SITE_OTHER): Payer: BC Managed Care – PPO | Admitting: Family Medicine

## 2016-12-24 VITALS — BP 124/84 | HR 74 | Resp 16 | Ht 62.0 in | Wt 239.0 lb

## 2016-12-24 DIAGNOSIS — R05 Cough: Secondary | ICD-10-CM | POA: Diagnosis present

## 2016-12-24 DIAGNOSIS — M179 Osteoarthritis of knee, unspecified: Secondary | ICD-10-CM

## 2016-12-24 DIAGNOSIS — M1710 Unilateral primary osteoarthritis, unspecified knee: Secondary | ICD-10-CM

## 2016-12-24 DIAGNOSIS — J302 Other seasonal allergic rhinitis: Secondary | ICD-10-CM

## 2016-12-24 DIAGNOSIS — I10 Essential (primary) hypertension: Secondary | ICD-10-CM

## 2016-12-24 DIAGNOSIS — R059 Cough, unspecified: Secondary | ICD-10-CM

## 2016-12-24 MED ORDER — PREDNISONE 5 MG PO TABS: 5.0000 mg | ORAL_TABLET | Freq: Two times a day (BID) | ORAL | 0 refills | Status: AC

## 2016-12-24 MED ORDER — FLUTICASONE PROPIONATE 50 MCG/ACT NA SUSP: 2.0000 | Freq: Every day | NASAL | 5 refills | Status: DC

## 2016-12-24 MED ORDER — AZELASTINE HCL 0.1 % NA SOLN: 2.0000 | Freq: Two times a day (BID) | NASAL | 12 refills | Status: DC

## 2016-12-24 MED ORDER — LORATADINE 10 MG PO TBDP: 10.0000 mg | ORAL_TABLET | Freq: Every day | ORAL | 5 refills | Status: DC

## 2016-12-24 NOTE — Assessment & Plan Note (Signed)
Uncontrolled patient to commitment to daily Flonase Astelin and Claritin. She suffers with a with saline twice daily. Mucinex syrup as needed for symptoms.

## 2016-12-24 NOTE — Patient Instructions (Addendum)
Follow-up as before   You are treated for control allergies.  Please commit to daily Claritin and Flonase and Astelin, saline nasal flushes once or twice daily. Use Mucinex  as needed for symptom relief.  Five-day course of prednisone please take ibuprofen 800 mg 1 twice daily for the next 5 days for knee pain.   get the chest x-ray because of chronic cough this is ordered new just need to go to the radiology department.  Please work on good  health habits so that your health will improve. 1. Commitment to daily physical activity for 30 to 60  minutes, if you are able to do this.  2. Commitment to wise food choices. Aim for half of your  food intake to be vegetable and fruit, one quarter starchy foods, and one quarter protein. Try to eat on a regular schedule  3 meals per day, snacking between meals should be limited to vegetables or fruits or small portions of nuts. 64 ounces of water per day is generally recommended, unless you have specific health conditions, like heart failure or kidney failure where you will need to limit fluid intake.  3. Commitment to sufficient and a  good quality of physical and mental rest daily, generally between 6 to 8 hours per day.  WITH PERSISTANCE AND PERSEVERANCE, THE IMPOSSIBLE , BECOMES THE NORM! Thank you  for choosing [facility]. We consider it a privelige to serve you.  Delivering excellent health care in a caring and  compassionate way is our goal.  Partnering with you,  so that together we can achieve this goal is our strategy.

## 2016-12-25 ENCOUNTER — Encounter: Payer: Self-pay | Admitting: Family Medicine

## 2016-12-29 DIAGNOSIS — M179 Osteoarthritis of knee, unspecified: Secondary | ICD-10-CM | POA: Insufficient documentation

## 2016-12-29 DIAGNOSIS — M1710 Unilateral primary osteoarthritis, unspecified knee: Secondary | ICD-10-CM | POA: Insufficient documentation

## 2016-12-29 NOTE — Assessment & Plan Note (Signed)
Short course of prednisone and ibuprofen Weight loss and quad strengthening exerecise

## 2016-12-29 NOTE — Assessment & Plan Note (Signed)
Controlled, no change in medication DASH diet and commitment to daily physical activity for a minimum of 30 minutes discussed and encouraged, as a part of hypertension management. The importance of attaining a healthy weight is also discussed.  BP/Weight 12/24/2016 11/11/2016 07/12/2016 03/27/2016 11/23/2015 07/19/2015 123456  Systolic BP A999333 A999333 123XX123 123456 123XX123 123456 -  Diastolic BP 84 84 84 84 84 80 -  Wt. (Lbs) 239 233 235 241 242 233.4 230  BMI 43.71 42.62 42.98 44.07 44.25 42.68 42.06

## 2016-12-29 NOTE — Assessment & Plan Note (Signed)
Deteriorated. Patient re-educated about  the importance of commitment to a  minimum of 150 minutes of exercise per week.  The importance of healthy food choices with portion control discussed. Encouraged to start a food diary, count calories and to consider  joining a support group. Sample diet sheets offered. Goals set by the patient for the next several months.   Weight /BMI 12/24/2016 11/11/2016 07/12/2016  WEIGHT 239 lb 233 lb 235 lb  HEIGHT 5\' 2"  5\' 2"  5\' 2"   BMI 43.71 kg/m2 42.62 kg/m2 42.98 kg/m2

## 2016-12-29 NOTE — Progress Notes (Signed)
   [name]     MRN: [id number]      DOB: September 19, 1976   HPI [name] is here with 3 week h/o cough and sputum which at times is thick, generally scant production however. Also c/o head pressure and fullness and post nasal drainage. She denies muscle aches or fever. Poor exercise commitment, and weight is unchanged ROS  Denies chest pains, palpitations and leg swelling Denies abdominal pain, nausea, vomiting,diarrhea or constipation.   Denies dysuria, frequency, hesitancy or incontinence. C/o increased knee pain and stiffness with swelling x 2 weeks, no recent trauma Denies headaches, seizures, numbness, or tingling. Denies depression, anxiety or insomnia. Denies skin break down or rash.   PE  BP 124/84   Pulse 74   Resp 16   Ht 5\' 2"  (1.575 m)   Wt 239 lb (108.4 kg)   SpO2 99%   BMI 43.71 kg/m   Patient alert and oriented and in no cardiopulmonary distress.  HEENT: No facial asymmetry, EOMI,   oropharynx pink and moist.  Neck supple no JVD, no mass.Nasal mucosa  Erythematous and  edematous, TM clear  Bilaterally. Sinuses non tender  Chest: Clear to auscultation bilaterally.  CVS: S1, S2 no murmurs, no S3.Regular rate.  ABD: Soft non tender.   Ext: No edema  MS: Adequate ROM spine, shoulders, hips and reduced in  knees.  Skin: Intact, no ulcerations or rash noted.  Psych: Good eye contact, normal affect. Memory intact not anxious or depressed appearing.  CNS: CN 2-12 intact, power,  normal throughout.no focal deficits noted.   Assessment & Plan  Seasonal allergies Uncontrolled patient to commitment to daily Flonase Astelin and Claritin. She suffers with a with saline twice daily. Mucinex syrup as needed for symptoms.  Essential hypertension Controlled, no change in medication DASH diet and commitment to daily physical activity for a minimum of 30 minutes discussed and encouraged, as a part of hypertension management. The importance of  attaining a healthy weight is also discussed.  BP/Weight 12/24/2016 11/11/2016 07/12/2016 03/27/2016 11/23/2015 07/19/2015 123456  Systolic BP A999333 A999333 123XX123 123456 123XX123 123456 -  Diastolic BP 84 84 84 84 84 80 -  Wt. (Lbs) 239 233 235 241 242 233.4 230  BMI 43.71 42.62 42.98 44.07 44.25 42.68 42.06       MORBID OBESITY Deteriorated. Patient re-educated about  the importance of commitment to a  minimum of 150 minutes of exercise per week.  The importance of healthy food choices with portion control discussed. Encouraged to start a food diary, count calories and to consider  joining a support group. Sample diet sheets offered. Goals set by the patient for the next several months.   Weight /BMI 12/24/2016 11/11/2016 07/12/2016  WEIGHT 239 lb 233 lb 235 lb  HEIGHT 5\' 2"  5\' 2"  5\' 2"   BMI 43.71 kg/m2 42.62 kg/m2 42.98 kg/m2      Osteoarthrosis of knee Short course of prednisone and ibuprofen Weight loss and quad strengthening exerecise  ]

## 2016-12-29 NOTE — Assessment & Plan Note (Signed)
3 week h/o disturbing cogh with intermittent sputum, CXR

## 2017-03-17 ENCOUNTER — Encounter (HOSPITAL_COMMUNITY): Payer: Self-pay

## 2017-03-31 ENCOUNTER — Other Ambulatory Visit: Payer: Self-pay

## 2017-03-31 MED ORDER — TRIAMTERENE-HCTZ 75-50 MG PO TABS: 1.0000 | ORAL_TABLET | Freq: Every day | ORAL | 1 refills | Status: DC

## 2017-04-01 ENCOUNTER — Ambulatory Visit: Payer: Self-pay | Admitting: Family Medicine

## 2017-04-03 ENCOUNTER — Encounter: Payer: Self-pay | Admitting: Family Medicine

## 2017-04-03 ENCOUNTER — Other Ambulatory Visit: Payer: Self-pay | Admitting: Family Medicine

## 2017-04-03 ENCOUNTER — Ambulatory Visit (INDEPENDENT_AMBULATORY_CARE_PROVIDER_SITE_OTHER): Payer: BC Managed Care – PPO | Admitting: Family Medicine

## 2017-04-03 VITALS — BP 128/84 | HR 82 | Resp 16 | Ht 62.0 in | Wt 243.0 lb

## 2017-04-03 DIAGNOSIS — E8881 Metabolic syndrome: Secondary | ICD-10-CM | POA: Diagnosis not present

## 2017-04-03 DIAGNOSIS — M1712 Unilateral primary osteoarthritis, left knee: Secondary | ICD-10-CM

## 2017-04-03 DIAGNOSIS — E559 Vitamin D deficiency, unspecified: Secondary | ICD-10-CM | POA: Diagnosis not present

## 2017-04-03 DIAGNOSIS — R7303 Prediabetes: Secondary | ICD-10-CM | POA: Diagnosis not present

## 2017-04-03 DIAGNOSIS — J3089 Other allergic rhinitis: Secondary | ICD-10-CM | POA: Diagnosis not present

## 2017-04-03 DIAGNOSIS — I10 Essential (primary) hypertension: Secondary | ICD-10-CM | POA: Diagnosis not present

## 2017-04-03 DIAGNOSIS — E784 Other hyperlipidemia: Secondary | ICD-10-CM

## 2017-04-03 DIAGNOSIS — E7849 Other hyperlipidemia: Secondary | ICD-10-CM

## 2017-04-03 NOTE — Assessment & Plan Note (Signed)
Deteriorated. Patient re-educated about  the importance of commitment to a  minimum of 150 minutes of exercise per week.  The importance of healthy food choices with portion control discussed. Encouraged to start a food diary, count calories and to consider  joining a support group. Sample diet sheets offered. Goals set by the patient for the next several months.   Weight /BMI 04/03/2017 12/24/2016 11/11/2016  WEIGHT 243 lb 239 lb 233 lb  HEIGHT 5\' 2"  5\' 2"  5\' 2"   BMI 44.45 kg/m2 43.71 kg/m2 42.62 kg/m2

## 2017-04-03 NOTE — Assessment & Plan Note (Signed)
Corrected, convert to daily vit D

## 2017-04-03 NOTE — Patient Instructions (Signed)
F/u early November, call if you need me before   Fasting lipid, cmp and HBA1C 1 week before next visit  After you finish the once weekly vit D script convert to OTc daily vit D3 2000 IU  Change to ES tylenol 500 mg and topical biofreeze for knee pain when able instead of the ibuprofen , use that more sparingly as it it has more adverse s/e   It is important that you exercise regularly at least 30 minutes 6 times a week. If you develop chest pain, have severe difficulty breathing, or feel very tired, stop exercising immediately and seek medical attention   Please work on good  health habits so that your health will improve. 1. Commitment to daily physical activity for 30 to 60  minutes, if you are able to do this.  2. Commitment to wise food choices. Aim for half of your  food intake to be vegetable and fruit, one quarter starchy foods, and one quarter protein. Try to eat on a regular schedule  3 meals per day, snacking between meals should be limited to vegetables or fruits or small portions of nuts. 64 ounces of water per day is generally recommended, unless you have specific health conditions, like heart failure or kidney failure where you will need to limit fluid intake.  3. Commitment to sufficient and a  good quality of physical and mental rest daily, generally between 6 to 8 hours per day.  WITH PERSISTANCE AND PERSEVERANCE, THE IMPOSSIBLE , BECOMES THE NORM!

## 2017-04-03 NOTE — Assessment & Plan Note (Signed)
Controlled, no change in medication  

## 2017-04-03 NOTE — Assessment & Plan Note (Signed)
Hyperlipidemia:Low fat diet discussed and encouraged.   Lipid Panel  Lab Results  Component Value Date   CHOL 143 11/11/2016   HDL 40 (L) 11/11/2016   LDLCALC 91 11/11/2016   TRIG 61 11/11/2016   CHOLHDL 3.6 11/11/2016   Updated lab needed at/ before next visit. Needs to commit to daily exercise

## 2017-04-03 NOTE — Assessment & Plan Note (Signed)
Patient educated about the importance of limiting  Carbohydrate intake , the need to commit to daily physical activity for a minimum of 30 minutes , and to commit weight loss. The fact that changes in all these areas will reduce or eliminate all together the development of diabetes is stressed.   Diabetic Labs Latest Ref Rng & Units 11/11/2016 11/02/2015 03/16/2015 09/12/2014 08/23/2014  HbA1c <5.7 % 5.8(H) 6.1(H) 6.0(H) 5.8(H) -  Chol <200 mg/dL 143 155 131 235(H) -  HDL >50 mg/dL 40(L) 43(L) 36(L) 44 -  Calc LDL <100 mg/dL 91 100 84 175(H) -  Triglycerides <150 mg/dL 61 58 55 80 -  Creatinine 0.50 - 1.10 mg/dL 1.26(H) 1.16(H) 1.05 1.12(H) 1.33(H)   BP/Weight 04/03/2017 12/24/2016 11/11/2016 07/12/2016 03/27/2016 11/23/2015 6/96/7893  Systolic BP 810 175 102 585 277 824 235  Diastolic BP 84 84 84 84 84 84 80  Wt. (Lbs) 243 239 233 235 241 242 233.4  BMI 44.45 43.71 42.62 42.98 44.07 44.25 42.68   No flowsheet data found.  Updated lab needed at/ before next visit.

## 2017-04-03 NOTE — Assessment & Plan Note (Signed)
The increased risk of cardiovascular disease associated with this diagnosis, and the need to consistently work on lifestyle to change this is discussed. Following  a  heart healthy diet ,commitment to 30 minutes of exercise at least 5 days per week, as well as control of blood sugar and cholesterol , and achieving a healthy weight are all the areas to be addressed .  

## 2017-04-03 NOTE — Assessment & Plan Note (Signed)
Intermittent flares,  Recommend mor use of topical and tylenol and less ibuprofen, also weight loss and exercise

## 2017-04-03 NOTE — Assessment & Plan Note (Signed)
Controlled, no change in medication DASH diet and commitment to daily physical activity for a minimum of 30 minutes discussed and encouraged, as a part of hypertension management. The importance of attaining a healthy weight is also discussed.  BP/Weight 04/03/2017 12/24/2016 11/11/2016 07/12/2016 03/27/2016 11/23/2015 1/77/1165  Systolic BP 790 383 338 329 191 660 600  Diastolic BP 84 84 84 84 84 84 80  Wt. (Lbs) 243 239 233 235 241 242 233.4  BMI 44.45 43.71 42.62 42.98 44.07 44.25 42.68

## 2017-04-03 NOTE — Progress Notes (Signed)
[name]     MRN: [id number]      DOB: 14-Sep-1976   HPI [name] is here for follow up and re-evaluation of chronic medical conditions, medication management and review of any available recent lab and radiology data.  Preventive health is updated, specifically  Cancer screening and Immunization.   Questions or concerns regarding consultations or procedures which the PT has had in the interim are  addressed. The PT denies any adverse reactions to current medications since the last visit.  There are no new concerns.  There are no specific complaints   ROS Denies recent fever or chills. Denies sinus pressure, nasal congestion, ear pain or sore throat. Denies chest congestion, productive cough or wheezing. Denies chest pains, palpitations and leg swelling Denies abdominal pain, nausea, vomiting,diarrhea or constipation.   Denies dysuria, frequency, hesitancy or incontinence. Denies joint pain, swelling and limitation in mobility. Denies headaches, seizures, numbness, or tingling. Denies depression, anxiety or insomnia. Denies skin break down or rash.   PE  BP 128/84   Pulse 82   Resp 16   Ht 5\' 2"  (1.575 m)   Wt 243 lb (110.2 kg)   SpO2 98%   BMI 44.45 kg/m   Patient alert and oriented and in no cardiopulmonary distress.  HEENT: No facial asymmetry, EOMI,   oropharynx pink and moist.  Neck supple no JVD, no mass.  Chest: Clear to auscultation bilaterally.  CVS: S1, S2 no murmurs, no S3.Regular rate.  ABD: Soft non tender.   Ext: No edema  MS: Adequate ROM spine, shoulders, hips and knees.  Skin: Intact, no ulcerations or rash noted.  Psych: Good eye contact, normal affect. Memory intact not anxious or depressed appearing.  CNS: CN 2-12 intact, power,  normal throughout.no focal deficits noted.   Assessment & Plan  Essential hypertension Controlled, no change in medication DASH diet and commitment to daily physical activity for a minimum of  30 minutes discussed and encouraged, as a part of hypertension management. The importance of attaining a healthy weight is also discussed.  BP/Weight 04/03/2017 12/24/2016 11/11/2016 07/12/2016 03/27/2016 11/23/2015 3/53/2992  Systolic BP 426 834 196 222 979 892 119  Diastolic BP 84 84 84 84 84 84 80  Wt. (Lbs) 243 239 233 235 241 242 233.4  BMI 44.45 43.71 42.62 42.98 44.07 44.25 42.68       MORBID OBESITY Deteriorated. Patient re-educated about  the importance of commitment to a  minimum of 150 minutes of exercise per week.  The importance of healthy food choices with portion control discussed. Encouraged to start a food diary, count calories and to consider  joining a support group. Sample diet sheets offered. Goals set by the patient for the next several months.   Weight /BMI 04/03/2017 12/24/2016 11/11/2016  WEIGHT 243 lb 239 lb 233 lb  HEIGHT 5\' 2"  5\' 2"  5\' 2"   BMI 44.45 kg/m2 43.71 kg/m2 42.62 kg/m2      Vitamin D deficiency Corrected, convert to daily vit D  Prediabetes Patient educated about the importance of limiting  Carbohydrate intake , the need to commit to daily physical activity for a minimum of 30 minutes , and to commit weight loss. The fact that changes in all these areas will reduce or eliminate all together the development of diabetes is stressed.   Diabetic Labs Latest Ref Rng & Units 11/11/2016 11/02/2015 03/16/2015 09/12/2014 08/23/2014  HbA1c <5.7 % 5.8(H) 6.1(H) 6.0(H) 5.8(H) -  Chol <200 mg/dL 143 155 131  235(H) -  HDL >50 mg/dL 40(L) 43(L) 36(L) 44 -  Calc LDL <100 mg/dL 91 100 84 175(H) -  Triglycerides <150 mg/dL 61 58 55 80 -  Creatinine 0.50 - 1.10 mg/dL 1.26(H) 1.16(H) 1.05 1.12(H) 1.33(H)   BP/Weight 04/03/2017 12/24/2016 11/11/2016 07/12/2016 03/27/2016 11/23/2015 7/58/8325  Systolic BP 498 264 158 309 407 680 881  Diastolic BP 84 84 84 84 84 84 80  Wt. (Lbs) 243 239 233 235 241 242 233.4  BMI 44.45 43.71 42.62 42.98 44.07 44.25 42.68   No flowsheet data  found.  Updated lab needed at/ before next visit.   Metabolic syndrome X The increased risk of cardiovascular disease associated with this diagnosis, and the need to consistently work on lifestyle to change this is discussed. Following  a  heart healthy diet ,commitment to 30 minutes of exercise at least 5 days per week, as well as control of blood sugar and cholesterol , and achieving a healthy weight are all the areas to be addressed .   Hyperlipemia Hyperlipidemia:Low fat diet discussed and encouraged.   Lipid Panel  Lab Results  Component Value Date   CHOL 143 11/11/2016   HDL 40 (L) 11/11/2016   LDLCALC 91 11/11/2016   TRIG 61 11/11/2016   CHOLHDL 3.6 11/11/2016   Updated lab needed at/ before next visit. Needs to commit to daily exercise    Osteoarthrosis of knee Intermittent flares,  Recommend mor use of topical and tylenol and less ibuprofen, also weight loss and exercise  Seasonal allergies Controlled, no change in medication

## 2017-04-24 ENCOUNTER — Ambulatory Visit: Payer: Self-pay | Admitting: Family Medicine

## 2017-04-25 ENCOUNTER — Encounter (HOSPITAL_COMMUNITY): Payer: Self-pay | Admitting: Emergency Medicine

## 2017-04-25 ENCOUNTER — Ambulatory Visit (HOSPITAL_COMMUNITY)
Admission: EM | Admit: 2017-04-25 | Discharge: 2017-04-25 | Disposition: A | Discharge status: Home / Self Care | Payer: BC Managed Care – PPO | Attending: Internal Medicine | Admitting: Internal Medicine

## 2017-04-25 DIAGNOSIS — M7652 Patellar tendinitis, left knee: Secondary | ICD-10-CM | POA: Diagnosis not present

## 2017-04-25 MED ORDER — KETOROLAC TROMETHAMINE 30 MG/ML IJ SOLN
INTRAMUSCULAR | Status: AC
  Filled 2017-04-25: qty 1

## 2017-04-25 MED ORDER — KETOROLAC TROMETHAMINE 30 MG/ML IJ SOLN
30.0000 mg | Freq: Once | INTRAMUSCULAR | Status: AC
  Administered 2017-04-25: 30 mg via INTRAMUSCULAR

## 2017-04-25 NOTE — Discharge Instructions (Signed)
Motrin 800mg  one po three times a day for 10 days and can use home meds.  Wear ACE Wrap left knee daily.  Avoid excessive standing or walking up or down stairs.

## 2017-04-25 NOTE — ED Provider Notes (Signed)
CSN: [id number]     Arrival date & time 04/25/17  1256 History   None    Chief Complaint  Patient presents with  . Knee Pain   (Consider location/radiation/quality/duration/timing/severity/associated sxs/prior Treatment) Patient c/o left knee pain for 3 months.  C/o pain with walking up stairs.   The history is provided by the patient.  Knee Pain  Location:  Knee Time since incident:  3 months Injury: no   Knee location:  L knee Pain details:    Quality:  Aching   Severity:  Moderate   Onset quality:  Sudden   Duration:  3 months   Timing:  Constant Chronicity:  New Dislocation: no   Foreign body present:  No foreign bodies Tetanus status:  Unknown Relieved by:  Nothing Worsened by:  Nothing Ineffective treatments:  None tried   Past Medical History:  Diagnosis Date  . Allergic rhinitis   . Hyperlipidemia   . Hypertension   . Lumbar disc disease   . Obesity   . Syncope and collapse 09/2014   associated with over corrected htn, had cardiac and neurologic eval in 2015   Past Surgical History:  Procedure Laterality Date  . APPENDECTOMY    . CHOLECYSTECTOMY    . LAPAROSCOPIC GASTRIC BANDING  August 18, 2010   Family History  Problem Relation Age of Onset  . Hypertension Mother   . Hyperlipidemia Mother   . Diabetes Mother   . Cancer Mother        breast   . Obesity Mother   . Hypertension Brother   . Stroke Brother   . Heart disease Father   . Heart attack Father    Social History  Substance Use Topics  . Smoking status: Never Smoker  . Smokeless tobacco: Never Used  . Alcohol use No   OB History    No data available     Review of Systems  Constitutional: Negative.   HENT: Negative.   Eyes: Negative.   Respiratory: Negative.   Cardiovascular: Negative.   Gastrointestinal: Negative.   Endocrine: Negative.   Genitourinary: Negative.   Musculoskeletal: Positive for arthralgias.  Allergic/Immunologic: Negative.   Neurological: Negative.    Hematological: Negative.   Psychiatric/Behavioral: Negative.     Allergies  Hydrocodone-acetaminophen  Home Medications   Prior to Admission medications   Medication Sig Start Date End Date Taking? Authorizing Provider  amLODipine (NORVASC) 5 MG tablet TAKE 1 TABLET BY MOUTH DAILY 10/29/16  Yes Fayrene Helper, MD  atorvastatin (LIPITOR) 20 MG tablet TAKE 1 TABLET(20 MG) BY MOUTH DAILY 10/07/16  Yes Fayrene Helper, MD  azelastine (ASTELIN) 0.1 % nasal spray Place 2 sprays into both nostrils 2 (two) times daily. Use in each nostril as directed 12/24/16  Yes Fayrene Helper, MD  fluticasone [facility]) 50 MCG/ACT nasal spray Place 2 sprays into both nostrils daily. 12/24/16  Yes Fayrene Helper, MD  ibuprofen (ADVIL,MOTRIN) 800 MG tablet TAKE 1 TABLET(800 MG) BY MOUTH TWICE DAILY AS NEEDED 10/03/16  Yes Fayrene Helper, MD  triamterene-hydrochlorothiazide (MAXZIDE) 75-50 MG tablet Take 1 tablet by mouth daily. 03/31/17  Yes Fayrene Helper, MD  clindamycin-benzoyl peroxide [facility]) gel Apply topically 2 (two) times daily. 08/20/16   Fayrene Helper, MD  loratadine (CLARITIN REDITABS) 10 MG dissolvable tablet Take 1 tablet (10 mg total) by mouth daily. 12/24/16   Fayrene Helper, MD   Meds Ordered and Administered this Visit   Medications  ketorolac (TORADOL) 30 MG/ML injection  30 mg (not administered)    BP (!) 121/57 (BP Location: Right Arm)   Pulse 71   Temp 97.9 F (36.6 C) (Oral)   Resp 20   SpO2 100%  No data found.   Physical Exam  Constitutional: She appears well-developed and well-nourished.  HENT:  Head: Normocephalic and atraumatic.  Eyes: Conjunctivae and EOM are normal. Pupils are equal, round, and reactive to light.  Neck: Normal range of motion. Neck supple.  Cardiovascular: Normal rate, regular rhythm and normal heart sounds.   Pulmonary/Chest: Effort normal and breath sounds normal.  Nursing note and vitals reviewed.   Urgent  Care Course     Procedures (including critical care time)  Labs Review Labs Reviewed - No data to display  Imaging Review No results found.   Visual Acuity Review  Right Eye Distance:   Left Eye Distance:   Bilateral Distance:    Right Eye Near:   Left Eye Near:    Bilateral Near:         MDM   1. Patellar tendonitis of left knee    Motrin 800 mg po tid from home ACE wrap left knee     Lysbeth Penner, FNP 04/25/17 1414

## 2017-04-25 NOTE — ED Triage Notes (Signed)
Pt c/o intermittent left knee pain onset 3 months but has become more constant the past week w/increased pain  Pain increases w/activity  Denies inj/trauma  A&O x4... NAD... Ambulatory

## 2017-04-28 ENCOUNTER — Other Ambulatory Visit: Payer: Self-pay | Admitting: Family Medicine

## 2017-04-28 ENCOUNTER — Other Ambulatory Visit: Payer: Self-pay | Admitting: General Surgery

## 2017-04-28 DIAGNOSIS — Z9884 Bariatric surgery status: Secondary | ICD-10-CM

## 2017-04-28 MED ORDER — PREDNISONE 5 MG (21) PO TBPK: 5.0000 mg | ORAL_TABLET | ORAL | 0 refills | Status: DC

## 2017-05-06 ENCOUNTER — Other Ambulatory Visit (HOSPITAL_COMMUNITY): Payer: Self-pay | Admitting: General Surgery

## 2017-05-06 DIAGNOSIS — Z9884 Bariatric surgery status: Secondary | ICD-10-CM

## 2017-05-07 ENCOUNTER — Telehealth: Payer: Self-pay | Admitting: Family Medicine

## 2017-05-07 NOTE — Telephone Encounter (Signed)
Patient called and left a message on nurse line. States that she was seen for ankle and knee pain and was given prednisone, which helped, but now pain is back and has "spread to ankle on same leg" and is swollen, requesting a call back at [phone]     Returned patient call, left generic message to return call.

## 2017-05-09 ENCOUNTER — Ambulatory Visit (HOSPITAL_COMMUNITY)
Admission: RE | Admit: 2017-05-09 | Discharge: 2017-05-09 | Disposition: A | Discharge status: Home / Self Care | Payer: BC Managed Care – PPO | Source: Ambulatory Visit | Attending: General Surgery | Admitting: General Surgery

## 2017-05-09 DIAGNOSIS — K449 Diaphragmatic hernia without obstruction or gangrene: Secondary | ICD-10-CM | POA: Diagnosis not present

## 2017-05-09 DIAGNOSIS — K228 Other specified diseases of esophagus: Secondary | ICD-10-CM | POA: Insufficient documentation

## 2017-05-09 DIAGNOSIS — Z9884 Bariatric surgery status: Secondary | ICD-10-CM | POA: Diagnosis not present

## 2017-06-02 ENCOUNTER — Other Ambulatory Visit: Payer: Self-pay | Admitting: Family Medicine

## 2017-06-23 ENCOUNTER — Other Ambulatory Visit: Payer: Self-pay | Admitting: Family Medicine

## 2017-06-27 ENCOUNTER — Encounter: Payer: Self-pay | Admitting: Family Medicine

## 2017-08-04 ENCOUNTER — Other Ambulatory Visit: Payer: Self-pay | Admitting: Family Medicine

## 2017-08-05 MED ORDER — CETIRIZINE HCL 10 MG PO TABS: 10.0000 mg | ORAL_TABLET | Freq: Every day | ORAL | 0 refills | Status: DC

## 2017-09-02 ENCOUNTER — Ambulatory Visit: Payer: Self-pay | Admitting: Family Medicine

## 2017-09-06 ENCOUNTER — Other Ambulatory Visit: Payer: Self-pay | Admitting: Family Medicine

## 2017-09-08 ENCOUNTER — Other Ambulatory Visit: Payer: Self-pay | Admitting: Family Medicine

## 2017-09-08 MED ORDER — CETIRIZINE HCL 10 MG PO TABS: 10.0000 mg | ORAL_TABLET | Freq: Every day | ORAL | 0 refills | Status: DC

## 2017-09-22 ENCOUNTER — Other Ambulatory Visit: Payer: Self-pay | Admitting: Family Medicine

## 2017-09-30 ENCOUNTER — Encounter: Payer: Self-pay | Admitting: Family Medicine

## 2017-09-30 ENCOUNTER — Ambulatory Visit (INDEPENDENT_AMBULATORY_CARE_PROVIDER_SITE_OTHER): Payer: BC Managed Care – PPO | Admitting: Family Medicine

## 2017-09-30 VITALS — BP 118/84 | HR 80 | Resp 16 | Ht 63.0 in | Wt 241.0 lb

## 2017-09-30 DIAGNOSIS — E8881 Metabolic syndrome: Secondary | ICD-10-CM

## 2017-09-30 DIAGNOSIS — R7303 Prediabetes: Secondary | ICD-10-CM | POA: Diagnosis not present

## 2017-09-30 DIAGNOSIS — Z23 Encounter for immunization: Secondary | ICD-10-CM | POA: Diagnosis not present

## 2017-09-30 DIAGNOSIS — J302 Other seasonal allergic rhinitis: Secondary | ICD-10-CM

## 2017-09-30 DIAGNOSIS — I10 Essential (primary) hypertension: Secondary | ICD-10-CM | POA: Diagnosis not present

## 2017-09-30 DIAGNOSIS — E7849 Other hyperlipidemia: Secondary | ICD-10-CM

## 2017-09-30 LAB — COMPLETE METABOLIC PANEL WITH GFR
AG RATIO: 1.2 (calc) (ref 1.0–2.5)
ALKALINE PHOSPHATASE (APISO): 73 U/L (ref 33–115)
ALT: 11 U/L (ref 6–29)
AST: 12 U/L (ref 10–30)
Albumin: 3.6 g/dL (ref 3.6–5.1)
BILIRUBIN TOTAL: 0.4 mg/dL (ref 0.2–1.2)
BUN / CREAT RATIO: 16 (calc) (ref 6–22)
BUN: 21 mg/dL (ref 7–25)
CHLORIDE: 103 mmol/L (ref 98–110)
CO2: 30 mmol/L (ref 20–32)
Calcium: 9.3 mg/dL (ref 8.6–10.2)
Creat: 1.31 mg/dL — ABNORMAL HIGH (ref 0.50–1.10)
GFR, Est African American: 58 mL/min/{1.73_m2} — ABNORMAL LOW (ref >=60)
GFR, Est Non African American: 50 mL/min/{1.73_m2} — ABNORMAL LOW (ref >=60)
GLUCOSE: 88 mg/dL (ref 65–99)
Globulin: 3 g/dL (calc) (ref 1.9–3.7)
Potassium: 3.6 mmol/L (ref 3.5–5.3)
Sodium: 139 mmol/L (ref 135–146)
Total Protein: 6.6 g/dL (ref 6.1–8.1)

## 2017-09-30 LAB — LIPID PANEL
CHOL/HDL RATIO: 6 (calc) — AB (ref <5.0)
Cholesterol: 239 mg/dL — ABNORMAL HIGH (ref <200)
HDL: 40 mg/dL — AB (ref >50)
LDL CHOLESTEROL (CALC): 182 mg/dL — AB
NON-HDL CHOLESTEROL (CALC): 199 mg/dL — AB (ref <130)
TRIGLYCERIDES: 71 mg/dL (ref <150)

## 2017-09-30 LAB — HEMOGLOBIN A1C
Hgb A1c MFr Bld: 5.9 % of total Hgb — ABNORMAL HIGH (ref <5.7)
MEAN PLASMA GLUCOSE: 123 (calc)
eAG (mmol/L): 6.8 (calc)

## 2017-09-30 MED ORDER — ATORVASTATIN CALCIUM 20 MG PO TABS: 20.0000 mg | ORAL_TABLET | Freq: Every day | ORAL | 3 refills | Status: DC

## 2017-09-30 MED ORDER — AZELASTINE HCL 0.1 % NA SOLN
2.0000 | Freq: Two times a day (BID) | NASAL | 12 refills | Status: DC
Start: 2017-09-30 — End: 2019-05-17

## 2017-09-30 NOTE — Assessment & Plan Note (Signed)
Controlled, no change in medication DASH diet and commitment to daily physical activity for a minimum of 30 minutes discussed and encouraged, as a part of hypertension management. The importance of attaining a healthy weight is also discussed.  BP/Weight 09/30/2017 04/25/2017 04/03/2017 12/24/2016 11/11/2016 07/12/2016 8/84/1660  Systolic BP 630 160 109 323 557 322 025  Diastolic BP 84 57 84 84 84 84 84  Wt. (Lbs) 241 - 243 239 233 235 241  BMI 42.69 - 44.45 43.71 42.62 42.98 44.07

## 2017-09-30 NOTE — Assessment & Plan Note (Signed)
Unchanged Patient re-educated about  the importance of commitment to a  minimum of 150 minutes of exercise per week.  The importance of healthy food choices with portion control discussed. Encouraged to start a food diary, count calories and to consider  joining a support group. Sample diet sheets offered. Goals set by the patient for the next several months.   Weight /BMI 09/30/2017 04/03/2017 12/24/2016  WEIGHT 241 lb 243 lb 239 lb  HEIGHT 5\' 3"  5\' 2"  5\' 2"   BMI 42.69 kg/m2 44.45 kg/m2 43.71 kg/m2

## 2017-09-30 NOTE — Assessment & Plan Note (Signed)
Uncontrolled , add Astelin daily

## 2017-09-30 NOTE — Patient Instructions (Addendum)
F/u in 4 month, call if you need me sooner  Need to change food cjoice , CLEAN, GREEN  Please start daily lipitor for high cholesterol  Blood sugar is higher so need clean , green eating  Kidney function not 100 % , again dash diet / clean green  Congrats on weight loss and good blood pressure  Flu vaccine today  Astelin sent for daily use for allergies  CBC, fasting lipid, cmp and eGFR, hBA1C 1 week  Before follow up    [place] stands for "Dietary Approaches to Stop Hypertension." The DASH eating plan is a healthy eating plan that has been shown to reduce high blood pressure (hypertension). It may also reduce your risk for type 2 diabetes, heart disease, and stroke. The DASH eating plan may also help with weight loss. What are tips for following this plan? General guidelines  Avoid eating more than 2,300 mg (milligrams) of salt (sodium) a day. If you have hypertension, you may need to reduce your sodium intake to 1,500 mg a day.  Limit alcohol intake to no more than 1 drink a day for nonpregnant women and 2 drinks a day for men. One drink equals 12 oz of beer, 5 oz of wine, or 1 oz of hard liquor.  Work with your health care provider to maintain a healthy body weight or to lose weight. Ask what an ideal weight is for you.  Get at least 30 minutes of exercise that causes your heart to beat faster (aerobic exercise) most days of the week. Activities may include walking, swimming, or biking.  Work with your health care provider or diet and nutrition specialist (dietitian) to adjust your eating plan to your individual calorie needs. Reading food labels  Check food labels for the amount of sodium per serving. Choose foods with less than 5 percent of the Daily Value of sodium. Generally, foods with less than 300 mg of sodium per serving fit into this eating plan.  To find whole grains, look for the word "whole" as the first word in the ingredient list. Shopping  Buy  products labeled as "low-sodium" or "no salt added."  Buy fresh foods. Avoid canned foods and premade or frozen meals. Cooking  Avoid adding salt when cooking. Use salt-free seasonings or herbs instead of table salt or sea salt. Check with your health care provider or pharmacist before using salt substitutes.  Do not fry foods. Cook foods using healthy methods such as baking, boiling, grilling, and broiling instead.  Cook with heart-healthy oils, such as olive, canola, soybean, or sunflower oil. Meal planning   Eat a balanced diet that includes: ? 5 or more servings of fruits and vegetables each day. At each meal, try to fill half of your plate with fruits and vegetables. ? Up to 6-8 servings of whole grains each day. ? Less than 6 oz of lean meat, poultry, or fish each day. A 3-oz serving of meat is about the same size as a deck of cards. One egg equals 1 oz. ? 2 servings of low-fat dairy each day. ? A serving of nuts, seeds, or beans 5 times each week. ? Heart-healthy fats. Healthy fats called Omega-3 fatty acids are found in foods such as flaxseeds and coldwater fish, like sardines, salmon, and mackerel.  Limit how much you eat of the following: ? Canned or prepackaged foods. ? Food that is high in trans fat, such as fried foods. ? Food that is high in saturated  fat, such as fatty meat. ? Sweets, desserts, sugary drinks, and other foods with added sugar. ? Full-fat dairy products.  Do not salt foods before eating.  Try to eat at least 2 vegetarian meals each week.  Eat more home-cooked food and less restaurant, buffet, and fast food.  When eating at a restaurant, ask that your food be prepared with less salt or no salt, if possible. What foods are recommended? The items listed may not be a complete list. Talk with your dietitian about what dietary choices are best for you. Grains Whole-grain or whole-wheat bread. Whole-grain or whole-wheat pasta. Brown rice. [place].  Bulgur. Whole-grain and low-sodium cereals. Pita bread. Low-fat, low-sodium crackers. Whole-wheat flour tortillas. Vegetables Fresh or frozen vegetables (raw, steamed, roasted, or grilled). Low-sodium or reduced-sodium tomato and vegetable juice. Low-sodium or reduced-sodium tomato sauce and tomato paste. Low-sodium or reduced-sodium canned vegetables. Fruits All fresh, dried, or frozen fruit. Canned fruit in natural juice (without added sugar). Meat and other protein foods Skinless chicken or [place]. Ground chicken or [place]. Pork with fat trimmed off. Fish and seafood. Egg whites. Dried beans, peas, or lentils. Unsalted nuts, nut butters, and seeds. Unsalted canned beans. Lean cuts of beef with fat trimmed off. Low-sodium, lean deli meat. Dairy Low-fat (1%) or fat-free (skim) milk. Fat-free, low-fat, or reduced-fat cheeses. Nonfat, low-sodium ricotta or cottage cheese. Low-fat or nonfat yogurt. Low-fat, low-sodium cheese. Fats and oils Soft margarine without trans fats. Vegetable oil. Low-fat, reduced-fat, or light mayonnaise and salad dressings (reduced-sodium). Canola, safflower, olive, soybean, and sunflower oils. Avocado. Seasoning and other foods Herbs. Spices. Seasoning mixes without salt. Unsalted popcorn and pretzels. Fat-free sweets. What foods are not recommended? The items listed may not be a complete list. Talk with your dietitian about what dietary choices are best for you. Grains Baked goods made with fat, such as croissants, muffins, or some breads. Dry pasta or rice meal packs. Vegetables Creamed or fried vegetables. Vegetables in a cheese sauce. Regular canned vegetables (not low-sodium or reduced-sodium). Regular canned tomato sauce and paste (not low-sodium or reduced-sodium). Regular tomato and vegetable juice (not low-sodium or reduced-sodium). Angie Fava. Olives. Fruits Canned fruit in a light or heavy syrup. Fried fruit. Fruit in cream or butter sauce. Meat and other  protein foods Fatty cuts of meat. Ribs. Fried meat. Berniece Salines. Sausage. Bologna and other processed lunch meats. Salami. Fatback. Hotdogs. Bratwurst. Salted nuts and seeds. Canned beans with added salt. Canned or smoked fish. Whole eggs or egg yolks. Chicken or [place] with skin. Dairy Whole or 2% milk, cream, and half-and-half. Whole or full-fat cream cheese. Whole-fat or sweetened yogurt. Full-fat cheese. Nondairy creamers. Whipped toppings. Processed cheese and cheese spreads. Fats and oils Butter. Stick margarine. Lard. Shortening. Ghee. Bacon fat. Tropical oils, such as coconut, palm kernel, or palm oil. Seasoning and other foods Salted popcorn and pretzels. Onion salt, garlic salt, seasoned salt, table salt, and sea salt. Worcestershire sauce. Tartar sauce. Barbecue sauce. Teriyaki sauce. Soy sauce, including reduced-sodium. Steak sauce. Canned and packaged gravies. Fish sauce. Oyster sauce. Cocktail sauce. Horseradish that you find on the shelf. Ketchup. Mustard. Meat flavorings and tenderizers. Bouillon cubes. Hot sauce and Tabasco sauce. Premade or packaged marinades. Premade or packaged taco seasonings. Relishes. Regular salad dressings. Where to find more information:  National Heart, Lung, and [place]: https://wilson-eaton.com/  American Heart Association: www.heart.org Summary  The DASH eating plan is a healthy eating plan that has been shown to reduce high blood pressure (hypertension). It may also reduce  your risk for type 2 diabetes, heart disease, and stroke.  With the DASH eating plan, you should limit salt (sodium) intake to 2,300 mg a day. If you have hypertension, you may need to reduce your sodium intake to 1,500 mg a day.  When on the DASH eating plan, aim to eat more fresh fruits and vegetables, whole grains, lean proteins, low-fat dairy, and heart-healthy fats.  Work with your health care provider or diet and nutrition specialist (dietitian) to adjust your eating plan to your  individual calorie needs. This information is not intended to replace advice given to you by your health care provider. Make sure you discuss any questions you have with your health care provider. Document Released: 10/03/2011 Document Revised: 10/07/2016 Document Reviewed: 10/07/2016 Elsevier Interactive Patient Education  2017 [employer].

## 2017-09-30 NOTE — Assessment & Plan Note (Signed)
Uncontrolled with high Cv risk Start statin and change diet  Updated lab needed at/ before next visit. Hyperlipidemia:Low fat diet discussed and encouraged.   Lipid Panel  Lab Results  Component Value Date   CHOL 239 (H) 09/29/2017   HDL 40 (L) 09/29/2017   LDLCALC 91 11/11/2016   TRIG 71 09/29/2017   CHOLHDL 6.0 (H) 09/29/2017

## 2017-09-30 NOTE — Progress Notes (Signed)
[name]     MRN: [id number]      DOB: February 05, 1976   HPI [name] is here for follow up and re-evaluation of chronic medical conditions, medication management and review of any available recent lab and radiology data.  Preventive health is updated, specifically  Cancer screening and Immunization.   Questions or concerns regarding consultations or procedures which the PT has had in the interim are  addressed. The PT denies any adverse reactions to current medications since the last visit.  2 week h/o increased nasal congestion with clear drainage Not exercising as regularly as she should , maybe twice weekly ROS Denies recent fever or chills. Denies sinus pressure,  ear pain or sore throat. Denies chest congestion, productive cough or wheezing. Denies chest pains, palpitations and leg swelling Denies abdominal pain, nausea, vomiting,diarrhea or constipation.   Denies dysuria, frequency, hesitancy or incontinence. Denies joint pain, swelling and limitation in mobility. Denies headaches, seizures, numbness, or tingling. Denies depression, anxiety or insomnia. Denies skin break down or rash.   PE  BP 118/84   Pulse 80   Resp 16   Ht 5\' 3"  (1.6 m)   Wt 241 lb (109.3 kg)   SpO2 96%   BMI 42.69 kg/m   Patient alert and oriented and in no cardiopulmonary distress.  HEENT: No facial asymmetry, EOMI,   oropharynx pink and moist.  Neck supple no JVD, no mass.No sinus tenderness, TM clear, edema and erythema of nasal mucosa  Chest: Clear to auscultation bilaterally.  CVS: S1, S2 no murmurs, no S3.Regular rate.  ABD: Soft non tender.   Ext: No edema  MS: Adequate ROM spine, shoulders, hips and knees.  Skin: Intact, no ulcerations or rash noted.  Psych: Good eye contact, normal affect. Memory intact not anxious or depressed appearing.  CNS: CN 2-12 intact, power,  normal throughout.no focal deficits noted.   Assessment & Plan  Essential  hypertension Controlled, no change in medication DASH diet and commitment to daily physical activity for a minimum of 30 minutes discussed and encouraged, as a part of hypertension management. The importance of attaining a healthy weight is also discussed.  BP/Weight 09/30/2017 04/25/2017 04/03/2017 12/24/2016 11/11/2016 07/12/2016 9/48/5462  Systolic BP 703 500 938 182 993 716 967  Diastolic BP 84 57 84 84 84 84 84  Wt. (Lbs) 241 - 243 239 233 235 241  BMI 42.69 - 44.45 43.71 42.62 42.98 44.07       Hyperlipemia Uncontrolled with high Cv risk Start statin and change diet  Updated lab needed at/ before next visit. Hyperlipidemia:Low fat diet discussed and encouraged.   Lipid Panel  Lab Results  Component Value Date   CHOL 239 (H) 09/29/2017   HDL 40 (L) 09/29/2017   LDLCALC 91 11/11/2016   TRIG 71 09/29/2017   CHOLHDL 6.0 (H) 09/29/2017        MORBID OBESITY Unchanged Patient re-educated about  the importance of commitment to a  minimum of 150 minutes of exercise per week.  The importance of healthy food choices with portion control discussed. Encouraged to start a food diary, count calories and to consider  joining a support group. Sample diet sheets offered. Goals set by the patient for the next several months.   Weight /BMI 09/30/2017 04/03/2017 12/24/2016  WEIGHT 241 lb 243 lb 239 lb  HEIGHT 5\' 3"  5\' 2"  5\' 2"   BMI 42.69 kg/m2 44.45 kg/m2 43.71 kg/m2      Metabolic syndrome X The increased  risk of cardiovascular disease associated with this diagnosis, and the need to consistently work on lifestyle to change this is discussed. Following  a  heart healthy diet ,commitment to 30 minutes of exercise at least 5 days per week, as well as control of blood sugar and cholesterol , and achieving a healthy weight are all the areas to be addressed .   Prediabetes Deteriorated Patient educated about the importance of limiting  Carbohydrate intake , the need to commit to daily  physical activity for a minimum of 30 minutes , and to commit weight loss. The fact that changes in all these areas will reduce or eliminate all together the development of diabetes is stressed.   Diabetic Labs Latest Ref Rng & Units 09/29/2017 11/11/2016 11/02/2015 03/16/2015 09/12/2014  HbA1c <5.7 % of total Hgb 5.9(H) 5.8(H) 6.1(H) 6.0(H) 5.8(H)  Chol <200 mg/dL 239(H) 143 155 131 235(H)  HDL >50 mg/dL 40(L) 40(L) 43(L) 36(L) 44  Calc LDL <100 mg/dL - 91 100 84 175(H)  Triglycerides <150 mg/dL 71 61 58 55 80  Creatinine 0.50 - 1.10 mg/dL 1.31(H) 1.26(H) 1.16(H) 1.05 1.12(H)   BP/Weight 09/30/2017 04/25/2017 04/03/2017 12/24/2016 11/11/2016 07/12/2016 04/29/5008  Systolic BP 381 829 937 169 678 938 101  Diastolic BP 84 57 84 84 84 84 84  Wt. (Lbs) 241 - 243 239 233 235 241  BMI 42.69 - 44.45 43.71 42.62 42.98 44.07   No flowsheet data found.     Seasonal allergies Uncontrolled , add Astelin daily

## 2017-09-30 NOTE — Assessment & Plan Note (Signed)
Deteriorated Patient educated about the importance of limiting  Carbohydrate intake , the need to commit to daily physical activity for a minimum of 30 minutes , and to commit weight loss. The fact that changes in all these areas will reduce or eliminate all together the development of diabetes is stressed.   Diabetic Labs Latest Ref Rng & Units 09/29/2017 11/11/2016 11/02/2015 03/16/2015 09/12/2014  HbA1c <5.7 % of total Hgb 5.9(H) 5.8(H) 6.1(H) 6.0(H) 5.8(H)  Chol <200 mg/dL 239(H) 143 155 131 235(H)  HDL >50 mg/dL 40(L) 40(L) 43(L) 36(L) 44  Calc LDL <100 mg/dL - 91 100 84 175(H)  Triglycerides <150 mg/dL 71 61 58 55 80  Creatinine 0.50 - 1.10 mg/dL 1.31(H) 1.26(H) 1.16(H) 1.05 1.12(H)   BP/Weight 09/30/2017 04/25/2017 04/03/2017 12/24/2016 11/11/2016 07/12/2016 9/97/7414  Systolic BP 239 532 023 343 568 616 837  Diastolic BP 84 57 84 84 84 84 84  Wt. (Lbs) 241 - 243 239 233 235 241  BMI 42.69 - 44.45 43.71 42.62 42.98 44.07   No flowsheet data found.

## 2017-09-30 NOTE — Assessment & Plan Note (Signed)
The increased risk of cardiovascular disease associated with this diagnosis, and the need to consistently work on lifestyle to change this is discussed. Following  a  heart healthy diet ,commitment to 30 minutes of exercise at least 5 days per week, as well as control of blood sugar and cholesterol , and achieving a healthy weight are all the areas to be addressed .  

## 2017-11-10 ENCOUNTER — Other Ambulatory Visit: Payer: Self-pay | Admitting: Family Medicine

## 2017-12-29 ENCOUNTER — Other Ambulatory Visit: Payer: Self-pay | Admitting: Family Medicine

## 2018-02-09 ENCOUNTER — Other Ambulatory Visit: Payer: Self-pay | Admitting: Family Medicine

## 2018-03-08 ENCOUNTER — Encounter: Payer: Self-pay | Admitting: Family Medicine

## 2018-03-18 ENCOUNTER — Other Ambulatory Visit: Payer: Self-pay | Admitting: Family Medicine

## 2018-03-18 ENCOUNTER — Encounter: Payer: Self-pay | Admitting: Family Medicine

## 2018-03-18 MED ORDER — AZITHROMYCIN 250 MG PO TABS: ORAL_TABLET | ORAL | 0 refills | Status: DC

## 2018-03-27 ENCOUNTER — Other Ambulatory Visit: Payer: Self-pay | Admitting: Family Medicine

## 2018-05-04 ENCOUNTER — Other Ambulatory Visit: Payer: Self-pay

## 2018-05-04 ENCOUNTER — Other Ambulatory Visit: Payer: Self-pay | Admitting: Family Medicine

## 2018-05-04 MED ORDER — TRIAMTERENE-HCTZ 75-50 MG PO TABS: 1.0000 | ORAL_TABLET | Freq: Every day | ORAL | 2 refills | Status: DC

## 2018-05-06 ENCOUNTER — Ambulatory Visit: Payer: BC Managed Care – PPO | Admitting: Family Medicine

## 2018-05-17 ENCOUNTER — Encounter: Payer: Self-pay | Admitting: Family Medicine

## 2018-05-18 ENCOUNTER — Encounter: Payer: Self-pay | Admitting: Family Medicine

## 2018-05-21 ENCOUNTER — Telehealth: Payer: Self-pay | Admitting: Family Medicine

## 2018-05-21 DIAGNOSIS — E8881 Metabolic syndrome: Secondary | ICD-10-CM

## 2018-05-21 DIAGNOSIS — E559 Vitamin D deficiency, unspecified: Secondary | ICD-10-CM

## 2018-05-21 DIAGNOSIS — R7303 Prediabetes: Secondary | ICD-10-CM

## 2018-05-21 DIAGNOSIS — E7849 Other hyperlipidemia: Secondary | ICD-10-CM

## 2018-05-21 DIAGNOSIS — I10 Essential (primary) hypertension: Secondary | ICD-10-CM

## 2018-05-21 NOTE — Telephone Encounter (Signed)
pls send lab orde to [facility], she is going tomorrow morning cBC, fasting lipid, cmp and EGFR, hBA1C, tSH and vit D, thanks you

## 2018-05-21 NOTE — Telephone Encounter (Signed)
Labs entered.

## 2018-05-23 ENCOUNTER — Encounter: Payer: Self-pay | Admitting: Family Medicine

## 2018-05-23 LAB — CBC
HCT: 41.8 % (ref 35.0–45.0)
Hemoglobin: 13.9 g/dL (ref 11.7–15.5)
MCH: 28.2 pg (ref 27.0–33.0)
MCHC: 33.3 g/dL (ref 32.0–36.0)
MCV: 84.8 fL (ref 80.0–100.0)
MPV: 13.3 fL — AB (ref 7.5–12.5)
PLATELETS: 204 10*3/uL (ref 140–400)
RBC: 4.93 10*6/uL (ref 3.80–5.10)
RDW: 12.7 % (ref 11.0–15.0)
WBC: 7.1 10*3/uL (ref 3.8–10.8)

## 2018-05-23 LAB — HEMOGLOBIN A1C
EAG (MMOL/L): 7 (calc)
Hgb A1c MFr Bld: 6 % of total Hgb — ABNORMAL HIGH (ref <5.7)
MEAN PLASMA GLUCOSE: 126 (calc)

## 2018-05-23 LAB — COMPLETE METABOLIC PANEL WITH GFR
AG Ratio: 1.3 (calc) (ref 1.0–2.5)
ALBUMIN MSPROF: 3.9 g/dL (ref 3.6–5.1)
ALT: 15 U/L (ref 6–29)
AST: 11 U/L (ref 10–30)
Alkaline phosphatase (APISO): 90 U/L (ref 33–115)
BUN / CREAT RATIO: 15 (calc) (ref 6–22)
BUN: 18 mg/dL (ref 7–25)
CALCIUM: 9.5 mg/dL (ref 8.6–10.2)
CO2: 31 mmol/L (ref 20–32)
CREATININE: 1.17 mg/dL — AB (ref 0.50–1.10)
Chloride: 101 mmol/L (ref 98–110)
GFR, EST NON AFRICAN AMERICAN: 57 mL/min/{1.73_m2} — AB (ref >=60)
GFR, Est African American: 67 mL/min/{1.73_m2} (ref >=60)
GLOBULIN: 2.9 g/dL (ref 1.9–3.7)
Glucose, Bld: 94 mg/dL (ref 65–99)
Potassium: 3.8 mmol/L (ref 3.5–5.3)
SODIUM: 138 mmol/L (ref 135–146)
TOTAL PROTEIN: 6.8 g/dL (ref 6.1–8.1)
Total Bilirubin: 0.5 mg/dL (ref 0.2–1.2)

## 2018-05-23 LAB — LIPID PANEL
CHOLESTEROL: 167 mg/dL (ref <200)
HDL: 36 mg/dL — ABNORMAL LOW (ref >50)
LDL Cholesterol (Calc): 115 mg/dL (calc) — ABNORMAL HIGH
Non-HDL Cholesterol (Calc): 131 mg/dL (calc) — ABNORMAL HIGH (ref <130)
Total CHOL/HDL Ratio: 4.6 (calc) (ref <5.0)
Triglycerides: 67 mg/dL (ref <150)

## 2018-05-23 LAB — TSH: TSH: 1.1 mIU/L

## 2018-05-23 LAB — VITAMIN D 25 HYDROXY (VIT D DEFICIENCY, FRACTURES): VIT D 25 HYDROXY: 25 ng/mL — AB (ref 30–100)

## 2018-05-26 ENCOUNTER — Encounter: Payer: Self-pay | Admitting: Family Medicine

## 2018-05-26 ENCOUNTER — Ambulatory Visit (INDEPENDENT_AMBULATORY_CARE_PROVIDER_SITE_OTHER): Payer: BC Managed Care – PPO | Admitting: Family Medicine

## 2018-05-26 ENCOUNTER — Other Ambulatory Visit: Payer: Self-pay

## 2018-05-26 VITALS — BP 116/68 | HR 75 | Resp 12 | Ht 63.0 in | Wt 239.1 lb

## 2018-05-26 DIAGNOSIS — I10 Essential (primary) hypertension: Secondary | ICD-10-CM | POA: Diagnosis not present

## 2018-05-26 DIAGNOSIS — R7303 Prediabetes: Secondary | ICD-10-CM | POA: Diagnosis not present

## 2018-05-26 DIAGNOSIS — E559 Vitamin D deficiency, unspecified: Secondary | ICD-10-CM

## 2018-05-26 DIAGNOSIS — E8881 Metabolic syndrome: Secondary | ICD-10-CM

## 2018-05-26 DIAGNOSIS — E785 Hyperlipidemia, unspecified: Secondary | ICD-10-CM | POA: Diagnosis not present

## 2018-05-26 NOTE — Patient Instructions (Addendum)
F/U in 6 months, call if you need me before  Weight loss goal of 12 pounds    Please change food choice and eat on schedule as we discussed  Fasting lipid, cmp and EGFR and HBA1C and vit D 5 days in 5 . 5  Months ( [facility])  It is important that you exercise regularly at least 30 minutes 5 times a week. If you develop chest pain, have severe difficulty breathing, or feel very tired, stop exercising immediately and seek medical attention  Thank you  for choosing [facility]. We consider it a privelige to serve you.  Delivering excellent health care in a caring and  compassionate way is our goal.  Partnering with you,  so that together we can achieve this goal is our strategy.   _

## 2018-05-30 ENCOUNTER — Encounter: Payer: Self-pay | Admitting: Family Medicine

## 2018-05-30 NOTE — Assessment & Plan Note (Signed)
Patient educated about the importance of limiting  Carbohydrate intake , the need to commit to daily physical activity for a minimum of 30 minutes , and to commit weight loss. The fact that changes in all these areas will reduce or eliminate all together the development of diabetes is stressed.   Diabetic Labs Latest Ref Rng & Units 05/22/2018 09/29/2017 11/11/2016 11/02/2015 03/16/2015  HbA1c <5.7 % of total Hgb 6.0(H) 5.9(H) 5.8(H) 6.1(H) 6.0(H)  Chol <200 mg/dL 167 239(H) 143 155 131  HDL >50 mg/dL 36(L) 40(L) 40(L) 43(L) 36(L)  Calc LDL mg/dL (calc) 115(H) 182(H) 91 100 84  Triglycerides <150 mg/dL 67 71 61 58 55  Creatinine 0.50 - 1.10 mg/dL 1.17(H) 1.31(H) 1.26(H) 1.16(H) 1.05   BP/Weight 05/26/2018 09/30/2017 04/25/2017 04/03/2017 12/24/2016 11/11/2016 2/67/1245  Systolic BP 809 983 382 505 397 673 419  Diastolic BP 68 84 57 84 84 84 84  Wt. (Lbs) 239.12 241 - 243 239 233 235  BMI 42.36 42.69 - 44.45 43.71 42.62 42.98   Deteriorated

## 2018-05-30 NOTE — Assessment & Plan Note (Signed)
The increased risk of cardiovascular disease associated with this diagnosis, and the need to consistently work on lifestyle to change this is discussed. Following  a  heart healthy diet ,commitment to 30 minutes of exercise at least 5 days per week, as well as control of blood sugar and cholesterol , and achieving a healthy weight are all the areas to be addressed .  

## 2018-05-30 NOTE — Progress Notes (Signed)
[name]     MRN: [id number]      DOB: June 04, 1976   HPI [name] is here for follow up and re-evaluation of chronic medical conditions, medication management and review of any available recent lab and radiology data.  Preventive health is updated, specifically  Cancer screening and Immunization.   Questions or concerns regarding consultations or procedures which the PT has had in the interim are  addressed. The PT denies any adverse reactions to current medications since the last visit.  C/o weight and difficulty with weight loss, has recently started a program with a personal drainer and intends to continue with this Increased personal stress caring for her Mother with the recent passing of her Dad especially inde her older brother is irresponsible  ROS Denies recent fever or chills. Denies sinus pressure, nasal congestion, ear pain or sore throat. Denies chest congestion, productive cough or wheezing. Denies chest pains, palpitations and leg swelling Denies abdominal pain, nausea, vomiting,diarrhea or constipation.   Denies dysuria, frequency, hesitancy or incontinence. Denies joint pain, swelling and limitation in mobility. Denies headaches, seizures, numbness, or tingling. Denies depression, anxiety or insomnia. Denies skin break down or rash.   PE  BP 116/68 (BP Location: Left Arm, Patient Position: Sitting, Cuff Size: Large)   Pulse 75   Resp 12   Ht 5\' 3"  (1.6 m)   Wt 239 lb 1.9 oz (108.5 kg)   SpO2 97%   BMI 42.36 kg/m   Patient alert and oriented and in no cardiopulmonary distress.  HEENT: No facial asymmetry, EOMI,   oropharynx pink and moist.  Neck supple no JVD, no mass.  Chest: Clear to auscultation bilaterally.  CVS: S1, S2 no murmurs, no S3.Regular rate.  ABD: Soft non tender.   Ext: No edema  MS: Adequate ROM spine, shoulders, hips and knees.  Skin: Intact, no ulcerations or rash noted.  Psych: Good eye contact, normal affect.  Memory intact not anxious or depressed appearing.  CNS: CN 2-12 intact, power,  normal throughout.no focal deficits noted.   Assessment & Plan  Essential hypertension Controlled, no change in medication DASH diet and commitment to daily physical activity for a minimum of 30 minutes discussed and encouraged, as a part of hypertension management. The importance of attaining a healthy weight is also discussed.  BP/Weight 05/26/2018 09/30/2017 04/25/2017 04/03/2017 12/24/2016 11/11/2016 3/61/4431  Systolic BP 540 086 761 950 932 671 245  Diastolic BP 68 84 57 84 84 84 84  Wt. (Lbs) 239.12 241 - 243 239 233 235  BMI 42.36 42.69 - 44.45 43.71 42.62 42.98       MORBID OBESITY Unchanged essentially. Patient re-educated about  the importance of commitment to a  minimum of 150 minutes of exercise per week.  The importance of healthy food choices with portion control discussed. Encouraged to start a food diary, count calories and to consider  joining a support group. Sample diet sheets offered. Goals set by the patient for the next several months.   Weight /BMI 05/26/2018 09/30/2017 04/03/2017  WEIGHT 239 lb 1.9 oz 241 lb 243 lb  HEIGHT 5\' 3"  5\' 3"  5\' 2"   BMI 42.36 kg/m2 42.69 kg/m2 44.45 kg/m2      Hyperlipemia Hyperlipidemia:Low fat diet discussed and encouraged.   Lipid Panel  Lab Results  Component Value Date   CHOL 167 05/22/2018   HDL 36 (L) 05/22/2018   LDLCALC 115 (H) 05/22/2018   TRIG 67 05/22/2018   CHOLHDL 4.6 05/22/2018  Not at goal , needs to increase exercise and reduce fat intake    Prediabetes Patient educated about the importance of limiting  Carbohydrate intake , the need to commit to daily physical activity for a minimum of 30 minutes , and to commit weight loss. The fact that changes in all these areas will reduce or eliminate all together the development of diabetes is stressed.   Diabetic Labs Latest Ref Rng & Units 05/22/2018 09/29/2017 11/11/2016 11/02/2015  03/16/2015  HbA1c <5.7 % of total Hgb 6.0(H) 5.9(H) 5.8(H) 6.1(H) 6.0(H)  Chol <200 mg/dL 167 239(H) 143 155 131  HDL >50 mg/dL 36(L) 40(L) 40(L) 43(L) 36(L)  Calc LDL mg/dL (calc) 115(H) 182(H) 91 100 84  Triglycerides <150 mg/dL 67 71 61 58 55  Creatinine 0.50 - 1.10 mg/dL 1.17(H) 1.31(H) 1.26(H) 1.16(H) 1.05   BP/Weight 05/26/2018 09/30/2017 04/25/2017 04/03/2017 12/24/2016 11/11/2016 0/81/4481  Systolic BP 856 314 970 263 785 885 027  Diastolic BP 68 84 57 84 84 84 84  Wt. (Lbs) 239.12 241 - 243 239 233 235  BMI 42.36 42.69 - 44.45 43.71 42.62 42.98   Deteriorated   Metabolic syndrome X The increased risk of cardiovascular disease associated with this diagnosis, and the need to consistently work on lifestyle to change this is discussed. Following  a  heart healthy diet ,commitment to 30 minutes of exercise at least 5 days per week, as well as control of blood sugar and cholesterol , and achieving a healthy weight are all the areas to be addressed .

## 2018-05-30 NOTE — Assessment & Plan Note (Signed)
Unchanged essentially. Patient re-educated about  the importance of commitment to a  minimum of 150 minutes of exercise per week.  The importance of healthy food choices with portion control discussed. Encouraged to start a food diary, count calories and to consider  joining a support group. Sample diet sheets offered. Goals set by the patient for the next several months.   Weight /BMI 05/26/2018 09/30/2017 04/03/2017  WEIGHT 239 lb 1.9 oz 241 lb 243 lb  HEIGHT 5\' 3"  5\' 3"  5\' 2"   BMI 42.36 kg/m2 42.69 kg/m2 44.45 kg/m2

## 2018-05-30 NOTE — Assessment & Plan Note (Signed)
Hyperlipidemia:Low fat diet discussed and encouraged.   Lipid Panel  Lab Results  Component Value Date   CHOL 167 05/22/2018   HDL 36 (L) 05/22/2018   LDLCALC 115 (H) 05/22/2018   TRIG 67 05/22/2018   CHOLHDL 4.6 05/22/2018   Not at goal , needs to increase exercise and reduce fat intake

## 2018-05-30 NOTE — Assessment & Plan Note (Signed)
Controlled, no change in medication DASH diet and commitment to daily physical activity for a minimum of 30 minutes discussed and encouraged, as a part of hypertension management. The importance of attaining a healthy weight is also discussed.  BP/Weight 05/26/2018 09/30/2017 04/25/2017 04/03/2017 12/24/2016 11/11/2016 4/61/9012  Systolic BP 224 114 643 142 767 011 003  Diastolic BP 68 84 57 84 84 84 84  Wt. (Lbs) 239.12 241 - 243 239 233 235  BMI 42.36 42.69 - 44.45 43.71 42.62 42.98

## 2018-07-09 ENCOUNTER — Other Ambulatory Visit: Payer: Self-pay | Admitting: Family Medicine

## 2018-08-13 ENCOUNTER — Ambulatory Visit: Payer: BC Managed Care – PPO | Admitting: Family Medicine

## 2018-08-16 ENCOUNTER — Encounter: Payer: Self-pay | Admitting: Family Medicine

## 2018-10-25 ENCOUNTER — Encounter: Payer: Self-pay | Admitting: Family Medicine

## 2018-11-10 ENCOUNTER — Ambulatory Visit (INDEPENDENT_AMBULATORY_CARE_PROVIDER_SITE_OTHER): Payer: BC Managed Care – PPO | Admitting: Family Medicine

## 2018-11-10 ENCOUNTER — Encounter: Payer: Self-pay | Admitting: Family Medicine

## 2018-11-10 VITALS — BP 114/82 | HR 83 | Resp 14 | Ht 63.0 in | Wt 237.1 lb

## 2018-11-10 DIAGNOSIS — E7800 Pure hypercholesterolemia, unspecified: Secondary | ICD-10-CM

## 2018-11-10 DIAGNOSIS — R7303 Prediabetes: Secondary | ICD-10-CM

## 2018-11-10 DIAGNOSIS — J0100 Acute maxillary sinusitis, unspecified: Secondary | ICD-10-CM | POA: Diagnosis not present

## 2018-11-10 DIAGNOSIS — I10 Essential (primary) hypertension: Secondary | ICD-10-CM | POA: Diagnosis not present

## 2018-11-10 DIAGNOSIS — J209 Acute bronchitis, unspecified: Secondary | ICD-10-CM

## 2018-11-10 MED ORDER — FLUCONAZOLE 150 MG PO TABS: ORAL_TABLET | ORAL | 0 refills | Status: DC

## 2018-11-10 MED ORDER — SULFAMETHOXAZOLE-TRIMETHOPRIM 800-160 MG PO TABS: 1.0000 | ORAL_TABLET | Freq: Two times a day (BID) | ORAL | 0 refills | Status: DC

## 2018-11-10 MED ORDER — BENZONATATE 100 MG PO CAPS: 100.0000 mg | ORAL_CAPSULE | Freq: Two times a day (BID) | ORAL | 0 refills | Status: DC | PRN

## 2018-11-10 MED ORDER — PREDNISONE 5 MG PO TABS: ORAL_TABLET | ORAL | 0 refills | Status: DC

## 2018-11-10 NOTE — Progress Notes (Signed)
[name]     MRN: [id number]      DOB: 1976-10-12   HPI [name]  2 week h/o worsening head and chest congestion, associated with fever and chills intermittently. Nasal drainage has thickened , and is yellowish green, and at times bloody. Sputum is thick and yellow. C/o bilateral ear pressure, denies hearing loss and sore throat. Increasing fatigue , poor appetitie and sleep disturbed by cough. No improvement with OTC medication.    ROS  Denies chest pains, palpitations and leg swelling Denies abdominal pain, nausea, vomiting,diarrhea or constipation.   Denies dysuria, frequency, hesitancy or incontinence. Denies joint pain, swelling and limitation in mobility. Denies headaches, seizures, numbness, or tingling. Denies depression, anxiety or insomnia. Denies skin break down or rash.   PE  BP 114/82 (BP Location: Right Arm, Patient Position: Sitting, Cuff Size: Large)   Pulse 83   Resp 14   Ht 5\' 3"  (1.6 m)   Wt 237 lb 1.9 oz (107.6 kg)   SpO2 98% Comment: room air  BMI 42.00 kg/m   Patient alert and oriented and in no cardiopulmonary distress.  HEENT: No facial asymmetry, EOMI,   oropharynx pink and moist.  Neck supple no JVD, no mass.Maxillary sinus tenderness, mild erythema of oropharynx, no exudate, nasal congestion and erythema  Chest: decreased air entry bilateral crackles no wheezes.  CVS: S1, S2 no murmurs, no S3.Regular rate.  ABD: Soft non tender.   Ext: No edema  MS: Adequate ROM spine, shoulders, hips and knees.  Skin: Intact, no ulcerations or rash noted.  Psych: Good eye contact, normal affect. Memory intact not anxious or depressed appearing.  CNS: CN 2-12 intact, power,  normal throughout.no focal deficits noted.   Assessment & Plan  Acute bronchitis Antibiotic and decongestant prescribed  Acute sinusitis Antibiotic prescribed  Essential hypertension Controlled, no change in medication DASH diet and commitment to  daily physical activity for a minimum of 30 minutes discussed and encouraged, as a part of hypertension management. The importance of attaining a healthy weight is also discussed.  BP/Weight 11/10/2018 05/26/2018 09/30/2017 04/25/2017 04/03/2017 12/24/2016 07/21/2682  Systolic BP 419 622 297 989 211 941 740  Diastolic BP 82 68 84 57 84 84 84  Wt. (Lbs) 237.12 239.12 241 - 243 239 233  BMI 42 42.36 42.69 - 44.45 43.71 42.62       Hyperlipemia Hyperlipidemia:Low fat diet discussed and encouraged.   Lipid Panel  Lab Results  Component Value Date   CHOL 167 05/22/2018   HDL 36 (L) 05/22/2018   LDLCALC 115 (H) 05/22/2018   TRIG 67 05/22/2018   CHOLHDL 4.6 05/22/2018   Needs to reduce fatty food intake    MORBID OBESITY Unchanged. Patient re-educated about  the importance of commitment to a  minimum of 150 minutes of exercise per week.  The importance of healthy food choices with portion control discussed. Encouraged to start a food diary, count calories and to consider  joining a support group. Sample diet sheets offered. Goals set by the patient for the next several months.   Weight /BMI 11/10/2018 05/26/2018 09/30/2017  WEIGHT 237 lb 1.9 oz 239 lb 1.9 oz 241 lb  HEIGHT 5\' 3"  5\' 3"  5\' 3"   BMI 42 kg/m2 42.36 kg/m2 42.69 kg/m2      Prediabetes Patient educated about the importance of limiting  Carbohydrate intake , the need to commit to daily physical activity for a minimum of 30 minutes , and to commit weight loss.  The fact that changes in all these areas will reduce or eliminate all together the development of diabetes is stressed.   Diabetic Labs Latest Ref Rng & Units 05/22/2018 09/29/2017 11/11/2016 11/02/2015 03/16/2015  HbA1c <5.7 % of total Hgb 6.0(H) 5.9(H) 5.8(H) 6.1(H) 6.0(H)  Chol <200 mg/dL 167 239(H) 143 155 131  HDL >50 mg/dL 36(L) 40(L) 40(L) 43(L) 36(L)  Calc LDL mg/dL (calc) 115(H) 182(H) 91 100 84  Triglycerides <150 mg/dL 67 71 61 58 55  Creatinine 0.50 - 1.10  mg/dL 1.17(H) 1.31(H) 1.26(H) 1.16(H) 1.05   BP/Weight 11/10/2018 05/26/2018 09/30/2017 04/25/2017 04/03/2017 12/24/2016 3/96/8864  Systolic BP 847 207 218 288 337 445 146  Diastolic BP 82 68 84 57 84 84 84  Wt. (Lbs) 237.12 239.12 241 - 243 239 233  BMI 42 42.36 42.69 - 44.45 43.71 42.62   No flowsheet data found.

## 2018-11-10 NOTE — Patient Instructions (Signed)
Please cancel Jan follow up and rescheduile for 2nd week in May, call if you need me sooner  Medication is sent for sinusitis and bronchitis  Work excuse from 01/14 to  Return 11/13/2018  Please get fasting labs already ordered in the next 1 week, you will be given printed lab order at your requewt  It is important that you exercise regularly at least 30 minutes 5 times a week. If you develop chest pain, have severe difficulty breathing, or feel very tired, stop exercising immediately and seek medical attention   Mindful eating/ plant based , 10 pounds off is our goal

## 2018-11-14 ENCOUNTER — Encounter: Payer: Self-pay | Admitting: Family Medicine

## 2018-11-14 NOTE — Assessment & Plan Note (Signed)
Controlled, no change in medication DASH diet and commitment to daily physical activity for a minimum of 30 minutes discussed and encouraged, as a part of hypertension management. The importance of attaining a healthy weight is also discussed.  BP/Weight 11/10/2018 05/26/2018 09/30/2017 04/25/2017 04/03/2017 12/24/2016 2/35/5732  Systolic BP 202 542 706 237 628 315 176  Diastolic BP 82 68 84 57 84 84 84  Wt. (Lbs) 237.12 239.12 241 - 243 239 233  BMI 42 42.36 42.69 - 44.45 43.71 42.62

## 2018-11-14 NOTE — Assessment & Plan Note (Signed)
Patient educated about the importance of limiting  Carbohydrate intake , the need to commit to daily physical activity for a minimum of 30 minutes , and to commit weight loss. The fact that changes in all these areas will reduce or eliminate all together the development of diabetes is stressed.   Diabetic Labs Latest Ref Rng & Units 05/22/2018 09/29/2017 11/11/2016 11/02/2015 03/16/2015  HbA1c <5.7 % of total Hgb 6.0(H) 5.9(H) 5.8(H) 6.1(H) 6.0(H)  Chol <200 mg/dL 167 239(H) 143 155 131  HDL >50 mg/dL 36(L) 40(L) 40(L) 43(L) 36(L)  Calc LDL mg/dL (calc) 115(H) 182(H) 91 100 84  Triglycerides <150 mg/dL 67 71 61 58 55  Creatinine 0.50 - 1.10 mg/dL 1.17(H) 1.31(H) 1.26(H) 1.16(H) 1.05   BP/Weight 11/10/2018 05/26/2018 09/30/2017 04/25/2017 04/03/2017 12/24/2016 5/59/7416  Systolic BP 384 536 468 032 122 482 500  Diastolic BP 82 68 84 57 84 84 84  Wt. (Lbs) 237.12 239.12 241 - 243 239 233  BMI 42 42.36 42.69 - 44.45 43.71 42.62   No flowsheet data found.

## 2018-11-14 NOTE — Assessment & Plan Note (Signed)
Antibiotic and decongestant prescribed 

## 2018-11-14 NOTE — Assessment & Plan Note (Signed)
Hyperlipidemia:Low fat diet discussed and encouraged.   Lipid Panel  Lab Results  Component Value Date   CHOL 167 05/22/2018   HDL 36 (L) 05/22/2018   LDLCALC 115 (H) 05/22/2018   TRIG 67 05/22/2018   CHOLHDL 4.6 05/22/2018   Needs to reduce fatty food intake

## 2018-11-14 NOTE — Assessment & Plan Note (Signed)
Unchanged. Patient re-educated about  the importance of commitment to a  minimum of 150 minutes of exercise per week.  The importance of healthy food choices with portion control discussed. Encouraged to start a food diary, count calories and to consider  joining a support group. Sample diet sheets offered. Goals set by the patient for the next several months.   Weight /BMI 11/10/2018 05/26/2018 09/30/2017  WEIGHT 237 lb 1.9 oz 239 lb 1.9 oz 241 lb  HEIGHT 5\' 3"  5\' 3"  5\' 3"   BMI 42 kg/m2 42.36 kg/m2 42.69 kg/m2

## 2018-11-14 NOTE — Assessment & Plan Note (Signed)
Antibiotic prescribed 

## 2018-11-16 ENCOUNTER — Other Ambulatory Visit: Payer: Self-pay | Admitting: Family Medicine

## 2018-11-16 ENCOUNTER — Encounter: Payer: Self-pay | Admitting: Family Medicine

## 2018-11-16 ENCOUNTER — Telehealth: Payer: Self-pay | Admitting: *Deleted

## 2018-11-16 ENCOUNTER — Ambulatory Visit (HOSPITAL_COMMUNITY)
Admission: RE | Admit: 2018-11-16 | Discharge: 2018-11-16 | Disposition: A | Discharge status: Home / Self Care | Payer: BC Managed Care – PPO | Source: Ambulatory Visit | Attending: Family Medicine | Admitting: Family Medicine

## 2018-11-16 DIAGNOSIS — J40 Bronchitis, not specified as acute or chronic: Secondary | ICD-10-CM | POA: Diagnosis present

## 2018-11-16 MED ORDER — ALBUTEROL SULFATE HFA 108 (90 BASE) MCG/ACT IN AERS: 2.0000 | INHALATION_SPRAY | Freq: Four times a day (QID) | RESPIRATORY_TRACT | 0 refills | Status: DC | PRN

## 2018-11-16 NOTE — Telephone Encounter (Signed)
Done

## 2018-11-16 NOTE — Telephone Encounter (Signed)
Would like the albuterol called in also, and thank-you!

## 2018-11-16 NOTE — Telephone Encounter (Signed)
Spoke with patient and printed a work note for her to pick up. Stated that she would like a chest X-ray ordered because her chest feels tight

## 2018-11-16 NOTE — Telephone Encounter (Signed)
Pt called she was saw in the office 11-10-18 and was taken out of work for Tuesday - Thursday was to return on Friday. Pt was not feeling well on Friday and wanted a note to be taken out through 11-16-18. She states she feels like she can return tomorrow.

## 2018-11-16 NOTE — Telephone Encounter (Signed)
OK to do this 

## 2018-11-16 NOTE — Progress Notes (Signed)
Dg cxr  

## 2018-11-16 NOTE — Telephone Encounter (Signed)
Yes pls do, if having a lot of sinus pressure or productive cough needs X rys, pls let me know

## 2018-11-16 NOTE — Telephone Encounter (Signed)
Ordered, offer albuterol MDI, klet me know if she wants this

## 2018-11-26 ENCOUNTER — Ambulatory Visit: Payer: Self-pay | Admitting: Family Medicine

## 2018-12-13 ENCOUNTER — Other Ambulatory Visit: Payer: Self-pay | Admitting: Family Medicine

## 2018-12-30 ENCOUNTER — Ambulatory Visit (INDEPENDENT_AMBULATORY_CARE_PROVIDER_SITE_OTHER): Payer: BC Managed Care – PPO | Admitting: Family Medicine

## 2018-12-30 ENCOUNTER — Ambulatory Visit: Payer: Self-pay | Admitting: Family Medicine

## 2018-12-30 ENCOUNTER — Encounter: Payer: Self-pay | Admitting: Family Medicine

## 2018-12-30 VITALS — BP 130/90 | HR 90 | Temp 98.9°F | Resp 14 | Ht 62.0 in | Wt 236.1 lb

## 2018-12-30 DIAGNOSIS — J3089 Other allergic rhinitis: Secondary | ICD-10-CM

## 2018-12-30 DIAGNOSIS — J4520 Mild intermittent asthma, uncomplicated: Secondary | ICD-10-CM | POA: Diagnosis not present

## 2018-12-30 MED ORDER — LEVOCETIRIZINE DIHYDROCHLORIDE 5 MG PO TABS: 5.0000 mg | ORAL_TABLET | Freq: Every evening | ORAL | 1 refills | Status: DC

## 2018-12-30 MED ORDER — AZELASTINE & FLUTICASONE 137 & 50 MCG/ACT NA THPK: 50.0000 ug | PACK | Freq: Two times a day (BID) | NASAL | 1 refills | Status: DC

## 2018-12-30 NOTE — Progress Notes (Signed)
Acute Office Visit  Subjective:    Patient ID: [name], female    DOB: 06-28-1976, 43 y.o.   MRN: [id number]  Chief Complaint  Patient presents with  . Cough    started Friday, feels better today but gets tired if she talks a lot [employer])  . chest congestion    HPI [name], is a 43 year old female patient today who presents after having a return of symptoms.  Back in January she was treated by Dr. Griffin Dakin for a upper respiratory, bronchitis infection.  Was out of work for a week, was placed on prednisone, Bactrim, albuterol inhaler.  Chest x-ray at that time was normal.  Reports that she got to feeling better for about 2 to 5 days and then started worsening again. Worsening symptoms included a cough, nasal congestion, shortness of breath, popping in the ears, watery eyes, scratchy throat with some mild hoarseness.  Denies being around anybody with the flu.  Denies headache, body aches, fevers, chills.  Does report some increasing fatigue.  Reports her shortness of breath occurs when she gets winded secondary to talking or active doing activity.  Reports she started using her inhaler again this past Sunday and this has helped some.  Reports when she does cough or have drainage is usually clear in nature.  Stay out of work per herself on Monday.  Went back on Tuesday.  Today she was sent home secondary to her signs and symptoms.  Needs a work note to return to work.  Past Medical History:  Diagnosis Date  . Allergic rhinitis   . Hyperlipidemia   . Hypertension   . Lumbar disc disease   . Obesity   . Syncope and collapse 09/2014   associated with over corrected htn, had cardiac and neurologic eval in 2015    Past Surgical History:  Procedure Laterality Date  . APPENDECTOMY    . CHOLECYSTECTOMY    . LAPAROSCOPIC GASTRIC BANDING  August 18, 2010    Family History  Problem Relation Age of Onset  . Hypertension Mother   . Hyperlipidemia Mother   . Diabetes  Mother   . Cancer Mother        breast   . Obesity Mother   . Hypertension Brother   . Stroke Brother   . Heart disease Father   . Heart attack Father     Social History   Socioeconomic History  . Marital status: Married    Spouse name: Not on file  . Number of children: 2  . Years of education: college  . Highest education level: Not on file  Occupational History  . Not on file  Social Needs  . Financial resource strain: Not on file  . Food insecurity:    Worry: Not on file    Inability: Not on file  . Transportation needs:    Medical: Not on file    Non-medical: Not on file  Tobacco Use  . Smoking status: Never Smoker  . Smokeless tobacco: Never Used  Substance and Sexual Activity  . Alcohol use: No    Alcohol/week: 0.0 standard drinks  . Drug use: Yes  . Sexual activity: Not on file  Lifestyle  . Physical activity:    Days per week: Not on file    Minutes per session: Not on file  . Stress: Not on file  Relationships  . Social connections:    Talks on phone: Not on file    Gets together: Not on  file    Attends religious service: Not on file    Active member of club or organization: Not on file    Attends meetings of clubs or organizations: Not on file    Relationship status: Not on file  . Intimate partner violence:    Fear of current or ex partner: Not on file    Emotionally abused: Not on file    Physically abused: Not on file    Forced sexual activity: Not on file  Other Topics Concern  . Not on file  Social History Narrative   Patient is married and lives at home with her husband.     Outpatient Medications Prior to Visit  Medication Sig Dispense Refill  . albuterol (PROVENTIL HFA;VENTOLIN HFA) 108 (90 Base) MCG/ACT inhaler INHALE 2 PUFFS INTO THE LUNGS EVERY 6 HOURS AS NEEDED FOR WHEEZING OR SHORTNESS OF BREATH 18 g 2  . amLODipine (NORVASC) 5 MG tablet TAKE 1 TABLET BY MOUTH DAILY 90 tablet 0  . atorvastatin (LIPITOR) 20 MG tablet Take 1 tablet  (20 mg total) by mouth daily. 90 tablet 3  . azelastine (ASTELIN) 0.1 % nasal spray Place 2 sprays into both nostrils 2 (two) times daily. Use in each nostril as directed 30 mL 12  . benzonatate (TESSALON) 100 MG capsule Take 1 capsule (100 mg total) by mouth 2 (two) times daily as needed for cough. 20 capsule 0  . cetirizine (ZYRTEC) 10 MG tablet TAKE 1 TABLET(10 MG) BY MOUTH DAILY 30 tablet 5  . triamterene-hydrochlorothiazide (MAXZIDE) 75-50 MG tablet Take 1 tablet by mouth daily. 30 tablet 2  . fluconazole (DIFLUCAN) 150 MG tablet One tablet once daily, as needed, for vaginal itch (Patient not taking: Reported on 12/30/2018) 2 tablet 0  . predniSONE (DELTASONE) 5 MG tablet Take one tablet two times daily for 5 days (Patient not taking: Reported on 12/30/2018) 10 tablet 0  . sulfamethoxazole-trimethoprim (BACTRIM DS,SEPTRA DS) 800-160 MG tablet Take 1 tablet by mouth 2 (two) times daily. (Patient not taking: Reported on 12/30/2018) 20 tablet 0   No facility-administered medications prior to visit.     Allergies  Allergen Reactions  . Hydrocodone-Acetaminophen Other (See Comments)    Tachycardia    Review of Systems  Constitutional: Positive for malaise/fatigue. Negative for chills and fever.  HENT: Positive for congestion, sinus pain and sore throat. Negative for hearing loss.        Ear popping  Eyes: Positive for discharge.  Respiratory: Positive for cough, sputum production and shortness of breath.        Clear   Cardiovascular: Negative for chest pain, palpitations and leg swelling.  Gastrointestinal: Negative.   Genitourinary: Negative.   Musculoskeletal: Negative.   Neurological: Negative for dizziness and headaches.  Psychiatric/Behavioral: Negative.   All other systems reviewed and are negative.      Objective:    Physical Exam  Constitutional: She is oriented to person, place, and time. She appears well-developed and well-nourished.  HENT:  Head: Normocephalic.  Right  Ear: Hearing, tympanic membrane, external ear and ear canal normal.  Left Ear: Hearing, tympanic membrane, external ear and ear canal normal.  Nose: Mucosal edema and rhinorrhea present. Right sinus exhibits maxillary sinus tenderness. Right sinus exhibits no frontal sinus tenderness. Left sinus exhibits maxillary sinus tenderness. Left sinus exhibits no frontal sinus tenderness.  Mouth/Throat: Uvula is midline. Posterior oropharyngeal erythema present. No oropharyngeal exudate or posterior oropharyngeal edema.  Very mild maxillary tenderness on palpation  Eyes: Pupils are  equal, round, and reactive to light. Conjunctivae and lids are normal. Right eye exhibits no discharge. Left eye exhibits no discharge.  Neck: Normal range of motion. Neck supple. No thyromegaly present.  Cardiovascular: Normal rate and regular rhythm.  Pulmonary/Chest: Effort normal and breath sounds normal. No respiratory distress. She has no wheezes. She has no rhonchi. She has no rales. Chest wall is not dull to percussion. She exhibits no mass and no tenderness.  Lymphadenopathy:    She has no cervical adenopathy.  Neurological: She is alert and oriented to person, place, and time.  Skin: Skin is warm and dry.  Psychiatric: She has a normal mood and affect. Her behavior is normal. Judgment and thought content normal.    BP 130/90   Pulse 90   Temp 98.9 F (37.2 C) (Oral)   Resp 14   Ht 5\' 2"  (1.575 m)   Wt 236 lb 1.9 oz (107.1 kg)   SpO2 97%   BMI 43.19 kg/m  Wt Readings from Last 3 Encounters:  12/30/18 236 lb 1.9 oz (107.1 kg)  11/10/18 237 lb 1.9 oz (107.6 kg)  05/26/18 239 lb 1.9 oz (108.5 kg)    Health Maintenance Due  Topic Date Due  . PAP SMEAR-Modifier  01/10/2019     Lab Results  Component Value Date   TSH 1.10 05/22/2018   Lab Results  Component Value Date   WBC 7.1 05/22/2018   HGB 13.9 05/22/2018   HCT 41.8 05/22/2018   MCV 84.8 05/22/2018   PLT 204 05/22/2018   Lab Results    Component Value Date   NA 138 05/22/2018   K 3.8 05/22/2018   CO2 31 05/22/2018   GLUCOSE 94 05/22/2018   BUN 18 05/22/2018   CREATININE 1.17 (H) 05/22/2018   BILITOT 0.5 05/22/2018   ALKPHOS 75 11/11/2016   AST 11 05/22/2018   ALT 15 05/22/2018   PROT 6.8 05/22/2018   ALBUMIN 3.8 11/11/2016   CALCIUM 9.5 05/22/2018   ANIONGAP 12 08/23/2014   Lab Results  Component Value Date   CHOL 167 05/22/2018   Lab Results  Component Value Date   HDL 36 (L) 05/22/2018   Lab Results  Component Value Date   LDLCALC 115 (H) 05/22/2018   Lab Results  Component Value Date   TRIG 67 05/22/2018   Lab Results  Component Value Date   CHOLHDL 4.6 05/22/2018   Lab Results  Component Value Date   HGBA1C 6.0 (H) 05/22/2018       Assessment & Plan:   1. Environmental and seasonal allergies Signs and symptoms today are questionable with more related to allergy in nature versus infection.  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  Lungs are clear on examination mild tenderness in the maxillary sinuses lots of mucosal drainage and redness pharyngeal area is cobblestoned red most likely all this is related to allergy flare and postnasal drip flaring up a cough.  Will prescribe new nasal spray and change from Zyrtec to Xyzal to see if that can help manage control of this.  Advised to continue to use albuterol inhaler as needed.  But to work on weaning off using it and not to be as active with thinking that she needs it but only to use it as she needs it.  Provided education on the changes in her medications.   - Azelastine & Fluticasone 137 & 50 MCG/ACT THPK; Place 50  mcg into the nose 2 (two) times daily.  Dispense: 1 each; Refill: 1 - levocetirizine (XYZAL) 5 MG tablet; Take 1 tablet (5 mg total) by mouth every evening.  Dispense: 30 tablet; Refill: 1  2. Mild intermittent reactive airway disease without complication Has had  bronchitis in the past.  Questioning whether or not she has intermittent reactive airway disease without complication, secondary to allergies.  Should she not be able to stop using the albuterol inhaler secondary to the above allergy medication changes, will be needing to bring her back in to assess for asthma.  Educated on  this possibility. Provided education on the use of humidifier to help breathing at home.  Follow-up: As needed if symptoms do not improve.   Perlie Mayo, NP

## 2018-12-30 NOTE — Patient Instructions (Signed)
    Thank you for coming into the office today. I appreciate the opportunity to provide you with the care for your health and wellness.  Today you were seen for allergies  Return to work note 12/31/2018-not contagious.   Please start your new nasal spray and allergy medication today.  As suggested for symptom management use Nasal saline spray to help ease congestion and dry nasal passages. Can be used throughout the day as needed. Avoid forceful blowing of nose. It will be common to have some blood if blowing nose a lot or if nose is very dry.  Can you a humidifier at home as well. Remember to wash and air it out regularly.  Can consider use of a Neti-pot to wash out sinus cavity (twice daily). Please be aware that you need to use distilled or boiled water for these.  If you have a sore throat warm fluids like hot tea or lemon water can help sooth this.   Please hydrate, rest, get fresh air daily and wash your hands well.  I hope you feel better soon.  It was a pleasure to see you and I look forward to continuing to work together on your health and well-being. Please do not hesitate to call the office if you need care or have questions about your care.  Have a wonderful day and week.  With Gratitude,  Cherly Beach, DNP, AGNP-BC

## 2019-01-22 ENCOUNTER — Encounter: Payer: Self-pay | Admitting: Family Medicine

## 2019-01-22 ENCOUNTER — Other Ambulatory Visit: Payer: Self-pay

## 2019-01-22 ENCOUNTER — Telehealth: Payer: Self-pay

## 2019-01-22 ENCOUNTER — Ambulatory Visit (INDEPENDENT_AMBULATORY_CARE_PROVIDER_SITE_OTHER): Payer: BC Managed Care – PPO | Admitting: Family Medicine

## 2019-01-22 DIAGNOSIS — R05 Cough: Secondary | ICD-10-CM

## 2019-01-22 DIAGNOSIS — R059 Cough, unspecified: Secondary | ICD-10-CM

## 2019-01-22 DIAGNOSIS — J4520 Mild intermittent asthma, uncomplicated: Secondary | ICD-10-CM | POA: Diagnosis not present

## 2019-01-22 MED ORDER — ALBUTEROL SULFATE HFA 108 (90 BASE) MCG/ACT IN AERS: 1.0000 | INHALATION_SPRAY | RESPIRATORY_TRACT | 2 refills | Status: DC | PRN

## 2019-01-22 MED ORDER — DEXTROMETHORPHAN HBR 15 MG/5ML PO SYRP: 10.0000 mL | ORAL_SOLUTION | Freq: Four times a day (QID) | ORAL | 0 refills | Status: DC | PRN

## 2019-01-22 MED ORDER — BECLOMETHASONE DIPROP HFA 40 MCG/ACT IN AERB: 1.0000 | INHALATION_SPRAY | Freq: Two times a day (BID) | RESPIRATORY_TRACT | 1 refills | Status: DC

## 2019-01-22 NOTE — Telephone Encounter (Signed)
Has a cough, has not been around anyone that has been sick or diagnosed with the Coronavirus that she is aware of, works in [employer] and the last time she went to [employer] was 01/18/19 but lives in [place], no fevers, has been practicing social distancing.

## 2019-01-22 NOTE — Patient Instructions (Signed)
Thank you for coming into the office today. I appreciate the opportunity to provide you with the care for your health and wellness. Today we discussed: cough, allergies, and asthma  Please pick up your medications from pharmacy.  Follow up with [place] if you are not better or worsen.  Allergies can cause a lot of symptoms: watery, itching eyes, runny nose (clear), sneezing, sinus pressure, and headaches. This is not making you contagious to others. You can not spread to others or catch this from others. These symptoms happen after you have been exposed to something that you are allergic to an allergen. Prevention: The best prevention is to avoid the things that you know you are allergic to, for example smoke (cigarette, cigar, wood); pollens and molds; animal dander; dust mites. And indoor inhalants such as cleaning products or aerosol sprays.  Target your bedroom as allergy free by removing carpets, damp mopping floors weekly, hanging washable curtains instead of blinds, removing books and stuffed animals, using foam pillows, and encasing pillows and mattress in plastic. Do not blow your nose too frequently or too hard. It may cause your eardrum to perforate (tear). Blow through both nostrils at the same time to equalize pressure.  Use tissue when you blow your nose. Dispose of them and then wash your hands. If no tissue is available, do the "elbow sneeze" into the bend of your arm (away from your open hands). Always wash your hands.  If able use the [facility] in the house and car to reduce exposure to pollens. Use an air filtration system in your house or buy a small one for your bedroom. Dust your house often, using a cloth and cleaner or polish that keeps the dust from flying into the air. Allergy testing can be done if you have had allergies for a long time and are not doing well on current treatments. Take your medications as directed. If you find the medications are not working let your healthcare  provider know. It might take more than one medication to control allergies, especially, seasonal ones.     Asthma Attack Prevention, Adult Although you may not be able to control the fact that you have asthma, you can take actions to prevent episodes of asthma (asthma attacks). These actions include:  Creating a written plan for managing and treating your asthma attacks (asthma action plan).  Monitoring your asthma.  Avoiding things that can irritate your airways or make your asthma symptoms worse (asthma triggers).  Taking your medicines as directed.  Acting quickly if you have signs or symptoms of an asthma attack. What are some ways to prevent an asthma attack? Create a plan Work with your health care provider to create an asthma action plan. This plan should include:  A list of your asthma triggers and how to avoid them.  A list of symptoms that you experience during an asthma attack.  Information about when to take medicine and how much medicine to take.  Information to help you understand your peak flow measurements.  Contact information for your health care providers.  Daily actions that you can take to control asthma. Monitor your asthma To monitor your asthma:  Use your peak flow meter every morning and every evening for 2-3 weeks. Record the results in a journal. A drop in your peak flow numbers on one or more days may mean that you are starting to have an asthma attack, even if you are not having symptoms.  When you have  asthma symptoms, write them down in a journal.  Avoid asthma triggers Work with your health care provider to find out what your asthma triggers are. This can be done by:  Being tested for allergies.  Keeping a journal that notes when asthma attacks occur and what may have contributed to them.  Asking your health care provider whether other medical conditions make your asthma worse. Common asthma triggers include:  Dust.  Smoke. This  includes campfire smoke and secondhand smoke from tobacco products.  Pet dander.  Trees, grasses or pollens.  Very cold, dry, or humid air.  Mold.  Foods that contain high amounts of sulfites.  Strong smells.  Engine exhaust and air pollution.  Aerosol sprays and fumes from household cleaners.  Household pests and their droppings, including dust mites and cockroaches.  Certain medicines, including NSAIDs. Once you have determined your asthma triggers, take steps to avoid them. Depending on your triggers, you may be able to reduce the chance of an asthma attack by:  Keeping your home clean. Have someone dust and vacuum your home for you 1 or 2 times a week. If possible, have them use a high-efficiency particulate arrestance (HEPA) vacuum.  Washing your sheets weekly in hot water.  Using allergy-proof mattress covers and casings on your bed.  Keeping pets out of your home.  Taking care of mold and water problems in your home.  Avoiding areas where people smoke.  Avoiding using strong perfumes or odor sprays.  Avoid spending a lot of time outdoors when pollen counts are high and on very windy days.  Talking with your health care provider before stopping or starting any new medicines. Medicines Take over-the-counter and prescription medicines only as told by your health care provider. Many asthma attacks can be prevented by carefully following your medicine schedule. Taking your medicines correctly is especially important when you cannot avoid certain asthma triggers. Even if you are doing well, do not stop taking your medicine and do not take less medicine. Act quickly If an asthma attack happens, acting quickly can decrease how severe it is and how long it lasts. Take these actions:  Pay attention to your symptoms. If you are coughing, wheezing, or having difficulty breathing, do not wait to see if your symptoms go away on their own. Follow your asthma action plan.  If  you have followed your asthma action plan and your symptoms are not improving, call your health care provider or seek immediate medical care at the nearest hospital. It is important to write down how often you need to use your fast-acting rescue inhaler. You can track how often you use an inhaler in your journal. If you are using your rescue inhaler more often, it may mean that your asthma is not under control. Adjusting your asthma treatment plan may help you to prevent future asthma attacks and help you to gain better control of your condition. How can I prevent an asthma attack when I exercise? Exercise is a common asthma trigger. To prevent asthma attacks during exercise:  Follow advice from your health care provider about whether you should use your fast-acting inhaler before exercising. Many people with asthma experience exercise-induced bronchoconstriction (EIB). This condition often worsens during vigorous exercise in cold, humid, or dry environments. Usually, people with EIB can stay very active by using a fast-acting inhaler before exercising.  Avoid exercising outdoors in very cold or humid weather.  Avoid exercising outdoors when pollen counts are high.  Warm up and cool  down when exercising.  Stop exercising right away if asthma symptoms start. Consider taking part in exercises that are less likely to cause asthma symptoms such as:  Indoor swimming.  Biking.  Walking.  Hiking.  Playing football. This information is not intended to replace advice given to you by your health care provider. Make sure you discuss any questions you have with your health care provider. Document Released: 10/02/2009 Document Revised: 06/14/2016 Document Reviewed: 03/30/2016 Elsevier Interactive Patient Education  2019 [place] YOUR HANDS WELL AND FREQUENTLY. AVOID TOUCHING YOUR FACE, UNLESS YOUR HANDS ARE FRESHLY WASHED.  GET FRESH AIR DAILY. STAY HYDRATED WITH WATER.   It was  a pleasure to see you and I look forward to continuing to work together on your health and well-being. Please do not hesitate to call the office if you need care or have questions about your care.  Have a wonderful day and week.  With Gratitude,  Cherly Beach, DNP, AGNP-BC

## 2019-01-22 NOTE — Progress Notes (Signed)
History was provided by the patient.    HPI:  [name] is a 43 y.o. female who presents for a telemedicine visit today secondary to having ongoing cough and some chest discomfort.  Onset was earlier this week maybe 3 to 4 days ago started around Monday night.  She has had bronchitis and lots of upper respiratory infections the past.,  Feels like it might be coming back on at this time.  She feels like asthma or bronchitis is the main issue that is going on right now little bit of a weight on her chest will hard to breathe.  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  So she has been to [facility] recently.   Denies having any fever, nasal symptoms, headache, facial pain, chewing pain.  Denies having any recent contacts and/or knowing anybody that is recently sick.  Reports that she has used warm honey water and inhaler and these have relieved her symptoms for a while.  Thinks that it is worse in the morning and at night.  But is relieved with the use of her inhaler.  Currently her inhaler is only written for every 6 hours.  She reports sometimes when she is having to talk for long periods of time it makes it worse.  She reports that she does have some wheezing that takes place at night.  But it along with her shortness of breath and chest discomfort are alleviated with the use of the inhaler.  Was seen at the beginning of March by myself.  Was prescribed Xyzal and Flonase spray to help with allergy-like symptoms.  She reports that these have improved a lot of her symptoms.  Her cough was better but then is started to come back on.  Does report having a sore throat, possibly related to nasal drainage.  Denies having any allergens predominantly around her at this time.   Overall she feels like she is just got a little bit of asthma or bronchitis that is increased.  Does not feel sick or have any other signs and symptoms of having an  infection.  Does not feel that she has been exposed to the COVID-19 when asked questions.   Past Medical, Surgical, Social History, Allergies, and Medications have been Reviewed.   Review of Systems  Constitutional: Negative for chills and fever.  HENT: Positive for sore throat. Negative for congestion, ear discharge, ear pain and sinus pain.   Eyes: Negative for pain and discharge.  Respiratory: Positive for cough, shortness of breath and wheezing.   Cardiovascular: Negative for chest pain, palpitations and leg swelling.  Gastrointestinal: Negative.   Genitourinary: Negative.   Musculoskeletal: Negative.   Skin: Negative.   Neurological: Negative.  Negative for dizziness and headaches.  Endo/Heme/Allergies: Negative.      Patient Active Problem List   Diagnosis Date Noted  . Osteoarthrosis of knee 12/29/2016  . Seasonal allergies 07/19/2015  . Prediabetes 10/16/2013  . Metabolic syndrome X 68/09/7516  . Vitamin D deficiency 10/27/2012  . Acute bronchitis 11/02/2009  . Acute sinusitis 08/30/2009  . MORBID OBESITY 07/31/2008  . Hyperlipemia 04/20/2008  . Essential hypertension 04/20/2008    Current Outpatient Medications on File Prior to Visit  Medication Sig Dispense Refill  . albuterol (PROVENTIL HFA;VENTOLIN HFA) 108 (90 Base) MCG/ACT inhaler INHALE 2 PUFFS INTO THE LUNGS EVERY 6 HOURS AS NEEDED FOR WHEEZING OR SHORTNESS OF BREATH 18 g 2  . amLODipine (  NORVASC) 5 MG tablet TAKE 1 TABLET BY MOUTH DAILY 90 tablet 0  . atorvastatin (LIPITOR) 20 MG tablet Take 1 tablet (20 mg total) by mouth daily. 90 tablet 3  . Azelastine & Fluticasone 137 & 50 MCG/ACT THPK Place 50 mcg into the nose 2 (two) times daily. 1 each 1  . azelastine (ASTELIN) 0.1 % nasal spray Place 2 sprays into both nostrils 2 (two) times daily. Use in each nostril as directed 30 mL 12  . benzonatate (TESSALON) 100 MG capsule Take 1 capsule (100 mg total) by mouth 2 (two) times daily as needed for cough. 20  capsule 0  . cetirizine (ZYRTEC) 10 MG tablet TAKE 1 TABLET(10 MG) BY MOUTH DAILY 30 tablet 5  . levocetirizine (XYZAL) 5 MG tablet Take 1 tablet (5 mg total) by mouth every evening. 30 tablet 1  . triamterene-hydrochlorothiazide (MAXZIDE) 75-50 MG tablet Take 1 tablet by mouth daily. 30 tablet 2   No current facility-administered medications on file prior to visit.     Allergies  Allergen Reactions  . Hydrocodone-Acetaminophen Other (See Comments)    Tachycardia    Physical Exam:   telemedicine visit via phone   Assessment/Plan: 1. Mild intermittent reactive airway disease without complication Appears to having some mild intermittent reactive airway disease without complication.  She could have cough variant asthma.  She reports that her inhaler is helping her but she needs to be able to use a little bit more frequently throughout the day.  Will change her inhaler build to use every 4 hours as needed.  Also will start her on Qvar if she feels that the that and all is not getting what she needs.  Possibly uncontrolled asthma at this time secondary to having allergic triggers during allergy season. Educated her on the signs and symptoms that would cause her to have to go to the emergency room over the weekend these included, but were not limited to: elevated fever, shortness of breath, cough, chest pain, chest tightness that was unrelieved by inhaler use or worsening even with inhaler use. When available would like to get spirometry on her to assess her current level of asthma involvement. Additionally educated on her maintaining her Xyzal and Flonase use during this timeframe.  Reviewed side effects, risks and benefits of medication.    Patient acknowledged agreement and understanding of the plan.    - albuterol (PROVENTIL HFA;VENTOLIN HFA) 108 (90 Base) MCG/ACT inhaler; Inhale 1-2 puffs into the lungs every 4 (four) hours as needed for wheezing or shortness of breath.  Dispense: 18 g;  Refill: 2 - beclomethasone (QVAR REDIHALER) 40 MCG/ACT inhaler; Inhale 1 puff into the lungs 2 (two) times daily. Rinse mouth after each use  Dispense: 1 Inhaler; Refill: 1  2. Cough Cough is 1 of her worst symptoms.  Has provided with the inhaler as above we will also provide her with cough syrup.  Advised that she can get this over-the-counter as well.  Make sure that she is not taking any decongestion secondary to having pressure.  Reviewed side effects, risks and benefits of medication.   Patient acknowledged agreement and understanding of the plan.    - dextromethorphan 15 MG/5ML syrup; Take 10 mLs (30 mg total) by mouth 4 (four) times daily as needed for cough.  Dispense: 120 mL; Refill: 0  Follow-up: As needed  Perlie Mayo, DNP, AGNP-BC [place] [place], [place] [address] Office Hours: Mon-Thurs  8 am-5 pm; Fri 8 am-12 pm Office Phone:  [phone]  Office Fax: [phone]

## 2019-02-26 ENCOUNTER — Ambulatory Visit (INDEPENDENT_AMBULATORY_CARE_PROVIDER_SITE_OTHER): Payer: BC Managed Care – PPO | Admitting: Family Medicine

## 2019-02-26 ENCOUNTER — Encounter: Payer: Self-pay | Admitting: Family Medicine

## 2019-02-26 ENCOUNTER — Other Ambulatory Visit: Payer: Self-pay | Admitting: Family Medicine

## 2019-02-26 ENCOUNTER — Other Ambulatory Visit: Payer: Self-pay

## 2019-02-26 VITALS — BP 138/86 | HR 86 | Temp 98.3°F | Resp 14 | Ht 63.0 in | Wt 242.0 lb

## 2019-02-26 DIAGNOSIS — I10 Essential (primary) hypertension: Secondary | ICD-10-CM | POA: Diagnosis not present

## 2019-02-26 DIAGNOSIS — E559 Vitamin D deficiency, unspecified: Secondary | ICD-10-CM

## 2019-02-26 DIAGNOSIS — R7303 Prediabetes: Secondary | ICD-10-CM

## 2019-02-26 DIAGNOSIS — L989 Disorder of the skin and subcutaneous tissue, unspecified: Secondary | ICD-10-CM

## 2019-02-26 DIAGNOSIS — E785 Hyperlipidemia, unspecified: Secondary | ICD-10-CM

## 2019-02-26 DIAGNOSIS — J4520 Mild intermittent asthma, uncomplicated: Secondary | ICD-10-CM

## 2019-02-26 DIAGNOSIS — J302 Other seasonal allergic rhinitis: Secondary | ICD-10-CM

## 2019-02-26 NOTE — Patient Instructions (Signed)
Thank you for coming into the office today. I appreciate the opportunity to provide you with the care for your health and wellness. Today we discussed: Overall health and wellness.  So glad that you are feeling better.  Glad that the inhalers and the new allergy medicines have made a big difference for you.  Continue to use these as needed/directed.  We will have you come back in the end of July.  Please get your labs 1 week prior to your appointment.  During the next few months if you could work on diet/lifestyle changes to help with weight loss, reduction in prediabetes, better blood pressure control, overall health.  That would be most beneficial for you at this time.    Look forward to seeing you in the coming months.  Please stay safe please stay healthy.  Please continue to practice social distancing during this time.  To keep our community and you safe   [place] YOUR HANDS WELL AND FREQUENTLY. AVOID TOUCHING YOUR FACE, UNLESS YOUR HANDS ARE FRESHLY WASHED.  GET FRESH AIR DAILY. STAY HYDRATED WITH WATER.   It was a pleasure to see you and I look forward to continuing to work together on your health and well-being. Please do not hesitate to call the office if you need care or have questions about your care.  Have a wonderful day and week. With Gratitude, Cherly Beach, DNP, AGNP-BC   Calorie Counting for Weight Loss Calories are units of energy. Your body needs a certain amount of calories from food to keep you going throughout the day. When you eat more calories than your body needs, your body stores the extra calories as fat. When you eat fewer calories than your body needs, your body burns fat to get the energy it needs. Calorie counting means keeping track of how many calories you eat and drink each day. Calorie counting can be helpful if you need to lose weight. If you make sure to eat fewer calories than your body needs, you should lose weight. Ask your health care provider  what a healthy weight is for you. For calorie counting to work, you will need to eat the right number of calories in a day in order to lose a healthy amount of weight per week. A dietitian can help you determine how many calories you need in a day and will give you suggestions on how to reach your calorie goal.  A healthy amount of weight to lose per week is usually 1-2 lb (0.5-0.9 kg). This usually means that your daily calorie intake should be reduced by 500-750 calories.  Eating 1,200 - 1,500 calories per day can help most women lose weight.  Eating 1,500 - 1,800 calories per day can help most men lose weight. In order to meet your daily calorie goal, you will need to:  Find out how many calories are in each food you would like to eat. Try to do this before you eat.  Decide how much of the food you plan to eat.  Write down what you ate and how many calories it had. Doing this is called keeping a food log. To successfully lose weight, it is important to balance calorie counting with a healthy lifestyle that includes regular activity. Aim for 150 minutes of moderate exercise (such as walking) or 75 minutes of vigorous exercise (such as running) each week. Where do I find calorie information?  The number of calories in a food can be found on a  Nutrition Facts label. If a food does not have a Nutrition Facts label, try to look up the calories online or ask your dietitian for help. Remember that calories are listed per serving. If you choose to have more than one serving of a food, you will have to multiply the calories per serving by the amount of servings you plan to eat. For example, the label on a package of bread might say that a serving size is 1 slice and that there are 90 calories in a serving. If you eat 1 slice, you will have eaten 90 calories. If you eat 2 slices, you will have eaten 180 calories. How do I keep a food log? Immediately after each meal, record the following information in  your food log:  What you ate. Don't forget to include toppings, sauces, and other extras on the food.  How much you ate. This can be measured in cups, ounces, or number of items.  How many calories each food and drink had.  The total number of calories in the meal. Keep your food log near you, such as in a small notebook in your pocket, or use a mobile app or website. Some programs will calculate calories for you and show you how many calories you have left for the day to meet your goal. What are some calorie counting tips?   Use your calories on foods and drinks that will fill you up and not leave you hungry: ? Some examples of foods that fill you up are nuts and nut butters, vegetables, lean proteins, and high-fiber foods like whole grains. High-fiber foods are foods with more than 5 g fiber per serving. ? Drinks such as sodas, specialty coffee drinks, alcohol, and juices have a lot of calories, yet do not fill you up.  Eat nutritious foods and avoid empty calories. Empty calories are calories you get from foods or beverages that do not have many vitamins or protein, such as candy, sweets, and soda. It is better to have a nutritious high-calorie food (such as an avocado) than a food with few nutrients (such as a bag of chips).  Know how many calories are in the foods you eat most often. This will help you calculate calorie counts faster.  Pay attention to calories in drinks. Low-calorie drinks include water and unsweetened drinks.  Pay attention to nutrition labels for "low fat" or "fat free" foods. These foods sometimes have the same amount of calories or more calories than the full fat versions. They also often have added sugar, starch, or salt, to make up for flavor that was removed with the fat.  Find a way of tracking calories that works for you. Get creative. Try different apps or programs if writing down calories does not work for you. What are some portion control tips?  Know  how many calories are in a serving. This will help you know how many servings of a certain food you can have.  Use a measuring cup to measure serving sizes. You could also try weighing out portions on a kitchen scale. With time, you will be able to estimate serving sizes for some foods.  Take some time to put servings of different foods on your favorite plates, bowls, and cups so you know what a serving looks like.  Try not to eat straight from a bag or box. Doing this can lead to overeating. Put the amount you would like to eat in a cup or on a plate to  make sure you are eating the right portion.  Use smaller plates, glasses, and bowls to prevent overeating.  Try not to multitask (for example, watch TV or use your computer) while eating. If it is time to eat, sit down at a table and enjoy your food. This will help you to know when you are full. It will also help you to be aware of what you are eating and how much you are eating. What are tips for following this plan? Reading food labels  Check the calorie count compared to the serving size. The serving size may be smaller than what you are used to eating.  Check the source of the calories. Make sure the food you are eating is high in vitamins and protein and low in saturated and trans fats. Shopping  Read nutrition labels while you shop. This will help you make healthy decisions before you decide to purchase your food.  Make a grocery list and stick to it. Cooking  Try to cook your favorite foods in a healthier way. For example, try baking instead of frying.  Use low-fat dairy products. Meal planning  Use more fruits and vegetables. Half of your plate should be fruits and vegetables.  Include lean proteins like poultry and fish. How do I count calories when eating out?  Ask for smaller portion sizes.  Consider sharing an entree and sides instead of getting your own entree.  If you get your own entree, eat only half. Ask for a  box at the beginning of your meal and put the rest of your entree in it so you are not tempted to eat it.  If calories are listed on the menu, choose the lower calorie options.  Choose dishes that include vegetables, fruits, whole grains, low-fat dairy products, and lean protein.  Choose items that are boiled, broiled, grilled, or steamed. Stay away from items that are buttered, battered, fried, or served with cream sauce. Items labeled "crispy" are usually fried, unless stated otherwise.  Choose water, low-fat milk, unsweetened iced tea, or other drinks without added sugar. If you want an alcoholic beverage, choose a lower calorie option such as a glass of wine or light beer.  Ask for dressings, sauces, and syrups on the side. These are usually high in calories, so you should limit the amount you eat.  If you want a salad, choose a garden salad and ask for grilled meats. Avoid extra toppings like bacon, cheese, or fried items. Ask for the dressing on the side, or ask for olive oil and vinegar or lemon to use as dressing.  Estimate how many servings of a food you are given. For example, a serving of cooked rice is  cup or about the size of half a baseball. Knowing serving sizes will help you be aware of how much food you are eating at restaurants. The list below tells you how big or small some common portion sizes are based on everyday objects: ? 1 oz-4 stacked dice. ? 3 oz-1 deck of cards. ? 1 tsp-1 die. ? 1 Tbsp- a ping-pong ball. ? 2 Tbsp-1 ping-pong ball. ?  cup- baseball. ? 1 cup-1 baseball. Summary  Calorie counting means keeping track of how many calories you eat and drink each day. If you eat fewer calories than your body needs, you should lose weight.  A healthy amount of weight to lose per week is usually 1-2 lb (0.5-0.9 kg). This usually means reducing your daily calorie intake by 500-750 calories.  The number of calories in a food can be found on a Nutrition Facts label.  If a food does not have a Nutrition Facts label, try to look up the calories online or ask your dietitian for help.  Use your calories on foods and drinks that will fill you up, and not on foods and drinks that will leave you hungry.  Use smaller plates, glasses, and bowls to prevent overeating. This information is not intended to replace advice given to you by your health care provider. Make sure you discuss any questions you have with your health care provider. Document Released: 10/14/2005 Document Revised: 07/03/2018 Document Reviewed: 09/13/2016 Elsevier Interactive Patient Education  2019 [employer].

## 2019-02-26 NOTE — Progress Notes (Signed)
Subjective:     Patient ID: [name], female   DOB: November 03, 1975, 43 y.o.   MRN: [id number]  [name] presents for Allergies (follow up) History provided by the patient.  43 year old female patient who presents today for office visit secondary to having follow-up for allergies and reactive airway disease.  Previously seen twice in March secondary to having allergy and asthma-like symptoms.  Was started on Xyzal, Flonase with Astelin combo, albuterol was changed to every 4 hours, started on Qvar.  Overall reports that she is doing much better can breathe well and does not have an allergy symptoms.  Reports she is now started using the Qvar as needed.  Has only used her albuterol, rescue inhaler once to twice a week.  Which is a great improvement from several times a day.  Overall she has only 1 concern today in the office.  She had a small questionable bump/skin tag come up on the left side of the base of her neck into her shoulder.  She has been applying Neosporin to it because it was sore and hurting her.  It is no longer sore or hurting her.  This popped up about 2 weeks ago.  It has not ruptured or open or drained pus.  Though she did feel like it was a blister at one time.  Denies having any injury, and or bug bite that she is aware of.  She just woke up with it 1 day.  Would like to look at having it removed if possible in the future.  If it does not go away.  Past Medical, Surgical, Social History, Allergies, and Medications have been Reviewed    Past Medical History:  Diagnosis Date  . Allergic rhinitis   . Hyperlipidemia   . Hypertension   . Lumbar disc disease   . Obesity   . Syncope and collapse 09/2014   associated with over corrected htn, had cardiac and neurologic eval in 2015   Past Surgical History:  Procedure Laterality Date  . APPENDECTOMY    . CHOLECYSTECTOMY    . LAPAROSCOPIC GASTRIC BANDING  August 18, 2010   Social History   Socioeconomic  History  . Marital status: Married    Spouse name: Not on file  . Number of children: 2  . Years of education: college  . Highest education level: Not on file  Occupational History  . Not on file  Social Needs  . Financial resource strain: Not on file  . Food insecurity:    Worry: Not on file    Inability: Not on file  . Transportation needs:    Medical: Not on file    Non-medical: Not on file  Tobacco Use  . Smoking status: Never Smoker  . Smokeless tobacco: Never Used  Substance and Sexual Activity  . Alcohol use: No    Alcohol/week: 0.0 standard drinks  . Drug use: Yes  . Sexual activity: Not on file  Lifestyle  . Physical activity:    Days per week: Not on file    Minutes per session: Not on file  . Stress: Not on file  Relationships  . Social connections:    Talks on phone: Not on file    Gets together: Not on file    Attends religious service: Not on file    Active member of club or organization: Not on file    Attends meetings of clubs or organizations: Not on file    Relationship status:  Not on file  . Intimate partner violence:    Fear of current or ex partner: Not on file    Emotionally abused: Not on file    Physically abused: Not on file    Forced sexual activity: Not on file  Other Topics Concern  . Not on file  Social History Narrative   Patient is married and lives at home with her husband.     Outpatient Encounter Medications as of 02/26/2019  Medication Sig  . albuterol (PROVENTIL HFA;VENTOLIN HFA) 108 (90 Base) MCG/ACT inhaler Inhale 1-2 puffs into the lungs every 4 (four) hours as needed for wheezing or shortness of breath.  Marland Kitchen amLODipine (NORVASC) 5 MG tablet TAKE 1 TABLET BY MOUTH DAILY  . atorvastatin (LIPITOR) 20 MG tablet Take 1 tablet (20 mg total) by mouth daily.  . Azelastine & Fluticasone 137 & 50 MCG/ACT THPK Place 50 mcg into the nose 2 (two) times daily.  Marland Kitchen azelastine (ASTELIN) 0.1 % nasal spray Place 2 sprays into both nostrils 2  (two) times daily. Use in each nostril as directed  . beclomethasone (QVAR REDIHALER) 40 MCG/ACT inhaler Inhale 1 puff into the lungs 2 (two) times daily. Rinse mouth after each use  . benzonatate (TESSALON) 100 MG capsule Take 1 capsule (100 mg total) by mouth 2 (two) times daily as needed for cough.  . cetirizine (ZYRTEC) 10 MG tablet TAKE 1 TABLET(10 MG) BY MOUTH DAILY  . dextromethorphan 15 MG/5ML syrup Take 10 mLs (30 mg total) by mouth 4 (four) times daily as needed for cough.  . levocetirizine (XYZAL) 5 MG tablet Take 1 tablet (5 mg total) by mouth every evening.  . triamterene-hydrochlorothiazide (MAXZIDE) 75-50 MG tablet Take 1 tablet by mouth daily.   No facility-administered encounter medications on file as of 02/26/2019.    Allergies  Allergen Reactions  . Hydrocodone-Acetaminophen Other (See Comments)    Tachycardia    Review of Systems  Constitutional: Negative for chills and fever.  HENT: Negative.   Eyes: Negative.   Respiratory: Negative for cough, chest tightness, shortness of breath and wheezing.   Cardiovascular: Negative for chest pain, palpitations and leg swelling.  Gastrointestinal: Negative.   Endocrine: Negative.   Genitourinary: Negative.   Musculoskeletal: Negative.   Skin:       Small bump on Left side of neck at base   Allergic/Immunologic: Negative.   Neurological: Negative for dizziness and headaches.  Hematological: Negative.   Psychiatric/Behavioral: Negative.   All other systems reviewed and are negative.      Objective:     BP 138/86   Pulse 86   Temp 98.3 F (36.8 C) (Oral)   Resp 14   Ht 5\' 3"  (1.6 m)   Wt 242 lb (109.8 kg)   SpO2 97%   BMI 42.87 kg/m   Physical Exam Vitals signs and nursing note reviewed.  Constitutional:      Appearance: Normal appearance. She is obese.  HENT:     Head: Normocephalic and atraumatic.     Right Ear: External ear normal.     Left Ear: External ear normal.     Nose: Nose normal.  Eyes:      General: No scleral icterus.       Right eye: No discharge.        Left eye: No discharge.     Conjunctiva/sclera: Conjunctivae normal.  Neck:     Musculoskeletal: Normal range of motion.  Cardiovascular:     Rate and Rhythm: Normal  rate and regular rhythm.     Pulses: Normal pulses.     Heart sounds: Normal heart sounds.  Pulmonary:     Effort: Pulmonary effort is normal.     Breath sounds: Normal breath sounds.  Abdominal:     General: Bowel sounds are normal.     Palpations: Abdomen is soft.  Musculoskeletal: Normal range of motion.  Skin:    General: Skin is warm and dry.     Findings: Lesion present.     Comments: Small 3/4 mm bump, white and flesh colored. Non vesicular or pustular in nature. Semi firm, harder than skin tag.   Neurological:     General: No focal deficit present.     Mental Status: She is alert and oriented to person, place, and time. Mental status is at baseline.  Psychiatric:        Mood and Affect: Mood normal.        Behavior: Behavior normal.        Thought Content: Thought content normal.        Judgment: Judgment normal.        Assessment and Plan       1. Essential hypertension Controlled, no change at this time.  Advised to start working out more.  As she has put on more weight and this will drive her blood pressure up.  She is higher today in the office than she has been in the past.  Discussed DASHdiet and commitment to daily physical exercise for at least 30 minutes.  Advised to continue medications at this time.  Will assess labs at next visit.  Provided her with a diet and exercise plan.  - CBC with Differential/Platelet - COMPLETE METABOLIC PANEL WITH GFR  2. Prediabetes She is educated on the importance of maintaining weight and carbohydrate intake secondary to being in the prediabetic range.  If she could eliminate some weight and watch her diet and start with physical activity she could eliminate the development of diabetes  altogether.  This is been stressed to her in the past.  And was re-stressed to her today.  Will check A1c at next visit.  As she is going to try to implement some lifestyle changes.  - Hemoglobin A1c  3. MORBID OBESITY Deteriorated, she is put on 6 pounds in the last month.  She was reeducated about the importance of being committed to exercise and food choices being healthy.  Educated to get 150 minutes at a minimum per week of exercise.  This can be divided up as she can get it in.  Re-stressed the importance of healthy food choices and portion control.  She has been encouraged to start a food diary in the past.  Count calories to consider joining a food group as well. Additional information was provided to her today for education and review.  - Lipid panel - Hemoglobin A1c - TSH  4. Hyperlipidemia LDL goal <100 We will be assessing this at her next appointment.  Previously has elevated LDL.  Would like this to be under 100.  If she were to be diagnosed with diabetes would like this to be under 70.  Possible need for statin.   Provided education about proper foods to help eliminate extra cholesterol and fats in the diet.  - Lipid panel  5. Vitamin D deficiency Previously low levels of vitamin D.  We will be assessing this to make sure that she does not need to go on vitamin D supplementation.  As vitamin D can impact energy levels.  - VITAMIN D 25 Hydroxy (Vit-D Deficiency, Fractures)  6. Seasonal allergies Controlled, continue taking Xyzal, Flonase with Astelin.  7. Mild intermittent reactive airway disease without complication Controlled continue with albuterol as needed, Qvar as needed.  8. Lesion of neck New, appears to be resolving as pain is subsided.  Will see if body will heal and reabsorb.  If not possibly could to removal in office.  Will assess at next visit or if it gives trouble beforehand.  Return in about 3 months (around 05/17/2019) for labs 1 wk before appt appt with.   Perlie Mayo, DNP, AGNP-BC [place], [place] [address], [place] [address] Office Hours: Mon-Thurs 8 am-5 pm; Fri 8 am-12 pm Office Phone:  [phone]  Office Fax: [phone]

## 2019-03-08 ENCOUNTER — Ambulatory Visit: Payer: Self-pay | Admitting: Family Medicine

## 2019-03-15 ENCOUNTER — Other Ambulatory Visit: Payer: Self-pay

## 2019-03-15 MED ORDER — AMLODIPINE BESYLATE 5 MG PO TABS: 5.0000 mg | ORAL_TABLET | Freq: Every day | ORAL | 0 refills | Status: DC

## 2019-03-30 ENCOUNTER — Other Ambulatory Visit: Payer: Self-pay

## 2019-03-30 DIAGNOSIS — J3089 Other allergic rhinitis: Secondary | ICD-10-CM

## 2019-03-30 MED ORDER — LEVOCETIRIZINE DIHYDROCHLORIDE 5 MG PO TABS: 5.0000 mg | ORAL_TABLET | Freq: Every evening | ORAL | 1 refills | Status: DC

## 2019-04-05 ENCOUNTER — Other Ambulatory Visit: Payer: Self-pay

## 2019-04-05 MED ORDER — FLUTICASONE PROPIONATE 50 MCG/ACT NA SUSP: 1.0000 | Freq: Two times a day (BID) | NASAL | 0 refills | Status: DC

## 2019-04-07 ENCOUNTER — Other Ambulatory Visit: Payer: Self-pay

## 2019-04-07 MED ORDER — FLUTICASONE PROPIONATE 50 MCG/ACT NA SUSP: 1.0000 | Freq: Two times a day (BID) | NASAL | 0 refills | Status: DC

## 2019-04-13 ENCOUNTER — Other Ambulatory Visit: Payer: Self-pay | Admitting: Family Medicine

## 2019-04-29 ENCOUNTER — Other Ambulatory Visit: Payer: Self-pay | Admitting: Family Medicine

## 2019-05-12 LAB — CBC WITH DIFFERENTIAL/PLATELET
Absolute Monocytes: 502 cells/uL (ref 200–950)
Basophils Absolute: 44 cells/uL (ref 0–200)
Basophils Relative: 0.5 %
Eosinophils Absolute: 150 cells/uL (ref 15–500)
Eosinophils Relative: 1.7 %
HCT: 43.1 % (ref 35.0–45.0)
Hemoglobin: 14.3 g/dL (ref 11.7–15.5)
Lymphs Abs: 3353 cells/uL (ref 850–3900)
MCH: 29.2 pg (ref 27.0–33.0)
MCHC: 33.2 g/dL (ref 32.0–36.0)
MCV: 88 fL (ref 80.0–100.0)
MPV: 13.3 fL — ABNORMAL HIGH (ref 7.5–12.5)
Monocytes Relative: 5.7 %
Neutro Abs: 4752 cells/uL (ref 1500–7800)
Neutrophils Relative %: 54 %
Platelets: 208 10*3/uL (ref 140–400)
RBC: 4.9 10*6/uL (ref 3.80–5.10)
RDW: 12.8 % (ref 11.0–15.0)
Total Lymphocyte: 38.1 %
WBC: 8.8 10*3/uL (ref 3.8–10.8)

## 2019-05-12 LAB — COMPLETE METABOLIC PANEL WITH GFR
AG Ratio: 1.3 (calc) (ref 1.0–2.5)
ALT: 15 U/L (ref 6–29)
AST: 12 U/L (ref 10–30)
Albumin: 3.9 g/dL (ref 3.6–5.1)
Alkaline phosphatase (APISO): 83 U/L (ref 31–125)
BUN/Creatinine Ratio: 15 (calc) (ref 6–22)
BUN: 20 mg/dL (ref 7–25)
CO2: 30 mmol/L (ref 20–32)
Calcium: 9.8 mg/dL (ref 8.6–10.2)
Chloride: 101 mmol/L (ref 98–110)
Creat: 1.35 mg/dL — ABNORMAL HIGH (ref 0.50–1.10)
GFR, Est African American: 56 mL/min/{1.73_m2} — ABNORMAL LOW (ref >=60)
GFR, Est Non African American: 48 mL/min/{1.73_m2} — ABNORMAL LOW (ref >=60)
Globulin: 2.9 g/dL (calc) (ref 1.9–3.7)
Glucose, Bld: 88 mg/dL (ref 65–99)
Potassium: 3.6 mmol/L (ref 3.5–5.3)
Sodium: 138 mmol/L (ref 135–146)
Total Bilirubin: 0.5 mg/dL (ref 0.2–1.2)
Total Protein: 6.8 g/dL (ref 6.1–8.1)

## 2019-05-12 LAB — TSH: TSH: 1.65 mIU/L

## 2019-05-12 LAB — LIPID PANEL
Cholesterol: 185 mg/dL (ref <200)
HDL: 40 mg/dL — ABNORMAL LOW (ref >=50)
LDL Cholesterol (Calc): 124 mg/dL (calc) — ABNORMAL HIGH
Non-HDL Cholesterol (Calc): 145 mg/dL (calc) — ABNORMAL HIGH (ref <130)
Total CHOL/HDL Ratio: 4.6 (calc) (ref <5.0)
Triglycerides: 99 mg/dL (ref <150)

## 2019-05-12 LAB — HEMOGLOBIN A1C
Hgb A1c MFr Bld: 5.9 % of total Hgb — ABNORMAL HIGH (ref <5.7)
Mean Plasma Glucose: 123 (calc)
eAG (mmol/L): 6.8 (calc)

## 2019-05-12 LAB — VITAMIN D 25 HYDROXY (VIT D DEFICIENCY, FRACTURES): Vit D, 25-Hydroxy: 28 ng/mL — ABNORMAL LOW (ref 30–100)

## 2019-05-12 NOTE — Progress Notes (Signed)
Liver function is good. Blood levels are good. Kidney has worsened since last labs. Need to review medications as well as monitor BP better. Maintain a DASH diet. Cholesterol (LDL/BAD) has worsen, need to avoid fried, fatty, and processed foods. A1c (DM) lab is stable. Maintain a low sugar and carb diet. Vitamin D is slightly improved. Make sure you are getting calcium and vitamin D in diet Thyroid stable.   Please remind of next appt with Dr Moshe Cipro

## 2019-05-17 ENCOUNTER — Other Ambulatory Visit: Payer: Self-pay

## 2019-05-17 ENCOUNTER — Encounter: Payer: Self-pay | Admitting: Family Medicine

## 2019-05-17 ENCOUNTER — Ambulatory Visit (INDEPENDENT_AMBULATORY_CARE_PROVIDER_SITE_OTHER): Payer: BC Managed Care – PPO | Admitting: Family Medicine

## 2019-05-17 VITALS — BP 114/80 | HR 84 | Temp 97.6°F | Resp 15 | Ht 63.0 in | Wt 244.0 lb

## 2019-05-17 DIAGNOSIS — E7800 Pure hypercholesterolemia, unspecified: Secondary | ICD-10-CM

## 2019-05-17 DIAGNOSIS — R7303 Prediabetes: Secondary | ICD-10-CM

## 2019-05-17 DIAGNOSIS — I10 Essential (primary) hypertension: Secondary | ICD-10-CM | POA: Diagnosis not present

## 2019-05-17 MED ORDER — ATORVASTATIN CALCIUM 40 MG PO TABS: 40.0000 mg | ORAL_TABLET | Freq: Every day | ORAL | 3 refills | Status: DC

## 2019-05-17 NOTE — Patient Instructions (Addendum)
F/U in 4.5 months, call if you need me before  Increase in atorvastatin dose to 40 mg daily (OK to take two 20 mg tabs together daily til done)   Blood pressure is excellent  Reduce fried and fatty foods, and please increase fruit and vegetable    Think about what you will eat, plan ahead. Choose " clean, green, fresh or frozen" over canned, processed or packaged foods which are more sugary, salty and fatty. 70 to 75% of food eaten should be vegetables and fruit. Three meals at set times with snacks allowed between meals, but they must be fruit or vegetables. Aim to eat over a 12 hour period , example 7 am to 7 pm, and STOP after  your last meal of the day. Drink water,generally about 64 ounces per day, no other drink is as healthy. Fruit juice is best enjoyed in a healthy way, by EATING the fruit. Call / send message if excess cough or shortness of breath recurs or abdominal pain and bloating and change in BM become an  Issue  Reduce fiber to one daily'   Fasting lipid, cmp and EGFr   Take OTC vit D3  2000 iU once daily 2 pound per  month weight loss goal  Thanks for choosing [facility], we consider it a privelige to serve you.

## 2019-05-21 ENCOUNTER — Ambulatory Visit: Payer: BC Managed Care – PPO | Admitting: Family Medicine

## 2019-05-25 NOTE — Assessment & Plan Note (Signed)
Controlled, no change in medication DASH diet and commitment to daily physical activity for a minimum of 30 minutes discussed and encouraged, as a part of hypertension management. The importance of attaining a healthy weight is also discussed.  BP/Weight 05/17/2019 02/26/2019 12/30/2018 11/10/2018 05/26/2018 09/30/2017 12/09/9415  Systolic BP 408 144 818 563 149 702 637  Diastolic BP 80 86 90 82 68 84 57  Wt. (Lbs) 244 242 236.12 237.12 239.12 241 -  BMI 43.22 42.87 43.19 42 42.36 42.69 -

## 2019-05-25 NOTE — Assessment & Plan Note (Signed)
Patient educated about the importance of limiting  Carbohydrate intake , the need to commit to daily physical activity for a minimum of 30 minutes , and to commit weight loss. The fact that changes in all these areas will reduce or eliminate all together the development of diabetes is stressed.  Improved, which is good  Diabetic Labs Latest Ref Rng & Units 05/11/2019 05/22/2018 09/29/2017 11/11/2016 11/02/2015  HbA1c <5.7 % of total Hgb 5.9(H) 6.0(H) 5.9(H) 5.8(H) 6.1(H)  Chol <200 mg/dL 185 167 239(H) 143 155  HDL > OR = 50 mg/dL 40(L) 36(L) 40(L) 40(L) 43(L)  Calc LDL mg/dL (calc) 124(H) 115(H) 182(H) 91 100  Triglycerides <150 mg/dL 99 67 71 61 58  Creatinine 0.50 - 1.10 mg/dL 1.35(H) 1.17(H) 1.31(H) 1.26(H) 1.16(H)   BP/Weight 05/17/2019 02/26/2019 12/30/2018 11/10/2018 05/26/2018 09/30/2017 01/08/9701  Systolic BP 637 858 850 277 412 878 676  Diastolic BP 80 86 90 82 68 84 57  Wt. (Lbs) 244 242 236.12 237.12 239.12 241 -  BMI 43.22 42.87 43.19 42 42.36 42.69 -   No flowsheet data found.

## 2019-05-25 NOTE — Assessment & Plan Note (Addendum)
Hyperlipidemia:Low fat diet discussed and encouraged. Dose increase in lipitor  Lipid Panel  Lab Results  Component Value Date   CHOL 185 05/11/2019   HDL 40 (L) 05/11/2019   LDLCALC 124 (H) 05/11/2019   TRIG 99 05/11/2019   CHOLHDL 4.6 05/11/2019   Not at goal, needs to reduce fat intake Updated lab needed at/ before next visit.

## 2019-05-25 NOTE — Assessment & Plan Note (Signed)
  Patient re-educated about  the importance of commitment to a  minimum of 150 minutes of exercise per week as able.  The importance of healthy food choices with portion control discussed, as well as eating regularly and within a 12 hour window most days. The need to choose "clean , green" food 50 to 75% of the time is discussed, as well as to make water the primary drink and set a goal of 64 ounces water daily.    Weight /BMI 05/17/2019 02/26/2019 12/30/2018  WEIGHT 244 lb 242 lb 236 lb 1.9 oz  HEIGHT 5\' 3"  5\' 3"  5\' 2"   BMI 43.22 kg/m2 42.87 kg/m2 43.19 kg/m2   \

## 2019-05-25 NOTE — Progress Notes (Signed)
[name]     MRN: [id number]      DOB: 1976-09-06   HPI [name] is here for follow up and re-evaluation of chronic medical conditions, medication management and review of any available recent lab and radiology data.  Preventive health is updated, specifically  Cancer screening and Immunization.   Questions or concerns regarding consultations or procedures which the PT has had in the interim are  addressed. The PT denies any adverse reactions to current medications since the last visit.  There are no new concerns.  There are no specific complaints   ROS Denies recent fever or chills. Denies sinus pressure, nasal congestion, ear pain or sore throat. Denies chest congestion, productive cough or wheezing. Denies chest pains, palpitations and leg swelling Denies abdominal pain, nausea, vomiting,diarrhea or constipation.   Denies dysuria, frequency, hesitancy or incontinence. Denies joint pain, swelling and limitation in mobility. Denies headaches, seizures, numbness, or tingling. Denies depression, anxiety or insomnia. Denies skin break down or rash.   PE  BP 114/80   Pulse 84   Temp 97.6 F (36.4 C) (Temporal)   Resp 15   Ht 5\' 3"  (1.6 m)   Wt 244 lb (110.7 kg)   SpO2 98%   BMI 43.22 kg/m   Patient alert and oriented and in no cardiopulmonary distress.  HEENT: No facial asymmetry, EOMI,   oropharynx pink and moist.  Neck supple no JVD, no mass.  Chest: Clear to auscultation bilaterally.  CVS: S1, S2 no murmurs, no S3.Regular rate.  ABD: Soft non tender.   Ext: No edema  MS: Adequate ROM spine, shoulders, hips and knees.  Skin: Intact, no ulcerations or rash noted.  Psych: Good eye contact, normal affect. Memory intact not anxious or depressed appearing.  CNS: CN 2-12 intact, power,  normal throughout.no focal deficits noted.   Assessment & Plan  Essential hypertension Controlled, no change in medication DASH diet and commitment to daily  physical activity for a minimum of 30 minutes discussed and encouraged, as a part of hypertension management. The importance of attaining a healthy weight is also discussed.  BP/Weight 05/17/2019 02/26/2019 12/30/2018 11/10/2018 05/26/2018 09/30/2017 3/87/5643  Systolic BP 329 518 841 660 630 160 109  Diastolic BP 80 86 90 82 68 84 57  Wt. (Lbs) 244 242 236.12 237.12 239.12 241 -  BMI 43.22 42.87 43.19 42 42.36 42.69 -       MORBID OBESITY  Patient re-educated about  the importance of commitment to a  minimum of 150 minutes of exercise per week as able.  The importance of healthy food choices with portion control discussed, as well as eating regularly and within a 12 hour window most days. The need to choose "clean , green" food 50 to 75% of the time is discussed, as well as to make water the primary drink and set a goal of 64 ounces water daily.    Weight /BMI 05/17/2019 02/26/2019 12/30/2018  WEIGHT 244 lb 242 lb 236 lb 1.9 oz  HEIGHT 5\' 3"  5\' 3"  5\' 2"   BMI 43.22 kg/m2 42.87 kg/m2 43.19 kg/m2   \   Hyperlipemia Hyperlipidemia:Low fat diet discussed and encouraged. Dose increase in lipitor  Lipid Panel  Lab Results  Component Value Date   CHOL 185 05/11/2019   HDL 40 (L) 05/11/2019   LDLCALC 124 (H) 05/11/2019   TRIG 99 05/11/2019   CHOLHDL 4.6 05/11/2019   Not at goal, needs to reduce fat intake Updated lab needed at/  before next visit.     Prediabetes Patient educated about the importance of limiting  Carbohydrate intake , the need to commit to daily physical activity for a minimum of 30 minutes , and to commit weight loss. The fact that changes in all these areas will reduce or eliminate all together the development of diabetes is stressed.  Improved, which is good  Diabetic Labs Latest Ref Rng & Units 05/11/2019 05/22/2018 09/29/2017 11/11/2016 11/02/2015  HbA1c <5.7 % of total Hgb 5.9(H) 6.0(H) 5.9(H) 5.8(H) 6.1(H)  Chol <200 mg/dL 185 167 239(H) 143 155  HDL > OR = 50  mg/dL 40(L) 36(L) 40(L) 40(L) 43(L)  Calc LDL mg/dL (calc) 124(H) 115(H) 182(H) 91 100  Triglycerides <150 mg/dL 99 67 71 61 58  Creatinine 0.50 - 1.10 mg/dL 1.35(H) 1.17(H) 1.31(H) 1.26(H) 1.16(H)   BP/Weight 05/17/2019 02/26/2019 12/30/2018 11/10/2018 05/26/2018 09/30/2017 2/49/3241  Systolic BP 991 444 584 835 075 732 256  Diastolic BP 80 86 90 82 68 84 57  Wt. (Lbs) 244 242 236.12 237.12 239.12 241 -  BMI 43.22 42.87 43.19 42 42.36 42.69 -   No flowsheet data found.

## 2019-05-30 ENCOUNTER — Other Ambulatory Visit: Payer: Self-pay | Admitting: Family Medicine

## 2019-06-09 ENCOUNTER — Other Ambulatory Visit: Payer: Self-pay | Admitting: Family Medicine

## 2019-06-10 ENCOUNTER — Telehealth: Payer: Self-pay | Admitting: Family Medicine

## 2019-06-10 NOTE — Telephone Encounter (Signed)
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  I asked her when she needed this by and she stated Monday. I told her that I could not make any promises.

## 2019-06-14 NOTE — Telephone Encounter (Signed)
Patients letter printed and ready to be collected

## 2019-06-14 NOTE — Telephone Encounter (Signed)
pls type a letter stating that due to medical conditions, patient is at increased risk for complications from Covid 19 should she be infected. Thanks

## 2019-06-15 ENCOUNTER — Other Ambulatory Visit: Payer: Self-pay

## 2019-06-15 ENCOUNTER — Other Ambulatory Visit: Payer: Self-pay | Admitting: Family Medicine

## 2019-06-15 DIAGNOSIS — J3089 Other allergic rhinitis: Secondary | ICD-10-CM

## 2019-06-15 MED ORDER — LEVOCETIRIZINE DIHYDROCHLORIDE 5 MG PO TABS: 5.0000 mg | ORAL_TABLET | Freq: Every evening | ORAL | 1 refills | Status: DC

## 2019-06-25 ENCOUNTER — Other Ambulatory Visit: Payer: Self-pay | Admitting: Family Medicine

## 2019-06-25 DIAGNOSIS — J3089 Other allergic rhinitis: Secondary | ICD-10-CM

## 2019-07-08 ENCOUNTER — Other Ambulatory Visit: Payer: Self-pay

## 2019-07-08 MED ORDER — FLUTICASONE PROPIONATE 50 MCG/ACT NA SUSP: 1.0000 | Freq: Two times a day (BID) | NASAL | 0 refills | Status: DC

## 2019-07-26 ENCOUNTER — Ambulatory Visit (INDEPENDENT_AMBULATORY_CARE_PROVIDER_SITE_OTHER): Payer: BC Managed Care – PPO

## 2019-07-26 ENCOUNTER — Other Ambulatory Visit: Payer: Self-pay

## 2019-07-26 DIAGNOSIS — Z23 Encounter for immunization: Secondary | ICD-10-CM | POA: Diagnosis not present

## 2019-08-16 ENCOUNTER — Other Ambulatory Visit: Payer: Self-pay

## 2019-08-16 MED ORDER — TRIAMTERENE-HCTZ 75-50 MG PO TABS: 1.0000 | ORAL_TABLET | Freq: Every day | ORAL | 2 refills | Status: DC

## 2019-09-07 ENCOUNTER — Other Ambulatory Visit: Payer: Self-pay

## 2019-09-07 MED ORDER — AMLODIPINE BESYLATE 5 MG PO TABS: ORAL_TABLET | ORAL | 1 refills | Status: DC

## 2019-10-01 ENCOUNTER — Other Ambulatory Visit: Payer: Self-pay | Admitting: Family Medicine

## 2019-10-01 DIAGNOSIS — J3089 Other allergic rhinitis: Secondary | ICD-10-CM

## 2019-10-11 ENCOUNTER — Other Ambulatory Visit: Payer: Self-pay

## 2019-10-11 MED ORDER — FLUTICASONE PROPIONATE 50 MCG/ACT NA SUSP: 1.0000 | Freq: Two times a day (BID) | NASAL | 0 refills | Status: DC

## 2019-10-12 ENCOUNTER — Ambulatory Visit: Payer: BC Managed Care – PPO | Admitting: Family Medicine

## 2019-10-13 ENCOUNTER — Other Ambulatory Visit: Payer: Self-pay

## 2019-10-13 ENCOUNTER — Encounter: Payer: Self-pay | Admitting: Family Medicine

## 2019-10-13 ENCOUNTER — Ambulatory Visit (INDEPENDENT_AMBULATORY_CARE_PROVIDER_SITE_OTHER): Payer: BC Managed Care – PPO | Admitting: Family Medicine

## 2019-10-13 VITALS — BP 120/82 | HR 85 | Temp 98.1°F | Resp 15 | Ht 64.0 in | Wt 242.0 lb

## 2019-10-13 DIAGNOSIS — I10 Essential (primary) hypertension: Secondary | ICD-10-CM

## 2019-10-13 DIAGNOSIS — R7301 Impaired fasting glucose: Secondary | ICD-10-CM

## 2019-10-13 DIAGNOSIS — E7849 Other hyperlipidemia: Secondary | ICD-10-CM | POA: Diagnosis not present

## 2019-10-13 DIAGNOSIS — E8881 Metabolic syndrome: Secondary | ICD-10-CM

## 2019-10-13 DIAGNOSIS — J302 Other seasonal allergic rhinitis: Secondary | ICD-10-CM

## 2019-10-13 DIAGNOSIS — R7303 Prediabetes: Secondary | ICD-10-CM

## 2019-10-13 MED ORDER — TRIAMTERENE-HCTZ 75-50 MG PO TABS: 1.0000 | ORAL_TABLET | Freq: Every day | ORAL | 3 refills | Status: DC

## 2019-10-13 NOTE — Assessment & Plan Note (Signed)
Controlled, no change in medication DASH diet and commitment to daily physical activity for a minimum of 30 minutes discussed and encouraged, as a part of hypertension management. The importance of attaining a healthy weight is also discussed.  BP/Weight 10/13/2019 05/17/2019 02/26/2019 12/30/2018 11/10/2018 05/26/2018 0000000  Systolic BP 123456 [id number] 123456  Diastolic BP 82 80 86 90 82 68 84  Wt. (Lbs) 242 244 242 236.12 237.12 239.12 241  BMI 41.54 43.22 42.87 43.19 42 42.36 42.69

## 2019-10-13 NOTE — Patient Instructions (Signed)
F/U in 6 months, call if you need me before  Please get fasting lipid, cmp and EGFr, hBA1C  Next week  It is important that you exercise regularly at least 30 minutes 5 times a week. If you develop chest pain, have severe difficulty breathing, or feel very tired, stop exercising immediately and seek medical attention    Think about what you will eat, plan ahead. Choose " clean, green, fresh or frozen" over canned, processed or packaged foods which are more sugary, salty and fatty. 70 to 75% of food eaten should be vegetables and fruit. Three meals at set times with snacks allowed between meals, but they must be fruit or vegetables. Aim to eat over a 12 hour period , example 7 am to 7 pm, and STOP after  your last meal of the day. Drink water,generally about 64 ounces per day, no other drink is as healthy. Fruit juice is best enjoyed in a healthy way, by EATING the fruit.   Thanks for choosing [facility], we consider it a privelige to serve you.

## 2019-10-17 ENCOUNTER — Encounter: Payer: Self-pay | Admitting: Family Medicine

## 2019-10-17 NOTE — Assessment & Plan Note (Signed)
  Patient re-educated about  the importance of commitment to a  minimum of 150 minutes of exercise per week as able.  The importance of healthy food choices with portion control discussed, as well as eating regularly and within a 12 hour window most days. The need to choose "clean , green" food 50 to 75% of the time is discussed, as well as to make water the primary drink and set a goal of 64 ounces water daily.    Weight /BMI 10/13/2019 05/17/2019 02/26/2019  WEIGHT 242 lb 244 lb 242 lb  HEIGHT 5\' 4"  5\' 3"  5\' 3"   BMI 41.54 kg/m2 43.22 kg/m2 42.87 kg/m2

## 2019-10-17 NOTE — Assessment & Plan Note (Signed)
Controlled, no change in medication  

## 2019-10-17 NOTE — Assessment & Plan Note (Signed)
Patient educated about the importance of limiting  Carbohydrate intake , the need to commit to daily physical activity for a minimum of 30 minutes , and to commit weight loss. The fact that changes in all these areas will reduce or eliminate all together the development of diabetes is stressed.   Diabetic Labs Latest Ref Rng & Units 05/11/2019 05/22/2018 09/29/2017 11/11/2016 11/02/2015  HbA1c <5.7 % of total Hgb 5.9(H) 6.0(H) 5.9(H) 5.8(H) 6.1(H)  Chol <200 mg/dL 185 167 239(H) 143 155  HDL > OR = 50 mg/dL 40(L) 36(L) 40(L) 40(L) 43(L)  Calc LDL mg/dL (calc) 124(H) 115(H) 182(H) 91 100  Triglycerides <150 mg/dL 99 67 71 61 58  Creatinine 0.50 - 1.10 mg/dL 1.35(H) 1.17(H) 1.31(H) 1.26(H) 1.16(H)   BP/Weight 10/13/2019 05/17/2019 02/26/2019 12/30/2018 11/10/2018 05/26/2018 0000000  Systolic BP 123456 [id number] 0000000 [id number] 123456  Diastolic BP 82 80 86 90 82 68 84  Wt. (Lbs) 242 244 242 236.12 237.12 239.12 241  BMI 41.54 43.22 42.87 43.19 42 42.36 42.69   No flowsheet data found.  Updated lab needed at/ before next visit.

## 2019-10-17 NOTE — Assessment & Plan Note (Signed)
The increased risk of cardiovascular disease associated with this diagnosis, and the need to consistently work on lifestyle to change this is discussed. Following  a  heart healthy diet ,commitment to 30 minutes of exercise at least 5 days per week, as well as control of blood sugar and cholesterol , and achieving a healthy weight are all the areas to be addressed .  

## 2019-10-17 NOTE — Progress Notes (Signed)
[name]     MRN: [id number]      DOB: Feb 06, 1976   HPI [name] is here for follow up and re-evaluation of chronic medical conditions, medication management and review of any available recent lab and radiology data.  Preventive health is updated, specifically  Cancer screening and Immunization.   Questions or concerns regarding consultations or procedures which the PT has had in the interim are  addressed. The PT denies any adverse reactions to current medications since the last visit.  There are no new concerns.  There are no specific complaints   ROS Denies recent fever or chills. Denies sinus pressure, nasal congestion, ear pain or sore throat. Denies chest congestion, productive cough or wheezing. Denies chest pains, palpitations and leg swelling Denies abdominal pain, nausea, vomiting,diarrhea or constipation.   Denies dysuria, frequency, hesitancy or incontinence. Denies joint pain, swelling and limitation in mobility. Denies headaches, seizures, numbness, or tingling. Denies depression, anxiety or insomnia. Denies skin break down or rash.   PE  BP 120/82   Pulse 85   Temp 98.1 F (36.7 C) (Temporal)   Resp 15   Ht 5\' 4"  (1.626 m)   Wt 242 lb (109.8 kg)   SpO2 97%   BMI 41.54 kg/m   Patient alert and oriented and in no cardiopulmonary distress.  HEENT: No facial asymmetry, EOMI,     Neck supple .  Chest: Clear to auscultation bilaterally.  CVS: S1, S2 no murmurs, no S3.Regular rate.  ABD: Soft non tender.   Ext: No edema  MS: Adequate ROM spine, shoulders, hips and reduced in  knees.  Skin: Intact, no ulcerations or rash noted.  Psych: Good eye contact, normal affect. Memory intact not anxious or depressed appearing.  CNS: CN 2-12 intact, power,  normal throughout.no focal deficits noted.   Assessment & Plan  Essential hypertension Controlled, no change in medication DASH diet and commitment to daily physical activity for a  minimum of 30 minutes discussed and encouraged, as a part of hypertension management. The importance of attaining a healthy weight is also discussed.  BP/Weight 10/13/2019 05/17/2019 02/26/2019 12/30/2018 11/10/2018 05/26/2018 0000000  Systolic BP 123456 [id number] 0000000 [id number] [id number]  Diastolic BP 82 80 86 90 82 68 84  Wt. (Lbs) 242 244 242 236.12 237.12 239.12 241  BMI 41.54 43.22 42.87 43.19 42 42.36 42.69       MORBID OBESITY  Patient re-educated about  the importance of commitment to a  minimum of 150 minutes of exercise per week as able.  The importance of healthy food choices with portion control discussed, as well as eating regularly and within a 12 hour window most days. The need to choose "clean , green" food 50 to 75% of the time is discussed, as well as to make water the primary drink and set a goal of 64 ounces water daily.    Weight /BMI 10/13/2019 05/17/2019 02/26/2019  WEIGHT 242 lb 244 lb 242 lb  HEIGHT 5\' 4"  5\' 3"  5\' 3"   BMI 41.54 kg/m2 43.22 kg/m2 42.87 kg/m2      Prediabetes Patient educated about the importance of limiting  Carbohydrate intake , the need to commit to daily physical activity for a minimum of 30 minutes , and to commit weight loss. The fact that changes in all these areas will reduce or eliminate all together the development of diabetes is stressed.   Diabetic Labs Latest Ref Rng & Units 05/11/2019 05/22/2018 09/29/2017 11/11/2016 11/02/2015  HbA1c <5.7 % of total Hgb 5.9(H) 6.0(H) 5.9(H) 5.8(H) 6.1(H)  Chol <200 mg/dL 185 167 239(H) 143 155  HDL > OR = 50 mg/dL 40(L) 36(L) 40(L) 40(L) 43(L)  Calc LDL mg/dL (calc) 124(H) 115(H) 182(H) 91 100  Triglycerides <150 mg/dL 99 67 71 61 58  Creatinine 0.50 - 1.10 mg/dL 1.35(H) 1.17(H) 1.31(H) 1.26(H) 1.16(H)   BP/Weight 10/13/2019 05/17/2019 02/26/2019 12/30/2018 11/10/2018 05/26/2018 0000000  Systolic BP 123456 [id number] 0000000 [id number] 123456  Diastolic BP 82 80 86 90 82 68 84  Wt. (Lbs) 242 244 242 236.12 237.12 239.12 241  BMI  41.54 43.22 42.87 43.19 42 42.36 42.69   No flowsheet data found.  Updated lab needed at/ before next visit.   Seasonal allergies Controlled, no change in medication   Hyperlipemia Hyperlipidemia:Low fat diet discussed and encouraged.   Lipid Panel  Lab Results  Component Value Date   CHOL 185 05/11/2019   HDL 40 (L) 05/11/2019   LDLCALC 124 (H) 05/11/2019   TRIG 99 05/11/2019   CHOLHDL 4.6 05/11/2019     Updated lab needed at/ before next visit. Not currently at goal  Metabolic syndrome X The increased risk of cardiovascular disease associated with this diagnosis, and the need to consistently work on lifestyle to change this is discussed. Following  a  heart healthy diet ,commitment to 30 minutes of exercise at least 5 days per week, as well as control of blood sugar and cholesterol , and achieving a healthy weight are all the areas to be addressed .

## 2019-10-17 NOTE — Assessment & Plan Note (Signed)
Hyperlipidemia:Low fat diet discussed and encouraged.   Lipid Panel  Lab Results  Component Value Date   CHOL 185 05/11/2019   HDL 40 (L) 05/11/2019   LDLCALC 124 (H) 05/11/2019   TRIG 99 05/11/2019   CHOLHDL 4.6 05/11/2019     Updated lab needed at/ before next visit. Not currently at goal

## 2019-11-20 ENCOUNTER — Other Ambulatory Visit: Payer: Self-pay | Admitting: Family Medicine

## 2019-11-27 LAB — COMPLETE METABOLIC PANEL WITH GFR
AG Ratio: 1.4 (calc) (ref 1.0–2.5)
ALT: 17 U/L (ref 6–29)
AST: 12 U/L (ref 10–30)
Albumin: 3.9 g/dL (ref 3.6–5.1)
Alkaline phosphatase (APISO): 92 U/L (ref 31–125)
BUN/Creatinine Ratio: 14 (calc) (ref 6–22)
BUN: 16 mg/dL (ref 7–25)
CO2: 32 mmol/L (ref 20–32)
Calcium: 9.8 mg/dL (ref 8.6–10.2)
Chloride: 101 mmol/L (ref 98–110)
Creat: 1.11 mg/dL — ABNORMAL HIGH (ref 0.50–1.10)
GFR, Est African American: 70 mL/min/{1.73_m2} (ref >=60)
GFR, Est Non African American: 61 mL/min/{1.73_m2} (ref >=60)
Globulin: 2.7 g/dL (calc) (ref 1.9–3.7)
Glucose, Bld: 102 mg/dL — ABNORMAL HIGH (ref 65–99)
Potassium: 3.3 mmol/L — ABNORMAL LOW (ref 3.5–5.3)
Sodium: 140 mmol/L (ref 135–146)
Total Bilirubin: 0.3 mg/dL (ref 0.2–1.2)
Total Protein: 6.6 g/dL (ref 6.1–8.1)

## 2019-11-27 LAB — LIPID PANEL
Cholesterol: 153 mg/dL (ref <200)
HDL: 39 mg/dL — ABNORMAL LOW (ref >=50)
LDL Cholesterol (Calc): 99 mg/dL (calc)
Non-HDL Cholesterol (Calc): 114 mg/dL (calc) (ref <130)
Total CHOL/HDL Ratio: 3.9 (calc) (ref <5.0)
Triglycerides: 63 mg/dL (ref <150)

## 2019-11-27 LAB — HEMOGLOBIN A1C
Hgb A1c MFr Bld: 6 % of total Hgb — ABNORMAL HIGH (ref <5.7)
Mean Plasma Glucose: 126 (calc)
eAG (mmol/L): 7 (calc)

## 2019-11-29 ENCOUNTER — Encounter: Payer: Self-pay | Admitting: Family Medicine

## 2019-12-27 ENCOUNTER — Ambulatory Visit: Payer: BC Managed Care – PPO | Attending: Internal Medicine

## 2019-12-27 DIAGNOSIS — Z23 Encounter for immunization: Secondary | ICD-10-CM

## 2019-12-27 NOTE — Progress Notes (Signed)
   Covid-19 Vaccination Clinic  Name:  [name]    MRN: [id number] DOB: [birth year]  12/27/2019  [name] was observed post Covid-19 immunization for 15 minutes without incidence. She was provided with Vaccine Information Sheet and instruction to access the V-Safe system.   [name] was instructed to call 911 with any severe reactions post vaccine: Marland Kitchen Difficulty breathing  . Swelling of your face and throat  . A fast heartbeat  . A bad rash all over your body  . Dizziness and weakness    Immunizations Administered    Name Date Dose VIS Date Route   Pfizer COVID-19 Vaccine 12/27/2019 11:11 AM 0.3 mL 10/08/2019 Intramuscular   Manufacturer: [place]   Lot: [id number]   [place]: [id number]

## 2020-01-04 ENCOUNTER — Other Ambulatory Visit: Payer: Self-pay | Admitting: Family Medicine

## 2020-01-04 DIAGNOSIS — J3089 Other allergic rhinitis: Secondary | ICD-10-CM

## 2020-01-19 ENCOUNTER — Ambulatory Visit: Payer: BC Managed Care – PPO | Attending: Internal Medicine

## 2020-01-19 DIAGNOSIS — Z23 Encounter for immunization: Secondary | ICD-10-CM

## 2020-01-19 NOTE — Progress Notes (Signed)
   Covid-19 Vaccination Clinic  Name:  [name]    MRN: [id number] DOB: [birth year]  01/19/2020  [name] was observed post Covid-19 immunization for 15 minutes without incident. She was provided with Vaccine Information Sheet and instruction to access the V-Safe system.   [name] was instructed to call 911 with any severe reactions post vaccine: Marland Kitchen Difficulty breathing  . Swelling of face and throat  . A fast heartbeat  . A bad rash all over body  . Dizziness and weakness   Immunizations Administered    Name Date Dose VIS Date Route   Pfizer COVID-19 Vaccine 01/19/2020  9:35 AM 0.3 mL 10/08/2019 Intramuscular   Manufacturer: [place]   Lot: [id number]   [place]: [id number]

## 2020-02-01 ENCOUNTER — Encounter: Payer: Self-pay | Admitting: Family Medicine

## 2020-02-01 ENCOUNTER — Other Ambulatory Visit: Payer: Self-pay

## 2020-02-01 ENCOUNTER — Telehealth (INDEPENDENT_AMBULATORY_CARE_PROVIDER_SITE_OTHER): Payer: BC Managed Care – PPO | Admitting: Family Medicine

## 2020-02-01 VITALS — BP 120/82 | Ht 62.0 in | Wt 242.0 lb

## 2020-02-01 DIAGNOSIS — L659 Nonscarring hair loss, unspecified: Secondary | ICD-10-CM

## 2020-02-01 NOTE — Patient Instructions (Signed)
I appreciate the opportunity to provide you with care for your health and wellness. Today we discussed: Hair loss   Follow up: As scheduled  No labs or referrals today  Please continue to practice social distancing to keep you, your family, and our community safe.  If you must go out, please wear a mask and practice good handwashing.  It was a pleasure to see you and I look forward to continuing to work together on your health and well-being. Please do not hesitate to call the office if you need care or have questions about your care.  Have a wonderful day and week. With Gratitude, Cherly Beach, DNP, AGNP-BC

## 2020-02-01 NOTE — Progress Notes (Signed)
Virtual Visit via Video Note   This visit type was conducted due to national recommendations for restrictions regarding the COVID-19 Pandemic (e.g. social distancing) in an effort to limit this patient's exposure and mitigate transmission in our community.  Due to her co-morbid illnesses, this patient is at least at moderate risk for complications without adequate follow up.  This format is felt to be most appropriate for this patient at this time.  All issues noted in this document were discussed and addressed.  A limited physical exam was performed with this format.    Evaluation Performed:  Follow-up visit  Date:  02/01/2020   ID:  [name], DOB 25-Dec-1975, MRN [id number]  Patient Location: Home Provider Location: Office  Location of Patient: Home Location of Provider: Telehealth Consent was obtain for visit to be over via telehealth. I verified that I am speaking with the correct person using two identifiers.  PCP:  Fayrene Helper, MD   Chief Complaint:  Hair loss  History of Present Illness:    [name] is a 44 y.o. female with history of hypertension, hyperlipidemia, obesity among others.  Presents today after having some changes in hair loss.  Reports in the last 1 to 2 months that she has been shedding a lot, when she brushes she loses large amounts of hair.  She denies having any aggravating factors that she is aware of.  Is not been relieved by anything.  She reports that she made an appointment with a hairstylist whom she saw a week ago and reports that her hair probably was falling out or breaking off secondary to her home care regimen since the pandemic.  So they have changed the care treatment of her hair and hopefully that will help and she had a hair cut to see if they can get it improved with growing back.  Secondary to hair loss that they question whether or not it was here care versus thyroid we will get updated labs and she is willing to do  this today  The patient does not have symptoms concerning for COVID-19 infection (fever, chills, cough, or new shortness of breath).   Past Medical, Surgical, Social History, Allergies, and Medications have been Reviewed.  Past Medical History:  Diagnosis Date  . Allergic rhinitis   . Hyperlipidemia   . Hypertension   . Lumbar disc disease   . Obesity   . Syncope and collapse 09/2014   associated with over corrected htn, had cardiac and neurologic eval in 2015   Past Surgical History:  Procedure Laterality Date  . APPENDECTOMY    . CHOLECYSTECTOMY    . LAPAROSCOPIC GASTRIC BANDING  August 18, 2010     Current Meds  Medication Sig  . albuterol (PROVENTIL HFA;VENTOLIN HFA) 108 (90 Base) MCG/ACT inhaler Inhale 1-2 puffs into the lungs every 4 (four) hours as needed for wheezing or shortness of breath.  Marland Kitchen amLODipine (NORVASC) 5 MG tablet TAKE 1 TABLET(5 MG) BY MOUTH DAILY  . atorvastatin (LIPITOR) 40 MG tablet Take 1 tablet (40 mg total) by mouth daily.  . beclomethasone (QVAR REDIHALER) 40 MCG/ACT inhaler Inhale 1 puff into the lungs 2 (two) times daily. Rinse mouth after each use  . fluticasone (FLONASE) 50 MCG/ACT nasal spray Place 1 spray into both nostrils 2 (two) times daily.  Marland Kitchen levocetirizine (XYZAL) 5 MG tablet TAKE 1 TABLET(5 MG) BY MOUTH EVERY EVENING  . triamterene-hydrochlorothiazide (MAXZIDE) 75-50 MG tablet TAKE 1 TABLET BY MOUTH  DAILY     Allergies:   Hydrocodone-acetaminophen   ROS:   Please see the history of present illness.    All other systems reviewed and are negative.   Labs/Other Tests and Data Reviewed:    Recent Labs: 05/11/2019: Hemoglobin 14.3; Platelets 208; TSH 1.65 11/26/2019: ALT 17; BUN 16; Creat 1.11; Potassium 3.3; Sodium 140   Recent Lipid Panel Lab Results  Component Value Date/Time   CHOL 153 11/26/2019 08:33 AM   TRIG 63 11/26/2019 08:33 AM   HDL 39 (L) 11/26/2019 08:33 AM   CHOLHDL 3.9 11/26/2019 08:33 AM   LDLCALC 99 11/26/2019  08:33 AM    Wt Readings from Last 3 Encounters:  02/01/20 242 lb (109.8 kg)  10/13/19 242 lb (109.8 kg)  05/17/19 244 lb (110.7 kg)     Objective:    Vital Signs:  BP 120/82   Ht 5\' 2"  (1.575 m)   Wt 242 lb (109.8 kg)   BMI 44.26 kg/m    VITAL SIGNS:  reviewed GEN:  no acute distress EYES:  sclerae anicteric, EOMI - Extraocular Movements Intact RESPIRATORY:  normal respiratory effort, symmetric expansion SKIN:  no rash, lesions or ulcers. MUSCULOSKELETAL:  no obvious deformities. NEURO:  alert and oriented x 3, no obvious focal deficit PSYCH:  normal affect  ASSESSMENT & PLAN:    1. Alopecia of scalp We will get updated labs to make though this is not thyroid related nature did not have any elevation in thyroid levels last year.   - TSH - COMPLETE METABOLIC PANEL WITH GFR - CBC     Time:   Today, I have spent 10 minutes with the patient with telehealth technology discussing the above problems.     Medication Adjustments/Labs and Tests Ordered: Current medicines are reviewed at length with the patient today.  Concerns regarding medicines are outlined above.   Tests Ordered: No orders of the defined types were placed in this encounter.   Medication Changes: No orders of the defined types were placed in this encounter.   Disposition:  Follow up 04/10/2020 Signed, Perlie Mayo, NP  02/01/2020 1:23 PM     [facility]

## 2020-02-16 LAB — COMPLETE METABOLIC PANEL WITH GFR
AG Ratio: 1.2 (calc) (ref 1.0–2.5)
ALT: 19 U/L (ref 6–29)
AST: 17 U/L (ref 10–30)
Albumin: 3.8 g/dL (ref 3.6–5.1)
Alkaline phosphatase (APISO): 93 U/L (ref 31–125)
BUN/Creatinine Ratio: 14 (calc) (ref 6–22)
BUN: 17 mg/dL (ref 7–25)
CO2: 32 mmol/L (ref 20–32)
Calcium: 9.9 mg/dL (ref 8.6–10.2)
Chloride: 100 mmol/L (ref 98–110)
Creat: 1.25 mg/dL — ABNORMAL HIGH (ref 0.50–1.10)
GFR, Est African American: 61 mL/min/{1.73_m2} (ref >=60)
GFR, Est Non African American: 53 mL/min/{1.73_m2} — ABNORMAL LOW (ref >=60)
Globulin: 3.2 g/dL (calc) (ref 1.9–3.7)
Glucose, Bld: 92 mg/dL (ref 65–99)
Potassium: 3.4 mmol/L — ABNORMAL LOW (ref 3.5–5.3)
Sodium: 138 mmol/L (ref 135–146)
Total Bilirubin: 0.4 mg/dL (ref 0.2–1.2)
Total Protein: 7 g/dL (ref 6.1–8.1)

## 2020-02-16 LAB — CBC
HCT: 42.8 % (ref 35.0–45.0)
Hemoglobin: 14.3 g/dL (ref 11.7–15.5)
MCH: 29.1 pg (ref 27.0–33.0)
MCHC: 33.4 g/dL (ref 32.0–36.0)
MCV: 87 fL (ref 80.0–100.0)
MPV: 13.1 fL — ABNORMAL HIGH (ref 7.5–12.5)
Platelets: 210 10*3/uL (ref 140–400)
RBC: 4.92 10*6/uL (ref 3.80–5.10)
RDW: 12.3 % (ref 11.0–15.0)
WBC: 9.8 10*3/uL (ref 3.8–10.8)

## 2020-02-16 LAB — TSH: TSH: 1.49 mIU/L

## 2020-02-16 NOTE — Progress Notes (Signed)
LVM for pt to call back.

## 2020-03-14 ENCOUNTER — Encounter: Payer: Self-pay | Admitting: Family Medicine

## 2020-03-17 ENCOUNTER — Telehealth: Payer: Self-pay

## 2020-03-17 NOTE — Telephone Encounter (Signed)
FMLA  & Short term Dis  Copied Noted Sleeved 29.00 for each form , pt is aware

## 2020-04-03 ENCOUNTER — Other Ambulatory Visit: Payer: Self-pay

## 2020-04-03 ENCOUNTER — Ambulatory Visit (INDEPENDENT_AMBULATORY_CARE_PROVIDER_SITE_OTHER): Payer: BC Managed Care – PPO | Admitting: Family Medicine

## 2020-04-03 ENCOUNTER — Encounter: Payer: Self-pay | Admitting: Family Medicine

## 2020-04-03 VITALS — BP 140/84 | HR 92 | Temp 98.3°F | Resp 15 | Ht 62.5 in | Wt 244.1 lb

## 2020-04-03 DIAGNOSIS — F4321 Adjustment disorder with depressed mood: Secondary | ICD-10-CM

## 2020-04-03 DIAGNOSIS — I10 Essential (primary) hypertension: Secondary | ICD-10-CM

## 2020-04-03 DIAGNOSIS — F4329 Adjustment disorder with other symptoms: Secondary | ICD-10-CM | POA: Diagnosis not present

## 2020-04-03 DIAGNOSIS — M25561 Pain in right knee: Secondary | ICD-10-CM

## 2020-04-03 DIAGNOSIS — R079 Chest pain, unspecified: Secondary | ICD-10-CM

## 2020-04-03 DIAGNOSIS — M1711 Unilateral primary osteoarthritis, right knee: Secondary | ICD-10-CM

## 2020-04-03 DIAGNOSIS — Z1159 Encounter for screening for other viral diseases: Secondary | ICD-10-CM

## 2020-04-03 DIAGNOSIS — E559 Vitamin D deficiency, unspecified: Secondary | ICD-10-CM

## 2020-04-03 DIAGNOSIS — R7303 Prediabetes: Secondary | ICD-10-CM

## 2020-04-03 DIAGNOSIS — E7849 Other hyperlipidemia: Secondary | ICD-10-CM

## 2020-04-03 DIAGNOSIS — R002 Palpitations: Secondary | ICD-10-CM

## 2020-04-03 HISTORY — DX: Palpitations: R00.2

## 2020-04-03 MED ORDER — METHYLPREDNISOLONE ACETATE 80 MG/ML IJ SUSP
80.0000 mg | Freq: Once | INTRAMUSCULAR | Status: AC
  Administered 2020-04-03: 80 mg via INTRAMUSCULAR

## 2020-04-03 MED ORDER — KETOROLAC TROMETHAMINE 60 MG/2ML IM SOLN
60.0000 mg | Freq: Once | INTRAMUSCULAR | Status: AC
  Administered 2020-04-03: 60 mg via INTRAMUSCULAR

## 2020-04-03 MED ORDER — PREDNISONE 10 MG PO TABS: 10.0000 mg | ORAL_TABLET | Freq: Two times a day (BID) | ORAL | 0 refills | Status: DC

## 2020-04-03 NOTE — Assessment & Plan Note (Signed)
approx 10 day history , seemed like panic during very stressful time, due to personal and f/y of increased CV risk , will refer to cardiology

## 2020-04-03 NOTE — Assessment & Plan Note (Signed)
Approximate 10 day history, associated with stress, anxiety and panic, however has increased CV risk refer for cardiology eval, past h/o syncope also

## 2020-04-03 NOTE — Assessment & Plan Note (Signed)
Patient educated about the importance of limiting  Carbohydrate intake , the need to commit to daily physical activity for a minimum of 30 minutes , and to commit weight loss. The fact that changes in all these areas will reduce or eliminate all together the development of diabetes is stressed.   Diabetic Labs Latest Ref Rng & Units 02/15/2020 11/26/2019 05/11/2019 05/22/2018 09/29/2017  HbA1c <5.7 % of total Hgb - 6.0(H) 5.9(H) 6.0(H) 5.9(H)  Chol <200 mg/dL - 153 185 167 239(H)  HDL > OR = 50 mg/dL - 39(L) 40(L) 36(L) 40(L)  Calc LDL mg/dL (calc) - 99 124(H) 115(H) 182(H)  Triglycerides <150 mg/dL - 63 99 67 71  Creatinine 0.50 - 1.10 mg/dL 1.25(H) 1.11(H) 1.35(H) 1.17(H) 1.31(H)   BP/Weight 04/03/2020 02/01/2020 10/13/2019 05/17/2019 02/26/2019 12/30/2018 02/18/5360  Systolic BP 443 154 008 676 195 093 267  Diastolic BP 84 82 82 80 86 90 82  Wt. (Lbs) 244.12 242 242 244 242 236.12 237.12  BMI 43.94 44.26 41.54 43.22 42.87 43.19 42   No flowsheet data found.  Updated lab needed at/ before next visit.

## 2020-04-03 NOTE — Assessment & Plan Note (Signed)
Sub optimal control, no med change DASH diet and commitment to daily physical activity for a minimum of 30 minutes discussed and encouraged, as a part of hypertension management. The importance of attaining a healthy weight is also discussed.  BP/Weight 04/03/2020 02/01/2020 10/13/2019 05/17/2019 02/26/2019 12/30/2018 12/11/863  Systolic BP 784 696 295 284 132 440 102  Diastolic BP 84 82 82 80 86 90 82  Wt. (Lbs) 244.12 242 242 244 242 236.12 237.12  BMI 43.94 44.26 41.54 43.22 42.87 43.19 42

## 2020-04-03 NOTE — Assessment & Plan Note (Signed)
Approximate 1 month h/o increased strss and anxiety while grieving the sudden loss of her Mom. Is now responsible for her only sibling an older brother who has never been independent, over whelmed, will benefit  form therapy, will refer.No medication management indicated currnetly

## 2020-04-03 NOTE — Assessment & Plan Note (Signed)
3 week h/o acute pain flare of right knee with reduced mobility Depo medrol in office followed by short course of prednisone, if no benefit then refer Ortho Weight loss and swimming encouraged

## 2020-04-03 NOTE — Assessment & Plan Note (Signed)
Hyperlipidemia:Low fat diet discussed and encouraged.   Lipid Panel  Lab Results  Component Value Date   CHOL 153 11/26/2019   HDL 39 (L) 11/26/2019   LDLCALC 99 11/26/2019   TRIG 63 11/26/2019   CHOLHDL 3.9 11/26/2019  controlled, re eval in 4 months, no med change

## 2020-04-03 NOTE — Progress Notes (Signed)
DANIEL JOHNDROW     MRN: [id number]      DOB: [birth year]   HPI [name] is here for follow up and re-evaluation of chronic medical conditions, medication management and review of any available recent lab and radiology data.  Preventive health is updated,  Currently grieving loss of her Mom in the past 1 month, after acute on chronic illness, multiple strokes. Stress is the responsibility of her older brother who has never been independent. C/i approx 10 day h/o palpitations and chest tightness while her mom was hospitalized, thinks this was  Panic, but reports premaire cAD in both parents, has personal h/o syncope and HTN, hyperlipidemia and PDB   ROS Denies recent fever or chills. Denies sinus pressure, nasal congestion, ear pain or sore throat. C/o chest pain, palpitations and exertional fsatigue for 10 days when Mom hospitalized, attributes to panic, but has h/o syncope, ipalpitations and reports both parents, now deceased had cAD in their 72's  Denies abdominal pain, nausea, vomiting,diarrhea or constipation.   Denies dysuria, frequency, hesitancy or incontinence. Right knee pain and reduced mobility x 3 weeks, recalls acute injury and khas pain localized to medial aspect Denies headaches, seizures, numbness, or tingling. Denies depression, anxiety or insomnia. Denies skin break down or rash.   PE  BP 140/84   Pulse 92   Temp 98.3 F (36.8 C) (Temporal)   Resp 15   Ht 5' 2.5" (1.588 m)   Wt 244 lb 1.9 oz (110.7 kg)   SpO2 98%   BMI 43.94 kg/m    Patient alert and oriented and in no cardiopulmonary distress.  HEENT: No facial asymmetry, EOMI,     Neck supple .  Chest: Clear to auscultation bilaterally.  CVS: S1, S2 no murmurs, no S3.Regular rate.  ABD: Soft non tender.   Ext: No edema  MS: Adequate ROM spine, shoulders, hips and reduced in right  knee.  Skin: Intact, no ulcerations or rash noted.  Psych: Good eye contact, normal affectMildly  tearful at times. Memory intact not anxious or depressed appearing.  CNS: CN 2-12 intact, power,  normal throughout.no focal deficits noted.   Assessment & Plan  Essential hypertension Sub optimal control, no med change DASH diet and commitment to daily physical activity for a minimum of 30 minutes discussed and encouraged, as a part of hypertension management. The importance of attaining a healthy weight is also discussed.  BP/Weight 04/03/2020 02/01/2020 10/13/2019 05/17/2019 02/26/2019 12/30/2018 9/67/8938  Systolic BP 101 751 025 852 778 242 353  Diastolic BP 84 82 82 80 86 90 82  Wt. (Lbs) 244.12 242 242 244 242 236.12 237.12  BMI 43.94 44.26 41.54 43.22 42.87 43.19 42       MORBID OBESITY  Patient re-educated about  the importance of commitment to a  minimum of 150 minutes of exercise per week as able.  The importance of healthy food choices with portion control discussed, as well as eating regularly and within a 12 hour window most days. The need to choose "clean , green" food 50 to 75% of the time is discussed, as well as to make water the primary drink and set a goal of 64 ounces water daily.    Weight /BMI 04/03/2020 02/01/2020 10/13/2019  WEIGHT 244 lb 1.9 oz 242 lb 242 lb  HEIGHT 5' 2.5" 5\' 2"  5\' 4"   BMI 43.94 kg/m2 44.26 kg/m2 41.54 kg/m2      Hyperlipemia Hyperlipidemia:Low fat diet discussed and encouraged.   Lipid Panel  Lab Results  Component Value Date   CHOL 153 11/26/2019   HDL 39 (L) 11/26/2019   LDLCALC 99 11/26/2019   TRIG 63 11/26/2019   CHOLHDL 3.9 11/26/2019  controlled, re eval in 4 months, no med change     Osteoarthrosis of knee 3 week h/o acute pain flare of right knee with reduced mobility Depo medrol in office followed by short course of prednisone, if no benefit then refer Ortho Weight loss and swimming encouraged  Prediabetes Patient educated about the importance of limiting  Carbohydrate intake , the need to commit to daily physical  activity for a minimum of 30 minutes , and to commit weight loss. The fact that changes in all these areas will reduce or eliminate all together the development of diabetes is stressed.   Diabetic Labs Latest Ref Rng & Units 02/15/2020 11/26/2019 05/11/2019 05/22/2018 09/29/2017  HbA1c <5.7 % of total Hgb - 6.0(H) 5.9(H) 6.0(H) 5.9(H)  Chol <200 mg/dL - 153 185 167 239(H)  HDL > OR = 50 mg/dL - 39(L) 40(L) 36(L) 40(L)  Calc LDL mg/dL (calc) - 99 124(H) 115(H) 182(H)  Triglycerides <150 mg/dL - 63 99 67 71  Creatinine 0.50 - 1.10 mg/dL 1.25(H) 1.11(H) 1.35(H) 1.17(H) 1.31(H)   BP/Weight 04/03/2020 02/01/2020 10/13/2019 05/17/2019 02/26/2019 12/30/2018 0/22/3361  Systolic BP 224 497 530 051 102 111 735  Diastolic BP 84 82 82 80 86 90 82  Wt. (Lbs) 244.12 242 242 244 242 236.12 237.12  BMI 43.94 44.26 41.54 43.22 42.87 43.19 42   No flowsheet data found.  Updated lab needed at/ before next visit.   Chest pain in adult Approximate 10 day history, associated with stress, anxiety and panic, however has increased CV risk refer for cardiology eval, past h/o syncope also  Intermittent palpitations approx 10 day history , seemed like panic during very stressful time, due to personal and f/y of increased CV risk , will refer to cardiology  Stress and adjustment reaction Approximate 1 month h/o increased strss and anxiety while grieving the sudden loss of her Mom. Is now responsible for her only sibling an older brother who has never been independent, over whelmed, will benefit  form therapy, will refer.No medication management indicated currnetly

## 2020-04-03 NOTE — Assessment & Plan Note (Signed)
  Patient re-educated about  the importance of commitment to a  minimum of 150 minutes of exercise per week as able.  The importance of healthy food choices with portion control discussed, as well as eating regularly and within a 12 hour window most days. The need to choose "clean , green" food 50 to 75% of the time is discussed, as well as to make water the primary drink and set a goal of 64 ounces water daily.    Weight /BMI 04/03/2020 02/01/2020 10/13/2019  WEIGHT 244 lb 1.9 oz 242 lb 242 lb  HEIGHT 5' 2.5" 5\' 2"  5\' 4"   BMI 43.94 kg/m2 44.26 kg/m2 41.54 kg/m2

## 2020-04-03 NOTE — Patient Instructions (Addendum)
F/U in office with mD in 4 months, call if you need me before  Toradol 60 mg IM and depo medrol 80 mg IM in office for right knee pain, followed by short course of prednisone, if not better in 2 weeks, send message for Ortho referral  You will be referred for Cardiology evaluation  Please work on changing food choice and improved portion control to facilitate weight loss  You will be referred to therapist  Fasting lipid, cmp amd eGFR, hBa1C and Vit D 1 and hepatitis c screening 1 week before next visit  You will be given a 1500 cal diet sheet  Swimming and water aerobics are great exercises with arthritis  Thanks for choosing [facility], we consider it a privelige to serve you.

## 2020-04-04 NOTE — Progress Notes (Signed)
CARDIOLOGY CONSULT NOTE       Patient ID: [name] MRN: [id number] DOB/AGE: Dec 27, 1975 44 y.o.  Admit date: (Not on file) Referring Physician: Moshe Taylor Primary Physician: Tamara Helper, MD Primary Cardiologist: [name] Reason for Consultation: Chest Pain/Palpitations  Active Problems:   * No active hospital problems. *   HPI:  44 y.o. referred by Dr [name] for chest pain and palpitations. She recently lost her mother and now has to care for her older brother who has never lived independently During mom's illness / hospitalization felt tightness in her chest through out the day and palpitations. Skips and rapid beats. Symptoms lasted 10 days No associated dyspnea , diaphoresis , or pre syncope She has HLD, HTN.  And premature family history of CAD  In 2015 she had "syncope" when placed on too much medication for HTN. She had negative neuro / cardiac w/u which included normal echo 10/14/14 save mild MR  and carotids 08/28/14 She also had normal monitor with no arrhythmias 10/14/14   Her brother is 34 and has some developmental challenges and previous stroke He has not lived independently  And has trouble looking for work He is still at moms old house Patient is married with two teenage kids She is  Feeling some better since primary visit but still a bit overwhelmed by circumstances   ROS All other systems reviewed and negative except as noted above  Past Medical History:  Diagnosis Date  . Allergic rhinitis   . Hyperlipidemia   . Hypertension   . Lumbar disc disease   . Obesity   . Syncope and collapse 09/2014   associated with over corrected htn, had cardiac and neurologic eval in 2015    Family History  Problem Relation Age of Onset  . Hypertension Mother   . Hyperlipidemia Mother   . Diabetes Mother   . Cancer Mother        breast   . Obesity Mother   . Hypertension Brother   . Stroke Brother   . Heart disease Father   . Heart attack Father     Social  History   Socioeconomic History  . Marital status: Married    Spouse name: Not on file  . Number of children: 2  . Years of education: college  . Highest education level: Not on file  Occupational History  . Not on file  Tobacco Use  . Smoking status: Never Smoker  . Smokeless tobacco: Never Used  Substance and Sexual Activity  . Alcohol use: No    Alcohol/week: 0.0 standard drinks  . Drug use: Yes  . Sexual activity: Not on file  Other Topics Concern  . Not on file  Social History Narrative   Patient is married and lives at home with her husband.    Social Determinants of Health   Financial Resource Strain:   . Difficulty of Paying Living Expenses:   Food Insecurity:   . Worried About Charity fundraiser in the Last Year:   . Arboriculturist in the Last Year:   Transportation Needs:   . Film/video editor (Medical):   Marland Kitchen Lack of Transportation (Non-Medical):   Physical Activity:   . Days of Exercise per Week:   . Minutes of Exercise per Session:   Stress:   . Feeling of Stress :   Social Connections:   . Frequency of Communication with Friends and Family:   . Frequency of Social Gatherings with Friends and Family:   .  Attends Religious Services:   . Active Member of Clubs or Organizations:   . Attends Archivist Meetings:   Marland Kitchen Marital Status:   Intimate Partner Violence:   . Fear of Current or Ex-Partner:   . Emotionally Abused:   Marland Kitchen Physically Abused:   . Sexually Abused:     Past Surgical History:  Procedure Laterality Date  . APPENDECTOMY    . CHOLECYSTECTOMY    . LAPAROSCOPIC GASTRIC BANDING  August 18, 2010      Current Outpatient Medications:  .  amLODipine (NORVASC) 5 MG tablet, TAKE 1 TABLET(5 MG) BY MOUTH DAILY, Disp: 90 tablet, Rfl: 1 .  atorvastatin (LIPITOR) 40 MG tablet, Take 1 tablet (40 mg total) by mouth daily., Disp: 90 tablet, Rfl: 3 .  fluticasone (FLONASE) 50 MCG/ACT nasal spray, Place 1 spray into both nostrils 2 (two)  times daily., Disp: 48 g, Rfl: 0 .  levocetirizine (XYZAL) 5 MG tablet, TAKE 1 TABLET(5 MG) BY MOUTH EVERY EVENING, Disp: 90 tablet, Rfl: 0 .  predniSONE (DELTASONE) 10 MG tablet, Take 1 tablet (10 mg total) by mouth 2 (two) times daily with a meal., Disp: 10 tablet, Rfl: 0 .  triamterene-hydrochlorothiazide (MAXZIDE) 75-50 MG tablet, TAKE 1 TABLET BY MOUTH DAILY, Disp: 30 tablet, Rfl: 3    Physical Exam: Blood pressure 130/85, pulse (!) 59, temperature (!) 96.8 F (36 C), height 5\' 2"  (1.575 m), weight 242 lb (109.8 kg), SpO2 97 %.   Affect appropriate Healthy:  appears stated age 49: normal Neck supple with no adenopathy JVP normal no bruits no thyromegaly Lungs clear with no wheezing and good diaphragmatic motion Heart:  S1/S2 no murmur, no rub, gallop or click PMI normal Abdomen: benighn, BS positve, no tenderness, no AAA no bruit.  No HSM or HJR Distal pulses intact with no bruits No edema Neuro non-focal Skin warm and dry No muscular weakness   Labs:   Lab Results  Component Value Date   WBC 9.8 02/15/2020   HGB 14.3 02/15/2020   HCT 42.8 02/15/2020   MCV 87.0 02/15/2020   PLT 210 02/15/2020   No results for input(s): NA, K, CL, CO2, BUN, CREATININE, CALCIUM, PROT, BILITOT, ALKPHOS, ALT, AST, GLUCOSE in the last 168 hours.  Invalid input(s): LABALBU Lab Results  Component Value Date   CKTOTAL 113 05/08/2007   CKMB 1.1 05/08/2007   TROPONINI 0.04        NO INDICATION OF MYOCARDIAL INJURY. 05/08/2007    Lab Results  Component Value Date   CHOL 153 11/26/2019   CHOL 185 05/11/2019   CHOL 167 05/22/2018   Lab Results  Component Value Date   HDL 39 (L) 11/26/2019   HDL 40 (L) 05/11/2019   HDL 36 (L) 05/22/2018   Lab Results  Component Value Date   LDLCALC 99 11/26/2019   LDLCALC 124 (H) 05/11/2019   LDLCALC 115 (H) 05/22/2018   Lab Results  Component Value Date   TRIG 63 11/26/2019   TRIG 99 05/11/2019   TRIG 67 05/22/2018   Lab Results    Component Value Date   CHOLHDL 3.9 11/26/2019   CHOLHDL 4.6 05/11/2019   CHOLHDL 4.6 05/22/2018   No results found for: LDLDIRECT    Radiology: No results found.  EKG:  10/14/14 SR rate 57 normal    ASSESSMENT AND PLAN:   1. Chest Pain: Atypical with CRFls premature family history , HTN , and HLD with normal ECG I think her issues revolve around the  death of her mom and [name] responsibility caring for 65 yo brother no testing needed at this point   2. Palpitations:  Benign likely related to stress with normal exam and ECG don't feel that monitor needed   3. HTN:  On norvasc 5 mg and Maxzide per Dr [name] discussed low sodium diet   4. HLD:  Continue statin labs with primary   Signed: Jenkins Rouge 04/07/2020, 10:55 AM

## 2020-04-07 ENCOUNTER — Encounter: Payer: Self-pay | Admitting: Cardiovascular Disease

## 2020-04-07 ENCOUNTER — Ambulatory Visit (INDEPENDENT_AMBULATORY_CARE_PROVIDER_SITE_OTHER): Payer: BC Managed Care – PPO | Admitting: Cardiovascular Disease

## 2020-04-07 ENCOUNTER — Other Ambulatory Visit: Payer: Self-pay

## 2020-04-07 VITALS — BP 130/85 | HR 59 | Temp 96.8°F | Ht 62.0 in | Wt 242.0 lb

## 2020-04-07 DIAGNOSIS — Z8489 Family history of other specified conditions: Secondary | ICD-10-CM

## 2020-04-07 DIAGNOSIS — I10 Essential (primary) hypertension: Secondary | ICD-10-CM | POA: Diagnosis not present

## 2020-04-07 DIAGNOSIS — E782 Mixed hyperlipidemia: Secondary | ICD-10-CM | POA: Diagnosis not present

## 2020-04-07 DIAGNOSIS — R002 Palpitations: Secondary | ICD-10-CM | POA: Diagnosis not present

## 2020-04-07 NOTE — Patient Instructions (Signed)
Medication Instructions:  Your physician recommends that you continue on your current medications as directed. Please refer to the Current Medication list given to you today.  *If you need a refill on your cardiac medications before your next appointment, please call your pharmacy*   Lab Work: NONE   If you have labs (blood work) drawn today and your tests are completely normal, you will receive your results only by: MyChart Message (if you have MyChart) OR A paper copy in the mail If you have any lab test that is abnormal or we need to change your treatment, we will call you to review the results.   Testing/Procedures: NONE    Follow-Up: At [facility], you and your health needs are our priority.  As part of our continuing mission to provide you with exceptional heart care, we have created designated Provider Care Teams.  These Care Teams include your primary Cardiologist (physician) and Advanced Practice Providers (APPs -  Physician Assistants and Nurse Practitioners) who all work together to provide you with the care you need, when you need it.  We recommend signing up for the patient portal called "MyChart".  Sign up information is provided on this After Visit Summary.  MyChart is used to connect with patients for Virtual Visits (Telemedicine).  Patients are able to view lab/test results, encounter notes, upcoming appointments, etc.  Non-urgent messages can be sent to your provider as well.   To learn more about what you can do with MyChart, go to [email or web address].    Your next appointment:    As Needed   The format for your next appointment:   In Person  Provider:   Peter Nishan, MD   Other Instructions Thank you for choosing [facility]!    

## 2020-04-07 NOTE — Telephone Encounter (Signed)
Completed and pt notified, pt will pick up forms  Copied

## 2020-04-10 ENCOUNTER — Ambulatory Visit: Payer: BC Managed Care – PPO | Admitting: Family Medicine

## 2020-04-21 ENCOUNTER — Encounter: Payer: Self-pay | Admitting: Family Medicine

## 2020-04-24 ENCOUNTER — Telehealth: Payer: Self-pay

## 2020-04-24 NOTE — Telephone Encounter (Signed)
Disability company wants to know the DX code for this claim

## 2020-04-25 NOTE — Telephone Encounter (Signed)
Stress and adjustment reaction

## 2020-04-25 NOTE — Telephone Encounter (Signed)
Are you aware of what her paperwork is for?

## 2020-04-26 ENCOUNTER — Encounter (HOSPITAL_COMMUNITY): Payer: Self-pay | Admitting: Psychiatry

## 2020-04-26 ENCOUNTER — Ambulatory Visit (INDEPENDENT_AMBULATORY_CARE_PROVIDER_SITE_OTHER): Payer: BC Managed Care – PPO | Admitting: Psychiatry

## 2020-04-26 ENCOUNTER — Other Ambulatory Visit: Payer: Self-pay

## 2020-04-26 DIAGNOSIS — F4322 Adjustment disorder with anxiety: Secondary | ICD-10-CM

## 2020-04-26 NOTE — Progress Notes (Signed)
Virtual Visit via Video Note  I connected with [name] on 04/26/20 at 11:00 AM EDT by a video enabled telemedicine application and verified that I am speaking with the correct person using two identifiers.   I discussed the limitations of evaluation and management by telemedicine and the availability of in person appointments. The patient expressed understanding and agreed to proceed.    I provided 60  minutes of non-face-to-face time during this encounter.   Tamara Smoker, LCSW     Comprehensive Clinical Assessment (CCA) Note   Location:  Patient - Home/ Provider - [place] office    04/26/2020 [name] [id number]  Visit Diagnosis:      ICD-10-CM   1. Adjustment disorder with anxious mood  F43.22      Have You Recently Had Any Thoughts About Hurting Yourself?No Are You Planning to Commit Suicide/Harm Yourself At This time? No  Have you Recently Had Thoughts About [place]? No  Patient Determined To Be At Risk for Harm To Self or Others Based on Review of Patient Reported Information or Presenting Complaint? No Method:Availability of Means:Intent: Notification Required: No Additional Information for Danger to Others Potential: Additional Comments for Danger to Others Potential: Are There Guns or Other Weapons in [place]?Types of Guns/Weapons: Are These Weapons Safely Secured?                        Who Could Verify You Are Able To Have These SecuredDo You Have any Outstanding Charges, Pending Court Dates,    CCA Biopsychosocial  Intake/Chief Complaint:  CCA Intake With Chief Complaint CCA Part Two Date: 04/26/20 CCA Part Two Time: 57 Chief Complaint/Presenting Problem: " I  think I was having panic attacks while taking care of my mother who was having strokes. This triggered memories of father's death in 2018/02/01. My mother died in 04/03/2018. I am also having anxiety about handling mother's estate and the relationship with  brother. He  is 20 years older than me and wasn't around alot when my parents were living. He came around when they became sick. My mother was trying to get him to stand up. And, now I am trying to help him and finish what my parents started". Patient's Currently Reported Symptoms/Problems: crying spells, excessive worry, Individual's Strengths: "easy to talk to, friendly, growth concept, loving, giving" Individual's Preferences: Individual therapy Individual's Abilities: teaching skills Type of Services Patient Feels Are Needed: Individual theapy/ listening ear, guidance/reassurance regarding the things I am doing, copipng skills Initial Clinical Notes/Concerns: Patient is referred for services by PCP Dr. Tula Nakayama due to patient experiencing symptoms of anxiety and depression. She denies any psychiatric hospitalizations. She reports attending two therapy sessions as part of process for lap band surgery. She reports no legal history. She denies any history of violence.  Mental Health Symptoms Depression:  Depression: Difficulty Concentrating, Sleep (too much or little), Tearfulness, Duration of symptoms greater than two weeks  Mania:  Mania: None  Anxiety:   Anxiety: Difficulty concentrating, Sleep, Worrying  Psychosis:  Psychosis: None  Trauma:  Trauma: None  Obsessions:  Obsessions: None  Compulsions:  Compulsions: None  Inattention:  Inattention: None  Hyperactivity/Impulsivity:  Hyperactivity/Impulsivity: N/A  Oppositional/Defiant Behaviors:  Oppositional/Defiant Behaviors: None  Emotional Irregularity:  Emotional Irregularity: None  Other Mood/Personality Symptoms:     Mental Status Exam Appearance and self-care  Stature:    Weight:    Clothing:  Clothing: Casual  Grooming:  Grooming: Normal  Cosmetic use:  Cosmetic Use: Age appropriate  Posture/gait:    Motor activity:    Sensorium  Attention:  Attention: Normal  Concentration:  Concentration: Normal  Orientation:   Orientation: X5  Recall/memory:  Recall/Memory: Normal  Affect and Mood  Affect:  Affect: Anxious, Tearful  Mood:  Mood: Anxious  Relating  Eye contact:    Facial expression:  Facial Expression: Responsive  Attitude toward examiner:  Attitude Toward Examiner: Cooperative  Thought and Language  Speech flow: Speech Flow: Normal  Thought content:  Thought Content: Appropriate to Mood and Circumstances  Preoccupation:  Preoccupations: Ruminations  Hallucinations:  Hallucinations: None  Organization:    Transport planner of Knowledge:  Fund of Knowledge: Average  Intelligence:  Intelligence: Average  Abstraction:  Abstraction: Normal  Judgement:  Judgement: Good  Reality Testing:  Reality Testing: Realistic  Insight:  Insight: Good  Decision Making:  Decision Making: Normal  Social Functioning  Social Maturity:  Social Maturity: Responsible  Social Judgement:  Social Judgement: Normal  Stress  Stressors:  Stressors: Family conflict, Grief/losses  Coping Ability:  Coping Ability: Materials engineer Deficits:  Skill Deficits: None  Supports:  Supports: Family, Church     Religion: Religion/Spirituality Are You A Religious Person?: Yes What is Your Religious Affiliation?: Personal assistant: Leisure / Recreation Do You Have Hobbies?: Yes Leisure and Hobbies: travel, read, work outside -Health visitor, painting  Exercise/Diet: Exercise/Diet Do You Exercise?: Yes What Type of Exercise Do You Do?: Run/Walk, Swimming (eliptical) How Many Times a Week Do You Exercise?: 4-5 times a week Have You Gained or Lost A Significant Amount of Weight in the Past Six Months?: No Do You Follow a Special Diet?: No Do You Have Any Trouble Sleeping?: Yes Explanation of Sleeping Difficulties: difficulty falling asleep when thinking about parents and all she has to do, is taking benadryl - this helps   CCA Employment/Education  Employment/Work Situation: Employment /  Work Situation Employment situation: Employed Where is patient currently employed?: [employer] long has patient been employed?: 17 years Patient's job has been impacted by current illness: Yes Describe how patient's job has been impacted: not now but the panic attacks and anxiety caused me to have to take medical leave What is the longest time patient has a held a job?: 17 years Where was the patient employed at that time?: current employer Has patient ever been in the [employer]?: No  Education: Education Did Teacher, adult education From [employer]?: Yes Did Physicist, medical?: Yes What Type of College Degree Do you Have?: BS from [employer], Did [place]?: Yes What is Your Post Graduate Degree?: Masters from [employer] What Was Your Major?: Curriculum and Instruction Did You Have Any Chief Technology Officer In School?: science, biology, [employer], Bible club, softball, flag team, yearbook , Girl Scouts Did You Have An Individualized Education Program (IIEP): No Did You Have Any Difficulty At [employer]?: No   CCA Family/Childhood History  Family and Relationship History: Family history Marital status: Married Number of Years Married: 75 What types of issues is patient dealing with in the relationship?: none - husband is very supportive Additional relationship information: Patient, husband, and their two children reside in [place], [place]. Are you sexually active?: Yes Has your sexual activity been affected by drugs, alcohol, medication, or emotional stress?: no Does patient have children?: Yes How many children?: 2 How is patient's relationship with their children?: close relationship with 15  yo son and 63 yo daughter  Childhood History:  Childhood History By whom was/is the patient raised?: Both parents Additional childhood history information: Patient was born in [place] and reared in [place], [place]. Description of patient's relationship with caregiver  when they were a child: very close to both parents, was spoiled Patient's description of current relationship with people who raised him/her: deceased How were you disciplined when you got in trouble as a child/adolescent?: didn't really get in trouble Does patient have siblings?: Yes Number of Siblings: 1 Description of patient's current relationship with siblings: close but stressful Did patient suffer any verbal/emotional/physical/sexual abuse as a child?: No Did patient suffer from severe childhood neglect?: No Has patient ever been sexually abused/assaulted/raped as an adolescent or adult?: No Was the patient ever a victim of a crime or a disaster?: No Witnessed domestic violence?: No Has patient been affected by domestic violence as an adult?: No  Child/Adolescent Assessment:     CCA Substance Use  Alcohol/Drug Use: Alcohol / Drug Use Pain Medications: See patient record Prescriptions: See patient record Over the Counter: See patient record History of alcohol / drug use?: No history of alcohol / drug abuse    ASAM's:  Six Dimensions of Multidimensional Assessment N/A  Substance use Disorder (SUD) N/A   Recommendations for Services/Supports/Treatments: Recommendations for Services/Supports/Treatments Recommendations For Services/Supports/Treatments: Individual Therapy/patient attends assessment appointment today. Confidentiality and limits are discussed. Patient agrees to return for an appointment in 1-2 weeks. Individual therapy is recommended 1 time every 1 to 2 weeks to improve coping skills to manage anxiety and adjust to recent transitions as well as loss in her life. She agrees to call this practice, call 911, or have someone take her to the ER should symptoms worsen.  DSM5 Diagnoses: Patient Active Problem List   Diagnosis Date Noted  . Chest pain in adult 04/03/2020  . Intermittent palpitations 04/03/2020  . Stress and adjustment reaction 04/03/2020  . Alopecia  of scalp 02/01/2020  . Osteoarthrosis of knee 12/29/2016  . Seasonal allergies 07/19/2015  . Prediabetes 10/16/2013  . Metabolic syndrome X 84/21/0312  . Vitamin D deficiency 10/27/2012  . MORBID OBESITY 07/31/2008  . Hyperlipemia 04/20/2008  . Essential hypertension 04/20/2008    Patient Centered Plan: Patient is on the following Treatment Plan(s): Be developed next session   Referrals to Alternative Service(s): Referred to Alternative Service(s):   Place:   Date:   Time:    Referred to Alternative Service(s):   Place:   Date:   Time:    Referred to Alternative Service(s):   Place:   Date:   Time:    Referred to Alternative Service(s):   Place:   Date:   Time:     [name]

## 2020-04-27 ENCOUNTER — Telehealth: Payer: Self-pay | Admitting: Family Medicine

## 2020-04-27 NOTE — Telephone Encounter (Signed)
[name] called from [employer] about Short term disability papers for pt.

## 2020-04-27 NOTE — Telephone Encounter (Signed)
Disability company is wanting medical records sent to them. I asked if she had signed a waiver for the records to be released and they said yes. I asked them to fax the signed release and the dates they are needing records from.

## 2020-05-02 ENCOUNTER — Telehealth: Payer: Self-pay

## 2020-05-02 NOTE — Telephone Encounter (Signed)
Pt needs a letter to RELEASE her to go back to work on Aug 2nd

## 2020-05-03 NOTE — Telephone Encounter (Signed)
Pls arrange in office visit 3rd week in July with me or klast week so she v c an be released. Needs in office documentation

## 2020-05-09 ENCOUNTER — Telehealth: Payer: Self-pay

## 2020-05-09 NOTE — Telephone Encounter (Signed)
[employer] [facility] Copied Noted  sleeved

## 2020-05-09 NOTE — Telephone Encounter (Signed)
Pt is sch for 7-22 @ 11

## 2020-05-11 ENCOUNTER — Other Ambulatory Visit: Payer: Self-pay

## 2020-05-11 ENCOUNTER — Encounter: Payer: Self-pay | Admitting: Emergency Medicine

## 2020-05-11 ENCOUNTER — Ambulatory Visit (INDEPENDENT_AMBULATORY_CARE_PROVIDER_SITE_OTHER): Payer: BC Managed Care – PPO

## 2020-05-11 ENCOUNTER — Ambulatory Visit
Admission: EM | Admit: 2020-05-11 | Discharge: 2020-05-11 | Disposition: A | Discharge status: Home / Self Care | Payer: BC Managed Care – PPO | Attending: Emergency Medicine | Admitting: Emergency Medicine

## 2020-05-11 DIAGNOSIS — M79642 Pain in left hand: Secondary | ICD-10-CM | POA: Diagnosis not present

## 2020-05-11 DIAGNOSIS — S6992XA Unspecified injury of left wrist, hand and finger(s), initial encounter: Secondary | ICD-10-CM | POA: Diagnosis not present

## 2020-05-11 DIAGNOSIS — M25532 Pain in left wrist: Secondary | ICD-10-CM

## 2020-05-11 MED ORDER — NAPROXEN 500 MG PO TABS
500.0000 mg | ORAL_TABLET | Freq: Two times a day (BID) | ORAL | 0 refills | Status: DC
Start: 2020-05-11 — End: 2020-07-27

## 2020-05-11 NOTE — Discharge Instructions (Signed)
X-rays negative for fracture or dislocation Continue conservative management of rest, ice, and elevation Continue with wrist splint Take naproxen as needed for pain relief (may cause abdominal discomfort, ulcers, and GI bleeds avoid taking with other NSAIDs) Follow up with PCP if symptoms persist Return or go to the ER if you have any new or worsening symptoms (fever, chills, chest pain, redness, swelling, bruising, deformity, etc...)
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
## 2020-05-18 ENCOUNTER — Telehealth (HOSPITAL_COMMUNITY): Payer: Self-pay | Admitting: Psychiatry

## 2020-05-18 ENCOUNTER — Other Ambulatory Visit: Payer: Self-pay

## 2020-05-18 ENCOUNTER — Ambulatory Visit (INDEPENDENT_AMBULATORY_CARE_PROVIDER_SITE_OTHER): Payer: BC Managed Care – PPO | Admitting: Family Medicine

## 2020-05-18 ENCOUNTER — Encounter: Payer: Self-pay | Admitting: Family Medicine

## 2020-05-18 VITALS — BP 118/81 | HR 74 | Resp 16 | Ht 62.0 in | Wt 245.0 lb

## 2020-05-18 DIAGNOSIS — I10 Essential (primary) hypertension: Secondary | ICD-10-CM

## 2020-05-18 DIAGNOSIS — J0100 Acute maxillary sinusitis, unspecified: Secondary | ICD-10-CM

## 2020-05-18 DIAGNOSIS — Z1159 Encounter for screening for other viral diseases: Secondary | ICD-10-CM

## 2020-05-18 DIAGNOSIS — F320 Major depressive disorder, single episode, mild: Secondary | ICD-10-CM | POA: Diagnosis not present

## 2020-05-18 DIAGNOSIS — E559 Vitamin D deficiency, unspecified: Secondary | ICD-10-CM

## 2020-05-18 DIAGNOSIS — F4329 Adjustment disorder with other symptoms: Secondary | ICD-10-CM

## 2020-05-18 DIAGNOSIS — E7800 Pure hypercholesterolemia, unspecified: Secondary | ICD-10-CM

## 2020-05-18 MED ORDER — SULFAMETHOXAZOLE-TRIMETHOPRIM 800-160 MG PO TABS
1.0000 | ORAL_TABLET | Freq: Two times a day (BID) | ORAL | 0 refills | Status: DC
Start: 2020-05-18 — End: 2020-07-27

## 2020-05-18 NOTE — Telephone Encounter (Signed)
Patient walked in advising she needed an appointment stated that she has been trying to get through to the office for 3 weeks, although voicemail is checked daily. Confirmed with CMA if voice messages had been received, they have not. While searching for appointment for patient, I offered a couple slots the patient wanted after 5 pm appointments or weekend appointments, I advised of the hours the patient said "nevermind" and walked out.
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
## 2020-05-18 NOTE — Patient Instructions (Addendum)
F/U in 4.5 month, call if you need me sooner  Septra is prescribed for your sinus infection  You will be referred to Dr Michail Sermon for therapy    [facility] get lipid, hep C screen, TSH and vit D today  Work note from 04/28 to return 05/29/2020   It is important that you exercise regularly at least 30 minutes 5 times a week. If you develop chest pain, have severe difficulty breathing, or feel very tired, stop exercising immediately and seek medical attention   Think about what you will eat, plan ahead. Choose " clean, green, fresh or frozen" over canned, processed or packaged foods which are more sugary, salty and fatty. 70 to 75% of food eaten should be vegetables and fruit. Three meals at set times with snacks allowed between meals, but they must be fruit or vegetables. Aim to eat over a 12 hour period , example 7 am to 7 pm, and STOP after  your last meal of the day. Drink water,generally about 64 ounces per day, no other drink is as healthy. Fruit juice is best enjoyed in a healthy way, by EATING the fruit.   Thanks for choosing [facility], we consider it a privelige to serve you.

## 2020-05-19 LAB — VITAMIN D 25 HYDROXY (VIT D DEFICIENCY, FRACTURES): Vit D, 25-Hydroxy: 29.9 ng/mL — ABNORMAL LOW (ref 30.0–100.0)

## 2020-05-19 LAB — LIPID PANEL
Chol/HDL Ratio: 4.3 ratio (ref 0.0–4.4)
Cholesterol, Total: 164 mg/dL (ref 100–199)
HDL: 38 mg/dL — ABNORMAL LOW (ref >39)
LDL Chol Calc (NIH): 109 mg/dL — ABNORMAL HIGH (ref 0–99)
Triglycerides: 92 mg/dL (ref 0–149)
VLDL Cholesterol Cal: 17 mg/dL (ref 5–40)

## 2020-05-19 LAB — TSH: TSH: 1.14 u[IU]/mL (ref 0.450–4.500)

## 2020-05-19 LAB — HEPATITIS C ANTIBODY: Hep C Virus Ab: 0.1 s/co ratio (ref 0.0–0.9)

## 2020-05-22 ENCOUNTER — Encounter: Payer: Self-pay | Admitting: Family Medicine

## 2020-05-22 DIAGNOSIS — F320 Major depressive disorder, single episode, mild: Secondary | ICD-10-CM | POA: Insufficient documentation

## 2020-05-22 NOTE — Assessment & Plan Note (Signed)
Situational, needs therapy , not suicidal or homicidal Refer to therapist

## 2020-05-22 NOTE — Assessment & Plan Note (Signed)
  Patient re-educated about  the importance of commitment to a  minimum of 150 minutes of exercise per week as able.  The importance of healthy food choices with portion control discussed, as well as eating regularly and within a 12 hour window most days. The need to choose "clean , green" food 50 to 75% of the time is discussed, as well as to make water the primary drink and set a goal of 64 ounces water daily.    Weight /BMI 05/18/2020 04/07/2020 04/03/2020  WEIGHT 245 lb 242 lb 244 lb 1.9 oz  HEIGHT 5\' 2"  5\' 2"  5' 2.5"  BMI 44.81 kg/m2 44.26 kg/m2 43.94 kg/m2

## 2020-05-22 NOTE — Assessment & Plan Note (Signed)
Controlled, no change in medication DASH diet and commitment to daily physical activity for a minimum of 30 minutes discussed and encouraged, as a part of hypertension management. The importance of attaining a healthy weight is also discussed.  BP/Weight 05/18/2020 05/11/2020 04/07/2020 04/03/2020 02/01/2020 10/13/2019 0/04/6225  Systolic BP 333 545 625 638 937 342 876  Diastolic BP 81 76 85 84 82 82 80  Wt. (Lbs) 245 - 242 244.12 242 242 244  BMI 44.81 - 44.26 43.94 44.26 41.54 43.[place]

## 2020-05-22 NOTE — Assessment & Plan Note (Signed)
Antibiotic prescribed , encouraged to flush with saline

## 2020-05-22 NOTE — Assessment & Plan Note (Signed)
Ongoing , needs therapy for help with addressing this refer to therapist who can engae sooner

## 2020-06-19 NOTE — Telephone Encounter (Signed)
I believe this is a duplicate - LVM for the pt top call me to confirm that this is complete

## 2020-07-04 ENCOUNTER — Other Ambulatory Visit: Payer: Self-pay | Admitting: Family Medicine

## 2020-07-05 ENCOUNTER — Telehealth: Payer: Self-pay

## 2020-07-05 NOTE — Telephone Encounter (Signed)
[facility] is calling about the attending physician statement form that they have been waiting on and states they have not received anything regarding her short term disability  [phone]

## 2020-07-06 NOTE — Telephone Encounter (Signed)
There are no forms to be completed, per the chart all the forms received have been completed.  Called and lvm to call with any questions.

## 2020-07-27 ENCOUNTER — Other Ambulatory Visit: Payer: Self-pay

## 2020-07-27 ENCOUNTER — Encounter: Payer: Self-pay | Admitting: Orthopaedic Surgery

## 2020-07-27 ENCOUNTER — Encounter: Payer: Self-pay | Admitting: Family Medicine

## 2020-07-27 ENCOUNTER — Ambulatory Visit (INDEPENDENT_AMBULATORY_CARE_PROVIDER_SITE_OTHER): Payer: BC Managed Care – PPO | Admitting: Family Medicine

## 2020-07-27 ENCOUNTER — Ambulatory Visit: Payer: BC Managed Care – PPO

## 2020-07-27 ENCOUNTER — Ambulatory Visit (INDEPENDENT_AMBULATORY_CARE_PROVIDER_SITE_OTHER): Payer: BC Managed Care – PPO | Admitting: Orthopaedic Surgery

## 2020-07-27 VITALS — BP 128/84 | HR 60 | Ht 62.0 in | Wt 242.0 lb

## 2020-07-27 VITALS — BP 138/64 | HR 69 | Ht 62.0 in | Wt 242.0 lb

## 2020-07-27 DIAGNOSIS — G5602 Carpal tunnel syndrome, left upper limb: Secondary | ICD-10-CM | POA: Insufficient documentation

## 2020-07-27 DIAGNOSIS — Z8269 Family history of other diseases of the musculoskeletal system and connective tissue: Secondary | ICD-10-CM

## 2020-07-27 DIAGNOSIS — M25571 Pain in right ankle and joints of right foot: Secondary | ICD-10-CM

## 2020-07-27 DIAGNOSIS — M67432 Ganglion, left wrist: Secondary | ICD-10-CM

## 2020-07-27 DIAGNOSIS — I10 Essential (primary) hypertension: Secondary | ICD-10-CM

## 2020-07-27 DIAGNOSIS — Z23 Encounter for immunization: Secondary | ICD-10-CM | POA: Diagnosis not present

## 2020-07-27 DIAGNOSIS — R7303 Prediabetes: Secondary | ICD-10-CM | POA: Diagnosis not present

## 2020-07-27 LAB — POCT GLYCOSYLATED HEMOGLOBIN (HGB A1C): Hemoglobin A1C: 5.8 % — AB (ref 4.0–5.6)

## 2020-07-27 NOTE — Assessment & Plan Note (Signed)
Controlled, no change in medication DASH diet and commitment to daily physical activity for a minimum of 30 minutes discussed and encouraged, as a part of hypertension management. The importance of attaining a healthy weight is also discussed.  BP/Weight 07/27/2020 05/18/2020 05/11/2020 04/07/2020 04/03/2020 02/01/2020 05/69/7948  Systolic BP 016 553 748 270 786 754 492  Diastolic BP 84 81 76 85 84 82 82  Wt. (Lbs) 242 245 - 242 244.12 242 242  BMI 44.26 44.81 - 44.26 43.94 44.26 41.54

## 2020-07-27 NOTE — Progress Notes (Signed)
Subjective:    Patient ID: [name], female    DOB: 1976-08-12, 44 y.o.   MRN: [id number]  HPI She woke up with marked ankle pain yesterday.  She has no trauma.  She has pain and swelling of the ankle.  She saw Dr. Moshe Cipro today and was referred here. I have reviewed Dr. Griffin Dakin notes.  The patient says she did not do anything that would make her ankle swell.  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  She has no twisting injury, no numbness.  Gout does run in her family, her mother and grandfather had gout.  She has not been tested for it.  I will get serum uric acid level.   Review of Systems  Constitutional: Positive for activity change.  Musculoskeletal: Positive for arthralgias, gait problem and joint swelling.  All other systems reviewed and are negative.  For Review of Systems, all other systems reviewed and are negative.  The following is a summary of the past history medically, past history surgically, known current medicines, social history and family history.  This information is gathered electronically by the computer from prior information and documentation.  I review this each visit and have found including this information at this point in the chart is beneficial and informative.   Past Medical History:  Diagnosis Date  . Allergic rhinitis   . Hyperlipidemia   . Hypertension   . Lumbar disc disease   . Obesity   . Syncope and collapse 09/2014   associated with over corrected htn, had cardiac and neurologic eval in 2015    Past Surgical History:  Procedure Laterality Date  . CHOLECYSTECTOMY    . LAPAROSCOPIC GASTRIC BANDING  August 18, 2010    Current Outpatient Medications on File Prior to Visit  Medication Sig Dispense Refill  . amLODipine (NORVASC) 5 MG tablet TAKE 1 TABLET(5 MG) BY MOUTH DAILY 90 tablet 1  . atorvastatin (LIPITOR) 40 MG tablet Take 1 tablet (40 mg total) by mouth daily. 90 tablet 3  . fluticasone (FLONASE) 50 MCG/ACT  nasal spray Place 1 spray into both nostrils 2 (two) times daily. 48 g 0  . levocetirizine (XYZAL) 5 MG tablet TAKE 1 TABLET(5 MG) BY MOUTH EVERY EVENING 90 tablet 0  . Multiple Vitamin (MULTIVITAMIN ADULT PO) Take by mouth.    . triamterene-hydrochlorothiazide (MAXZIDE) 75-50 MG tablet TAKE 1 TABLET BY MOUTH DAILY 30 tablet 3   No current facility-administered medications on file prior to visit.    Social History   Socioeconomic History  . Marital status: Married    Spouse name: Not on file  . Number of children: 2  . Years of education: college  . Highest education level: Not on file  Occupational History  . Not on file  Tobacco Use  . Smoking status: Never Smoker  . Smokeless tobacco: Never Used  Substance and Sexual Activity  . Alcohol use: No    Alcohol/week: 0.0 standard drinks  . Drug use: Never  . Sexual activity: Yes    Birth control/protection: None  Other Topics Concern  . Not on file  Social History Narrative   Patient is married and lives at home with her husband.    Social Determinants of Health   Financial Resource Strain:   . Difficulty of Paying Living Expenses: Not on file  Food Insecurity:   . Worried About Charity fundraiser in the Last Year: Not on file  . Ran Out  of Food in the Last Year: Not on file  Transportation Needs:   . Lack of Transportation (Medical): Not on file  . Lack of Transportation (Non-Medical): Not on file  Physical Activity:   . Days of Exercise per Week: Not on file  . Minutes of Exercise per Session: Not on file  Stress:   . Feeling of Stress : Not on file  Social Connections:   . Frequency of Communication with Friends and Family: Not on file  . Frequency of Social Gatherings with Friends and Family: Not on file  . Attends Religious Services: Not on file  . Active Member of Clubs or Organizations: Not on file  . Attends Archivist Meetings: Not on file  . Marital Status: Not on file  Intimate Partner  Violence:   . Fear of Current or Ex-Partner: Not on file  . Emotionally Abused: Not on file  . Physically Abused: Not on file  . Sexually Abused: Not on file    Family History  Problem Relation Age of Onset  . Hypertension Mother   . Hyperlipidemia Mother   . Diabetes Mother   . Cancer Mother        breast   . Obesity Mother   . Hypertension Brother   . Stroke Brother   . Heart disease Father   . Heart attack Father   . Alcohol abuse Maternal Aunt   . Drug abuse Maternal Uncle     BP 138/64   Pulse 69   Ht 5\' 2"  (1.575 m)   Wt 242 lb (109.8 kg)   BMI 44.26 kg/m   Body mass index is 44.26 kg/m.      Objective:   Physical Exam Vitals and nursing note reviewed. Exam conducted with a chaperone present.  Constitutional:      Appearance: She is well-developed.  HENT:     Head: Normocephalic and atraumatic.  Eyes:     Conjunctiva/sclera: Conjunctivae normal.     Pupils: Pupils are equal, round, and reactive to light.  Cardiovascular:     Rate and Rhythm: Normal rate and regular rhythm.  Pulmonary:     Effort: Pulmonary effort is normal.  Abdominal:     Palpations: Abdomen is soft.  Musculoskeletal:     Cervical back: Normal range of motion and neck supple.       Feet:  Skin:    General: Skin is warm and dry.  Neurological:     Mental Status: She is alert and oriented to person, place, and time.     Cranial Nerves: No cranial nerve deficit.     Motor: No abnormal muscle tone.     Coordination: Coordination normal.     Deep Tendon Reflexes: Reflexes are normal and symmetric. Reflexes normal.  Psychiatric:        Behavior: Behavior normal.        Thought Content: Thought content normal.        Judgment: Judgment normal.    X-rays were done of the right ankle, reported separately.       Assessment & Plan:   Encounter Diagnoses  Name Primary?  . Pain in right ankle and joints of right foot Yes  . Family history of gout    I will get serum uric acid  level.  I have given sheet of instructions for contrast baths.  I will give ankle brace.  Return in one week.  Call if any problem.  Precautions discussed.   Electronically [place]  Luna Glasgow, MD 9/30/202110:19 AM

## 2020-07-27 NOTE — Assessment & Plan Note (Signed)
  Patient re-educated about  the importance of commitment to a  minimum of 150 minutes of exercise per week as able.  The importance of healthy food choices with portion control discussed, as well as eating regularly and within a 12 hour window most days. The need to choose "clean , green" food 50 to 75% of the time is discussed, as well as to make water the primary drink and set a goal of 64 ounces water daily.    Weight /BMI 07/27/2020 05/18/2020 04/07/2020  WEIGHT 242 lb 245 lb 242 lb  HEIGHT 5\' 2"  5\' 2"  5\' 2"   BMI 44.26 kg/m2 44.81 kg/m2 44.26 kg/m2

## 2020-07-27 NOTE — Assessment & Plan Note (Addendum)
2 day h/o acute right ankle pain on pressure with difficulty weight bearing, Ortho eval

## 2020-07-27 NOTE — Patient Instructions (Signed)
In office with MD in early February, call if you need me sooner  Condolence and prayers for you and your family  Flu vaccine today  GlycohB in office today.  You are referred urgently to Ortho re right ankle  You are referred to Dr Amedeo Plenty re left hand   It is important that you exercise regularly at least 30 minutes 5 times a week. If you develop chest pain, have severe difficulty breathing, or feel very tired, stop exercising immediately and seek medical attention  Think about what you will eat, plan ahead. Choose " clean, green, fresh or frozen" over canned, processed or packaged foods which are more sugary, salty and fatty. 70 to 75% of food eaten should be vegetables and fruit. Three meals at set times with snacks allowed between meals, but they must be fruit or vegetables. Aim to eat over a 12 hour period , example 7 am to 7 pm, and STOP after  your last meal of the day. Drink water,generally about 64 ounces per day, no other drink is as healthy. Fruit juice is best enjoyed in a healthy way, by EATING the fruit. Thanks for choosing [facility], we consider it a privelige to serve you.

## 2020-07-29 LAB — URIC ACID: Uric Acid, Serum: 8.1 mg/dL — ABNORMAL HIGH (ref 2.5–7.0)

## 2020-07-30 DIAGNOSIS — M67432 Ganglion, left wrist: Secondary | ICD-10-CM | POA: Insufficient documentation

## 2020-07-30 NOTE — Assessment & Plan Note (Signed)
Progressive pain and weakness refer to ortho

## 2020-07-30 NOTE — Progress Notes (Signed)
ELISABET Taylor     MRN: [id number]      DOB: 1976-06-21   HPI [name] is here for follow up and re-evaluation of chronic medical conditions, medication management and review of any available recent lab and radiology data.  Preventive health is updated, specifically  Cancer screening and Immunization.   Questions or concerns regarding consultations or procedures which the PT has had in the interim are  addressed. The PT denies any adverse reactions to current medications since the last visit.  Tender cyst on left wrist, and weaknesses of the hand x 3 weeks, no inciting trauma 2 day h/o right ankle pain with reduced ROM, no inciting trauma, has to evert foot tow walk  ROS Denies recent fever or chills. Denies sinus pressure, nasal congestion, ear pain or sore throat. Denies chest congestion, productive cough or wheezing. Denies chest pains, palpitations and leg swelling Denies abdominal pain, nausea, vomiting,diarrhea or constipation.   Denies dysuria, frequency, hesitancy or incontinence.  Denies headaches, seizures, numbness, or tingling. Denies depression, anxiety or insomnia.Improvemen in grief at loss of Mom, struggling with her brother Denies skin break down or rash.   PE  BP 128/84   Pulse 60   Ht 5\' 2"  (1.575 m)   Wt 242 lb (109.8 kg)   BMI 44.26 kg/m   Patient alert and oriented and in no cardiopulmonary distress.  HEENT: No facial asymmetry, EOMI,     Neck supple .  Chest: Clear to auscultation bilaterally.  CVS: S1, S2 no murmurs, no S3.Regular rate.  ABD: Soft non tender.   Ext: No edema  MS: Adequate ROM spine, shoulders, hips and knees. Ganglion cyst left hand and positive tinnel's with thenar wasting Decreased ROM right ankle with tenderness anteriorly Skin: Intact, no ulcerations or rash noted.  Psych: Good eye contact, normal affect. Memory intact not anxious or depressed appearing.  CNS: CN 2-12 intact, power,  normal throughout.no  focal deficits noted.   Assessment & Plan  MORBID OBESITY  Patient re-educated about  the importance of commitment to a  minimum of 150 minutes of exercise per week as able.  The importance of healthy food choices with portion control discussed, as well as eating regularly and within a 12 hour window most days. The need to choose "clean , green" food 50 to 75% of the time is discussed, as well as to make water the primary drink and set a goal of 64 ounces water daily.    Weight /BMI 07/27/2020 05/18/2020 04/07/2020  WEIGHT 242 lb 245 lb 242 lb  HEIGHT 5\' 2"  5\' 2"  5\' 2"   BMI 44.26 kg/m2 44.81 kg/m2 44.26 kg/m2      Essential hypertension Controlled, no change in medication DASH diet and commitment to daily physical activity for a minimum of 30 minutes discussed and encouraged, as a part of hypertension management. The importance of attaining a healthy weight is also discussed.  BP/Weight 07/27/2020 05/18/2020 05/11/2020 04/07/2020 04/03/2020 02/01/2020 33/82/5053  Systolic BP 976 734 193 790 240 973 532  Diastolic BP 84 81 76 85 84 82 82  Wt. (Lbs) 242 245 - 242 244.12 242 242  BMI 44.26 44.81 - 44.26 43.94 44.26 41.54       Ankle pain, right 2 day h/o acute right ankle pain on pressure with difficulty weight bearing, Ortho eval  Prediabetes Patient educated about the importance of limiting  Carbohydrate intake , the need to commit to daily physical activity for a minimum of 30 minutes ,  and to commit weight loss. The fact that changes in all these areas will reduce or eliminate all together the development of diabetes is stressed.  Improved, which is good  Diabetic Labs Latest Ref Rng & Units 07/27/2020 05/18/2020 02/15/2020 11/26/2019 05/11/2019  HbA1c 4.0 - 5.6 % 5.8(A) - - 6.0(H) 5.9(H)  Chol 100 - 199 mg/dL - 164 - 153 185  HDL >39 mg/dL - 38(L) - 39(L) 40(L)  Calc LDL 0 - 99 mg/dL - 109(H) - 99 124(H)  Triglycerides 0 - 149 mg/dL - 92 - 63 99  Creatinine 0.50 - 1.10 mg/dL - -  1.25(H) 1.11(H) 1.35(H)   BP/Weight 07/27/2020 07/27/2020 05/18/2020 05/11/2020 04/07/2020 1/0/9604 02/29/980  Systolic BP 191 478 295 621 308 657 846  Diastolic BP 64 84 81 76 85 84 82  Wt. (Lbs) 242 242 245 - 242 244.12 242  BMI 44.26 44.26 44.81 - 44.26 43.94 44.26   No flowsheet data found.    Carpal tunnel syndrome on left Progressive pain and weakness refer to ortho  Ganglion cyst of wrist, left Refer to Ortho hand, increasing in size and painful at times

## 2020-07-30 NOTE — Assessment & Plan Note (Signed)
Patient educated about the importance of limiting  Carbohydrate intake , the need to commit to daily physical activity for a minimum of 30 minutes , and to commit weight loss. The fact that changes in all these areas will reduce or eliminate all together the development of diabetes is stressed.  Improved, which is good  Diabetic Labs Latest Ref Rng & Units 07/27/2020 05/18/2020 02/15/2020 11/26/2019 05/11/2019  HbA1c 4.0 - 5.6 % 5.8(A) - - 6.0(H) 5.9(H)  Chol 100 - 199 mg/dL - 164 - 153 185  HDL >39 mg/dL - 38(L) - 39(L) 40(L)  Calc LDL 0 - 99 mg/dL - 109(H) - 99 124(H)  Triglycerides 0 - 149 mg/dL - 92 - 63 99  Creatinine 0.50 - 1.10 mg/dL - - 1.25(H) 1.11(H) 1.35(H)   BP/Weight 07/27/2020 07/27/2020 05/18/2020 05/11/2020 04/07/2020 05/29/9561 10/31/863  Systolic BP 784 696 295 284 132 440 102  Diastolic BP 64 84 81 76 85 84 82  Wt. (Lbs) 242 242 245 - 242 244.12 242  BMI 44.26 44.26 44.81 - 44.26 43.94 44.26   No flowsheet data found.

## 2020-07-30 NOTE — Assessment & Plan Note (Signed)
Refer to Ortho hand, increasing in size and painful at times

## 2020-07-31 ENCOUNTER — Telehealth: Payer: Self-pay | Admitting: Radiology

## 2020-07-31 MED ORDER — COLCHICINE 0.6 MG PO TABS: ORAL_TABLET | ORAL | 3 refills | Status: DC

## 2020-07-31 MED ORDER — ALLOPURINOL 300 MG PO TABS: 300.0000 mg | ORAL_TABLET | Freq: Every day | ORAL | 5 refills | Status: DC

## 2020-07-31 NOTE — Telephone Encounter (Signed)
-----   Message from Sanjuana Kava, MD sent at 07/30/2020  9:11 PM EDT ----- Regarding: Medicine She has gout.  I need to call in allopurinol and colchicine.  Please request this so I can do it and let the patient know she has gout, serum uric acid elevated.  Thanks. ----- Message ----- From: Interface, [facility] Lab Results In Sent: 07/29/2020  12:12 AM EDT To: Sanjuana Kava, MD

## 2020-07-31 NOTE — Telephone Encounter (Signed)
Advised patient of labs, advised her meds will be sent in  Allopurinol to prevent flares/ colchicine to use in case of flares  She voiced understanding

## 2020-08-03 ENCOUNTER — Ambulatory Visit: Payer: BC Managed Care – PPO | Admitting: Orthopaedic Surgery

## 2020-08-07 ENCOUNTER — Ambulatory Visit: Payer: BC Managed Care – PPO | Admitting: Family Medicine

## 2020-08-08 ENCOUNTER — Ambulatory Visit (INDEPENDENT_AMBULATORY_CARE_PROVIDER_SITE_OTHER): Payer: BC Managed Care – PPO | Admitting: Orthopaedic Surgery

## 2020-08-08 ENCOUNTER — Other Ambulatory Visit: Payer: Self-pay

## 2020-08-08 ENCOUNTER — Encounter: Payer: Self-pay | Admitting: Orthopaedic Surgery

## 2020-08-08 VITALS — Ht 62.0 in | Wt 239.0 lb

## 2020-08-08 DIAGNOSIS — Z6841 Body Mass Index (BMI) 40.0 and over, adult: Secondary | ICD-10-CM | POA: Diagnosis not present

## 2020-08-08 DIAGNOSIS — N83201 Unspecified ovarian cyst, right side: Secondary | ICD-10-CM | POA: Insufficient documentation

## 2020-08-08 DIAGNOSIS — M1A0710 Idiopathic chronic gout, right ankle and foot, without tophus (tophi): Secondary | ICD-10-CM

## 2020-08-08 NOTE — Progress Notes (Signed)
Patient [name], female DOB:Oct 12, 1976, 44 y.o. [id number]  Chief Complaint  Patient presents with  . Foot Pain    Rt foot and ankle    HPI  [name] is a 44 y.o. female who has right foot pain.  She had elevated uric acid level of 8.1.  I have told her about gout and what it is.  She needs to be on the medicine daily.  She has inherited it.  She could not tolerate the colchicine.   Body mass index is 43.71 kg/m.  The patient meets the AMA guidelines for Morbid (severe) obesity with a BMI > 40.0 and I have recommended weight loss.   ROS  Review of Systems  Constitutional: Positive for activity change.  Musculoskeletal: Positive for arthralgias, gait problem and joint swelling.  All other systems reviewed and are negative.   All other systems reviewed and are negative.  The following is a summary of the past history medically, past history surgically, known current medicines, social history and family history.  This information is gathered electronically by the computer from prior information and documentation.  I review this each visit and have found including this information at this point in the chart is beneficial and informative.    Past Medical History:  Diagnosis Date  . Allergic rhinitis   . Hyperlipidemia   . Hypertension   . Lumbar disc disease   . Obesity   . Syncope and collapse 09/2014   associated with over corrected htn, had cardiac and neurologic eval in 2015    Past Surgical History:  Procedure Laterality Date  . CHOLECYSTECTOMY    . LAPAROSCOPIC GASTRIC BANDING  August 18, 2010    Family History  Problem Relation Age of Onset  . Hypertension Mother   . Hyperlipidemia Mother   . Diabetes Mother   . Cancer Mother        breast   . Obesity Mother   . Hypertension Brother   . Stroke Brother   . Heart disease Father   . Heart attack Father   . Alcohol abuse Maternal Aunt   . Drug abuse Maternal Uncle     Social  History Social History   Tobacco Use  . Smoking status: Never Smoker  . Smokeless tobacco: Never Used  Substance Use Topics  . Alcohol use: No    Alcohol/week: 0.0 standard drinks  . Drug use: Never    Allergies  Allergen Reactions  . Hydrocodone-Acetaminophen Other (See Comments)    Tachycardia    Current Outpatient Medications  Medication Sig Dispense Refill  . amLODipine (NORVASC) 5 MG tablet TAKE 1 TABLET(5 MG) BY MOUTH DAILY 90 tablet 1  . atorvastatin (LIPITOR) 40 MG tablet Take 1 tablet (40 mg total) by mouth daily. 90 tablet 3  . fluticasone (FLONASE) 50 MCG/ACT nasal spray Place 1 spray into both nostrils 2 (two) times daily. 48 g 0  . levocetirizine (XYZAL) 5 MG tablet TAKE 1 TABLET(5 MG) BY MOUTH EVERY EVENING 90 tablet 0  . Multiple Vitamin (MULTIVITAMIN ADULT PO) Take by mouth.    . triamterene-hydrochlorothiazide (MAXZIDE) 75-50 MG tablet TAKE 1 TABLET BY MOUTH DAILY 30 tablet 3  . allopurinol (ZYLOPRIM) 300 MG tablet Take 1 tablet (300 mg total) by mouth daily. 30 tablet 5  . colchicine 0.6 MG tablet One by mouth three times a day for five days for gout pain. 15 tablet 3   No current facility-administered medications for this visit.  Physical Exam  Height 5\' 2"  (1.575 m), weight 239 lb (108.4 kg).  Constitutional: overall normal hygiene, normal nutrition, well developed, normal grooming, normal body habitus. Assistive device:none  Musculoskeletal: gait and station Limp none, muscle tone and strength are normal, no tremors or atrophy is present.  .  Neurological: coordination overall normal.  Deep tendon reflex/nerve stretch intact.  Sensation normal.  Cranial nerves II-XII intact.   Skin:   Normal overall no scars, lesions, ulcers or rashes. No psoriasis.  Psychiatric: Alert and oriented x 3.  Recent memory intact, remote memory unclear.  Normal mood and affect. Well groomed.  Good eye contact.  Cardiovascular: overall no swelling, no varicosities, no  edema bilaterally, normal temperatures of the legs and arms, no clubbing, cyanosis and good capillary refill.  Right foot is tender but noat painful.  She is walking better.  NV intact.  Lymphatic: palpation is normal.  All other systems reviewed and are negative   The patient has been educated about the nature of the problem(s) and counseled on treatment options.  The patient appeared to understand what I have discussed and is in agreement with it.  Encounter Diagnoses  Name Primary?  . Idiopathic chronic gout of right foot without tophus Yes  . Body mass index 40.0-44.9, adult ([place])   . Morbid obesity ([place])     PLAN Call if any problems.  Precautions discussed.  Continue current medications.   Return to clinic 1 month   Electronically Signed Sanjuana Kava, MD 10/12/20218:32 AM

## 2020-08-08 NOTE — Patient Instructions (Addendum)
You will use the Allopurinol daily for the rest of your life. It will prevent flares of gout. You can use the colchicine as needed if you have a flare.The colchicine will cause diarrhea (that is how your body gets rid of the uric acid). Each time you have a flare it will be worse if you do not use the Allopurinol.    Gout  Gout is painful swelling of your joints. Gout is a type of arthritis. It is caused by having too much uric acid in your body. Uric acid is a chemical that is made when your body breaks down substances called purines. If your body has too much uric acid, sharp crystals can form and build up in your joints. This causes pain and swelling. Gout attacks can happen quickly and be very painful (acute gout). Over time, the attacks can affect more joints and happen more often (chronic gout). What are the causes?  Too much uric acid in your blood. This can happen because: ? Your kidneys do not remove enough uric acid from your blood. ? Your body makes too much uric acid. ? You eat too many foods that are high in purines. These foods include organ meats, some seafood, and beer.  Trauma or stress. What increases the risk?  Having a family history of gout.  Being female and middle-aged.  Being female and having gone through menopause.  Being very overweight (obese).  Drinking alcohol, especially beer.  Not having enough water in the body (being dehydrated).  Losing weight too quickly.  Having an organ transplant.  Having lead poisoning.  Taking certain medicines.  Having kidney disease.  Having a skin condition called psoriasis. What are the signs or symptoms? An attack of acute gout usually happens in just one joint. The most common place is the big toe. Attacks often start at night. Other joints that may be affected include joints of the feet, ankle, knee, fingers, wrist, or elbow. Symptoms of an attack may include:  Very bad  pain.  Warmth.  Swelling.  Stiffness.  Shiny, red, or purple skin.  Tenderness. The affected joint may be very painful to touch.  Chills and fever. Chronic gout may cause symptoms more often. More joints may be involved. You may also have white or yellow lumps (tophi) on your hands or feet or in other areas near your joints. How is this treated?  Treatment for this condition has two phases: treating an acute attack and preventing future attacks.  Acute gout treatment may include: ? NSAIDs. ? Steroids. These are taken by mouth or injected into a joint. ? Colchicine. This medicine relieves pain and swelling. It can be given by mouth or through an IV tube.  Preventive treatment may include: ? Taking small doses of NSAIDs or colchicine daily. ? Using a medicine that reduces uric acid levels in your blood. ? Making changes to your diet. You may need to see a food expert (dietitian) about what to eat and drink to prevent gout. Follow these instructions at home: During a gout attack   If told, put ice on the painful area: ? Put ice in a plastic bag. ? Place a towel between your skin and the bag. ? Leave the ice on for 20 minutes, 2-3 times a day.  Raise (elevate) the painful joint above the level of your heart as often as you can.  Rest the joint as much as possible. If the joint is in your leg, you may be given  crutches.  Follow instructions from your doctor about what you cannot eat or drink. Avoiding future gout attacks  Eat a low-purine diet. Avoid foods and drinks such as: ? Liver. ? Kidney. ? Anchovies. ? Asparagus. ? Herring. ? Mushrooms. ? Mussels. ? Beer.  Stay at a healthy weight. If you want to lose weight, talk with your doctor. Do not lose weight too fast.  Start or continue an exercise plan as told by your doctor. Eating and drinking  Drink enough fluids to keep your pee (urine) pale yellow.  If you drink alcohol: ? Limit how much you use to:  0-1  drink a day for women.  0-2 drinks a day for men. ? Be aware of how much alcohol is in your drink. In the [place] one drink equals one 12 oz bottle of beer (355 mL), one 5 oz glass of wine (148 mL), or one 1 oz glass of hard liquor (44 mL). General instructions  Take over-the-counter and prescription medicines only as told by your doctor.  Do not drive or use heavy machinery while taking prescription pain medicine.  Return to your normal activities as told by your doctor. Ask your doctor what activities are safe for you.  Keep all follow-up visits as told by your doctor. This is important. Contact a doctor if:  You have another gout attack.  You still have symptoms of a gout attack after 10 days of treatment.  You have problems (side effects) because of your medicines.  You have chills or a fever.  You have burning pain when you pee (urinate).  You have pain in your lower back or belly. Get help right away if:  You have very bad pain.  Your pain cannot be controlled.  You cannot pee. Summary  Gout is painful swelling of the joints.  The most common site of pain is the big toe, but it can affect other joints.  Medicines and avoiding some foods can help to prevent and treat gout attacks. This information is not intended to replace advice given to you by your health care provider. Make sure you discuss any questions you have with your health care provider. Document Revised: 05/06/2018 Document Reviewed: 05/06/2018 Elsevier Patient Education  [place].

## 2020-08-10 ENCOUNTER — Telehealth: Payer: Self-pay

## 2020-08-10 NOTE — Telephone Encounter (Signed)
Pt called --she had not heard anything from [facility]--send a release   Follow up

## 2020-08-25 NOTE — Telephone Encounter (Signed)
Called [facility] with pt has appt on Nov 2 @ 2:15

## 2020-09-05 ENCOUNTER — Ambulatory Visit: Payer: BC Managed Care – PPO | Admitting: Orthopaedic Surgery

## 2020-09-07 ENCOUNTER — Encounter: Payer: Self-pay | Admitting: Family Medicine

## 2020-09-07 ENCOUNTER — Encounter: Payer: Self-pay | Admitting: Orthopaedic Surgery

## 2020-09-07 ENCOUNTER — Ambulatory Visit (INDEPENDENT_AMBULATORY_CARE_PROVIDER_SITE_OTHER): Payer: BC Managed Care – PPO | Admitting: Orthopaedic Surgery

## 2020-09-07 ENCOUNTER — Other Ambulatory Visit: Payer: Self-pay

## 2020-09-07 VITALS — Ht 62.0 in | Wt 238.0 lb

## 2020-09-07 DIAGNOSIS — Z6841 Body Mass Index (BMI) 40.0 and over, adult: Secondary | ICD-10-CM | POA: Diagnosis not present

## 2020-09-07 DIAGNOSIS — M1A0710 Idiopathic chronic gout, right ankle and foot, without tophus (tophi): Secondary | ICD-10-CM

## 2020-09-07 MED ORDER — ATORVASTATIN CALCIUM 40 MG PO TABS
40.0000 mg | ORAL_TABLET | Freq: Every day | ORAL | 0 refills | Status: DC
Start: 2020-09-07 — End: 2020-12-21

## 2020-09-07 NOTE — Progress Notes (Signed)
Her gout pain in the foot is much improved.  She is taking the allopurinol.  She understands gout and need to continue the allopurinol.  I will see her as needed.  Encounter Diagnoses  Name Primary?  . Idiopathic chronic gout of right foot without tophus Yes  . Body mass index 40.0-44.9, adult ([place])   . Morbid obesity ([place])    Call if any problem.  Precautions discussed.   Electronically Signed Sanjuana Kava, MD 11/11/20219:28 AM

## 2020-09-14 ENCOUNTER — Ambulatory Visit: Payer: BC Managed Care – PPO | Admitting: Family Medicine

## 2020-09-27 ENCOUNTER — Ambulatory Visit: Payer: BC Managed Care – PPO | Admitting: Family Medicine

## 2020-10-16 ENCOUNTER — Other Ambulatory Visit: Payer: Self-pay

## 2020-10-16 ENCOUNTER — Ambulatory Visit
Admission: EM | Admit: 2020-10-16 | Discharge: 2020-10-16 | Disposition: A | Discharge status: Home / Self Care | Payer: BC Managed Care – PPO | Attending: Urgent Care | Admitting: Urgent Care

## 2020-10-16 DIAGNOSIS — M542 Cervicalgia: Secondary | ICD-10-CM

## 2020-10-16 DIAGNOSIS — M545 Low back pain, unspecified: Secondary | ICD-10-CM | POA: Diagnosis not present

## 2020-10-16 DIAGNOSIS — S39012A Strain of muscle, fascia and tendon of lower back, initial encounter: Secondary | ICD-10-CM

## 2020-10-16 MED ORDER — NAPROXEN 500 MG PO TABS: 500.0000 mg | ORAL_TABLET | Freq: Two times a day (BID) | ORAL | 0 refills | Status: DC

## 2020-10-16 MED ORDER — TIZANIDINE HCL 4 MG PO TABS: 4.0000 mg | ORAL_TABLET | Freq: Four times a day (QID) | ORAL | 0 refills | Status: DC | PRN
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
## 2020-10-19 ENCOUNTER — Encounter: Payer: Self-pay | Admitting: Family Medicine

## 2020-10-19 ENCOUNTER — Other Ambulatory Visit: Payer: Self-pay

## 2020-10-19 ENCOUNTER — Ambulatory Visit (INDEPENDENT_AMBULATORY_CARE_PROVIDER_SITE_OTHER): Payer: BC Managed Care – PPO | Admitting: Family Medicine

## 2020-10-19 VITALS — BP 113/79 | HR 52 | Ht 63.0 in | Wt 241.1 lb

## 2020-10-19 DIAGNOSIS — E7800 Pure hypercholesterolemia, unspecified: Secondary | ICD-10-CM | POA: Diagnosis not present

## 2020-10-19 DIAGNOSIS — Z1211 Encounter for screening for malignant neoplasm of colon: Secondary | ICD-10-CM

## 2020-10-19 DIAGNOSIS — I10 Essential (primary) hypertension: Secondary | ICD-10-CM | POA: Diagnosis not present

## 2020-10-19 DIAGNOSIS — R7303 Prediabetes: Secondary | ICD-10-CM | POA: Diagnosis not present

## 2020-10-19 NOTE — Assessment & Plan Note (Addendum)
Refer ortho advised pT rather than chiropractor, continue NSAID and muscle relaxant as prescribed
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
## 2020-10-23 ENCOUNTER — Encounter: Payer: Self-pay | Admitting: Family Medicine

## 2020-10-23 NOTE — Assessment & Plan Note (Signed)
  Patient re-educated about  the importance of commitment to a  minimum of 150 minutes of exercise per week as able.  The importance of healthy food choices with portion control discussed, as well as eating regularly and within a 12 hour window most days. The need to choose "clean , green" food 50 to 75% of the time is discussed, as well as to make water the primary drink and set a goal of 64 ounces water daily.    Weight /BMI 10/19/2020 09/07/2020 08/08/2020  WEIGHT 241 lb 1.9 oz 238 lb 239 lb  HEIGHT 5\' 3"  5\' 2"  5\' 2"   BMI 42.71 kg/m2 43.53 kg/m2 43.71 kg/m2

## 2020-10-23 NOTE — Assessment & Plan Note (Signed)
Hyperlipidemia:Low fat diet discussed and encouraged.   Lipid Panel  Lab Results  Component Value Date   CHOL 164 05/18/2020   HDL 38 (L) 05/18/2020   LDLCALC 109 (H) 05/18/2020   TRIG 92 05/18/2020   CHOLHDL 4.3 05/18/2020     Needs to reduce fa in diet Updated lab needed at/ before next visit.

## 2020-10-23 NOTE — Assessment & Plan Note (Signed)
Controlled, no change in medication DASH diet and commitment to daily physical activity for a minimum of 30 minutes discussed and encouraged, as a part of hypertension management. The importance of attaining a healthy weight is also discussed.  BP/Weight 10/19/2020 10/16/2020 09/07/2020 08/08/2020 07/27/2020 07/27/2020 05/18/2020  Systolic BP 113 123 - - 138 128 118  Diastolic BP 79 85 - - 64 84 81  Wt. (Lbs) 241.12 - 238 239 242 242 245  BMI 42.71 - 43.53 43.71 44.26 44.26 44.81

## 2020-10-25 ENCOUNTER — Encounter: Payer: Self-pay | Admitting: Internal Medicine

## 2020-11-15 ENCOUNTER — Ambulatory Visit: Payer: BC Managed Care – PPO

## 2020-11-15 ENCOUNTER — Encounter: Payer: Self-pay | Admitting: Internal Medicine

## 2020-11-23 ENCOUNTER — Other Ambulatory Visit: Payer: Self-pay | Admitting: Obstetrics & Gynecology

## 2020-12-21 ENCOUNTER — Other Ambulatory Visit: Payer: Self-pay | Admitting: Family Medicine

## 2020-12-21 MED ORDER — ATORVASTATIN CALCIUM 40 MG PO TABS: 40.0000 mg | ORAL_TABLET | Freq: Every day | ORAL | 0 refills | Status: DC

## 2021-01-22 ENCOUNTER — Telehealth: Payer: Self-pay

## 2021-01-22 ENCOUNTER — Other Ambulatory Visit: Payer: Self-pay

## 2021-01-22 MED ORDER — AMLODIPINE BESYLATE 5 MG PO TABS: ORAL_TABLET | ORAL | 1 refills | Status: DC

## 2021-01-22 NOTE — Telephone Encounter (Signed)
Med refilled.

## 2021-01-22 NOTE — Telephone Encounter (Signed)
Patient called need med refill  amLODipine (NORVASC) 5 MG tablet   Pharmacy: [facility]

## 2021-01-30 ENCOUNTER — Telehealth: Payer: Self-pay

## 2021-01-30 ENCOUNTER — Other Ambulatory Visit: Payer: Self-pay | Admitting: *Deleted

## 2021-01-30 MED ORDER — TRIAMTERENE-HCTZ 75-50 MG PO TABS: 1.0000 | ORAL_TABLET | Freq: Every day | ORAL | 3 refills | Status: DC

## 2021-01-30 NOTE — Telephone Encounter (Signed)
Please send Maxzide to the pharmacy to be filled

## 2021-01-30 NOTE — Telephone Encounter (Signed)
Maxide sent to pt pharmacy

## 2021-02-05 ENCOUNTER — Other Ambulatory Visit: Payer: Self-pay

## 2021-02-05 ENCOUNTER — Encounter: Payer: Self-pay | Admitting: Emergency Medicine

## 2021-02-05 ENCOUNTER — Ambulatory Visit
Admission: EM | Admit: 2021-02-05 | Discharge: 2021-02-05 | Disposition: A | Discharge status: Home / Self Care | Payer: BC Managed Care – PPO | Attending: Family Medicine | Admitting: Family Medicine

## 2021-02-05 DIAGNOSIS — R490 Dysphonia: Secondary | ICD-10-CM

## 2021-02-05 DIAGNOSIS — J0140 Acute pansinusitis, unspecified: Secondary | ICD-10-CM

## 2021-02-05 MED ORDER — AMOXICILLIN-POT CLAVULANATE 875-125 MG PO TABS: 1.0000 | ORAL_TABLET | Freq: Two times a day (BID) | ORAL | 0 refills | Status: DC

## 2021-02-05 MED ORDER — FLUCONAZOLE 150 MG PO TABS: 150.0000 mg | ORAL_TABLET | Freq: Once | ORAL | 0 refills | Status: AC

## 2021-02-05 NOTE — ED Triage Notes (Signed)
Sinus pressure, headache and congestion since Thursday.  Tried otc meds with no relief
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
## 2021-02-05 NOTE — Discharge Instructions (Addendum)
Continue allergy regimen as you are currently taking.  Complete entire course of antibiotics.  I have also sent over Diflucan if needed for secondary yeast infection related to antibiotic use.

## 2021-02-20 ENCOUNTER — Ambulatory Visit: Payer: BC Managed Care – PPO

## 2021-02-21 ENCOUNTER — Other Ambulatory Visit: Payer: Self-pay

## 2021-02-21 ENCOUNTER — Ambulatory Visit (INDEPENDENT_AMBULATORY_CARE_PROVIDER_SITE_OTHER): Payer: Self-pay | Admitting: *Deleted

## 2021-02-21 ENCOUNTER — Telehealth: Payer: Self-pay | Admitting: *Deleted

## 2021-02-21 VITALS — Ht 62.0 in | Wt 236.0 lb

## 2021-02-21 DIAGNOSIS — Z1211 Encounter for screening for malignant neoplasm of colon: Secondary | ICD-10-CM

## 2021-02-21 MED ORDER — CLENPIQ 10-3.5-12 MG-GM -GM/160ML PO SOLN: 1.0000 | Freq: Once | ORAL | 0 refills | Status: AC

## 2021-02-21 NOTE — Progress Notes (Signed)
Ok to schedule. ASA III (BMI > 40) 

## 2021-02-21 NOTE — Progress Notes (Signed)
Gastroenterology Pre-Procedure Review  Request Date: 02/21/2021 Requesting Physician: Dr. Moshe Cipro, no previous TCS  PATIENT REVIEW QUESTIONS: The patient responded to the following health history questions as indicated:    1. Diabetes Melitis: no 2. Joint replacements in the past 12 months: no 3. Major health problems in the past 3 months: no 4. Has an artificial valve or MVP: no 5. Has a defibrillator: no 6. Has been advised in past to take antibiotics in advance of a procedure like teeth cleaning: no 7. Family history of colon cancer: no  8. Alcohol Use: no 9. Illicit drug Use: no 10. History of sleep apnea: no  11. History of coronary artery or other vascular stents placed within the last 12 months: no 12. History of any prior anesthesia complications: no 13. Body mass index is 43.16 kg/m.    MEDICATIONS & ALLERGIES:    Patient reports the following regarding taking any blood thinners:   Plavix? no Aspirin? no Coumadin? no Brilinta? no Xarelto? no Eliquis? no Pradaxa? no Savaysa? no Effient? no  Patient confirms/reports the following medications:  Current Outpatient Medications  Medication Sig Dispense Refill  . allopurinol (ZYLOPRIM) 300 MG tablet Take 1 tablet (300 mg total) by mouth daily. 30 tablet 5  . amLODipine (NORVASC) 5 MG tablet TAKE 1 TABLET(5 MG) BY MOUTH DAILY 90 tablet 1  . atorvastatin (LIPITOR) 40 MG tablet Take 1 tablet (40 mg total) by mouth daily. 90 tablet 0  . fluticasone (FLONASE) 50 MCG/ACT nasal spray Place 1 spray into both nostrils 2 (two) times daily. (Patient taking differently: Place 1 spray into both nostrils as needed.) 48 g 0  . Multiple Vitamin (MULTIVITAMIN ADULT PO) Take by mouth daily.    Marland Kitchen triamterene-hydrochlorothiazide (MAXZIDE) 75-50 MG tablet Take 1 tablet by mouth daily. 30 tablet 3   No current facility-administered medications for this visit.    Patient confirms/reports the following allergies:  Allergies  Allergen  Reactions  . Hydrocodone-Acetaminophen Other (See Comments)    Tachycardia  . Hydrocodone-Acetaminophen Other (See Comments)    No orders of the defined types were placed in this encounter.   AUTHORIZATION INFORMATION Primary Insurance: [facility],  [place] #:[id number] ,  Group #: [id number] Pre-Cert / Josem Kaufmann required: No, not required  Secondary Insurance: [employer],  [place] #: [id number] ,  Group #: [id number] Pre-Cert / Auth required: No, file to local BCBS  SCHEDULE INFORMATION: Procedure has been scheduled as follows:  Date: 04/13/2021, Time: 9:30 Location: [facility] with Dr. Abbey Chatters  This Gastroenterology Pre-Precedure Review Form is being routed to the following provider(s):  Walden Field, NP

## 2021-02-21 NOTE — Telephone Encounter (Signed)
Lmom for pt to call me back. 

## 2021-02-21 NOTE — Patient Instructions (Addendum)
[place]   Patient Name:  [name] Date of procedure: 04/10/2021 Time to register at [place] Stay: 8:00 AM Provider:  Dr. Abbey Chatters  Please notify us immediately if you are diabetic, take iron supplements, or if you are on coumadin or any blood thinners.  Please hold the following medications: n/a  Note: Do NOT refrigerate or freeze CLENPIQ. CLENPIQ is ready to drink. There is no need to add any other liquid or mix the medicine in the bottle before you start dosing.   04/09/2021-  1 Day prior to procedure:  Monday   CLEAR LIQUIDS ALL DAY--NO SOLID FOODS OR DAIRY PRODUCTS! See list of liquids that are allowed and items that are NOT allowed below.   Diabetic Medication Instructions:  n/a   You must drink plenty of CLEAR LIQUIDS starting before your bowel prep. It is important to stay adequately hydrated before, during, and after your bowel prep for the prep to work effectively!   At 4:00 PM Begin the prep as follows:    1. Drink one bottle of pre-mixed CLENPIQ right from the bottle.  2. Drink at least five (5) 8-ounce drinks of clear liquids of your choice within the next 5 hours   At 10:00 PM: 1. Drink the second bottle of pre-mixed CLENPIQ right from the bottle.   2. Drink at least three (3) 8-ounce drinks of clear liquids of your choice within the next 3 hours before going to bed.   Continue clear liquids until 4 hours before your procedure.  Nothing by mouth after 5:30 AM on 04/13/2021.    04/10/2021-  Day of Procedure: Tuesday   Diabetic medications adjustments: n/a   You may take TYLENOL products.  Please continue your regular medications unless we have instructed you otherwise.     At 4 hours before procedure @ 5:30 am: Stop drinking all liquids, nothing by mouth at this point.   Please note, on the day of your procedure you MUST be accompanied by an adult who is willing to assume responsibility for you at time of discharge. If you  do not have such person with you, your procedure will have to be rescheduled.                                                                                                                     Please leave ALL jewelry at home prior to coming to the hospital for your procedure.   *It is your responsibility to check with your insurance company for the benefits of coverage you have for this procedure. Unfortunately, not all insurance companies have benefits to cover all or part of these types of procedures. It is your responsibility to check your benefits, however we will be glad to assist you with any codes your insurance company may need.   Please note that most insurance companies will not cover a screening colonoscopy for people under the age of 42  For example, with some insurance companies you may  have benefits for a screening colonoscopy, but if polyps are found the diagnosis will change and then you may have a deductible that will need to be met. Please make sure you check your benefits for screening colonoscopy as well as a diagnostic colonoscopy.    CLEAR LIQUIDS: (NO RED or PURPLE) Water  Jello   Apple Juice  White Grape Juice   Kool-Aid Soft drinks  Banana popsicles Sports Drink  Black coffee (No cream or milk) Tea (No cream or milk)  Broth (fat free beef/chicken/vegetable)  Clear liquids allow you to see your fingers on the other side of the glass.  Be sure they are NOT RED or PURPLE in color, cloudy, but CLEAR.  Do Not Eat: Dairy products of any kind Cranberry juice Tomato or V8 Juice  Orange Juice   Grapefruit Juice Red Grape Juice Alcohol   Non-dairy creamer Solid foods like cereal, oatmeal, yogurt, fruits, vegetables, creamed soups, eggs, bread, etc   HELPFUL HINTS TO MAKE DRINKING EASIER: -Trying drinking through a straw. -If you become nauseated, try consuming smaller amounts or stretch out the time between glasses.  Stop for 30 minutes & slowly start back  drinking.  Call our office with any questions or concerns at [phone].  Thank You,  Christ Kick, [place]

## 2021-02-23 ENCOUNTER — Encounter: Payer: Self-pay | Admitting: *Deleted

## 2021-02-23 ENCOUNTER — Telehealth: Payer: Self-pay | Admitting: *Deleted

## 2021-02-23 NOTE — Telephone Encounter (Signed)
Spoke with pt.  She is aware that we need to change her procedure date to a Tuesday since she is an ASA III and will need to be done in the OR.  She voiced understanding and scheduled her procedure for 04/10/2021.  She is aware that I will be mailing out new prep instructions.

## 2021-02-23 NOTE — Telephone Encounter (Signed)
Spoke to [employer] at [place] .  She informed me that colonoscopies are covered starting at age 45. REF#: [id number]  Spoke to [place] at [facility].  He informed me that no prior authorization is required and there is no age exclusion so pt should be ok to proceed with procedure. REF#: [id number]

## 2021-02-28 ENCOUNTER — Telehealth: Payer: Self-pay | Admitting: Orthopaedic Surgery

## 2021-02-28 MED ORDER — ALLOPURINOL 300 MG PO TABS: 300.0000 mg | ORAL_TABLET | Freq: Every day | ORAL | 5 refills | Status: DC

## 2021-02-28 NOTE — Telephone Encounter (Signed)
Patient requests Allopurinol 300 mgs.  Qty  30  Sig: Take 1 tablet (300 mg total) by mouth daily.  Patient uses [facility] on [address]

## 2021-03-29 ENCOUNTER — Other Ambulatory Visit: Payer: Self-pay | Admitting: Family Medicine

## 2021-03-29 MED ORDER — ATORVASTATIN CALCIUM 40 MG PO TABS: 40.0000 mg | ORAL_TABLET | Freq: Every day | ORAL | 0 refills | Status: DC

## 2021-04-03 NOTE — Patient Instructions (Signed)
[place]  04/03/2021     @PREFPERIOPPHARMACY @   Your procedure is scheduled on  04/10/2021.   Report to [facility] at  0730  A.M.   Call this number if you have problems the morning of surgery:  [phone]   Remember:  Follow the diet and prep instructions given to you by the office.                    Take these medicines the morning of surgery with A SIP OF WATER  Allopurinol, amlodipine.     Please brush your teeth.  Do not wear jewelry, make-up or nail polish.  Do not wear lotions, powders, or perfumes, or deodorant.  Do not shave 48 hours prior to surgery.  Men may shave face and neck.  Do not bring valuables to the hospital.  [facility] is not responsible for any belongings or valuables.  Contacts, dentures or bridgework may not be worn into surgery.  Leave your suitcase in the car.  After surgery it may be brought to your room.  For patients admitted to the hospital, discharge time will be determined by your treatment team.  Patients discharged the day of surgery will not be allowed to drive home and must have someone with them for 24 hours.    Special instructions:  DO NOT smoke tobacco or vape for 24 hours before your procedure.  Please read over the following fact sheets that you were given. Anesthesia Post-op Instructions and Care and Recovery After Surgery       Colonoscopy, Adult, Care After This sheet gives you information about how to care for yourself after your procedure. Your health care provider may also give you more specific instructions. If you have problems or questions, contact your health care provider. What can I expect after the procedure? After the procedure, it is common to have:  A small amount of blood in your stool for 24 hours after the procedure.  Some gas.  Mild cramping or bloating of your abdomen. Follow these instructions at home: Eating and drinking  Drink enough fluid to keep your urine pale  yellow.  Follow instructions from your health care provider about eating or drinking restrictions.  Resume your normal diet as instructed by your health care provider. Avoid heavy or fried foods that are hard to digest.   Activity  Rest as told by your health care provider.  Avoid sitting for a long time without moving. Get up to take short walks every 1-2 hours. This is important to improve blood flow and breathing. Ask for help if you feel weak or unsteady.  Return to your normal activities as told by your health care provider. Ask your health care provider what activities are safe for you. Managing cramping and bloating  Try walking around when you have cramps or feel bloated.  Apply heat to your abdomen as told by your health care provider. Use the heat source that your health care provider recommends, such as a moist heat pack or a heating pad. ? Place a towel between your skin and the heat source. ? Leave the heat on for 20-30 minutes. ? Remove the heat if your skin turns bright red. This is especially important if you are unable to feel pain, heat, or cold. You may have a greater risk of getting burned.   General instructions  If you were given a sedative during the procedure, it can affect you  for several hours. Do not drive or operate machinery until your health care provider says that it is safe.  For the first 24 hours after the procedure: ? Do not sign important documents. ? Do not drink alcohol. ? Do your regular daily activities at a slower pace than normal. ? Eat soft foods that are easy to digest.  Take over-the-counter and prescription medicines only as told by your health care provider.  Keep all follow-up visits as told by your health care provider. This is important. Contact a health care provider if:  You have blood in your stool 2-3 days after the procedure. Get help right away if you have:  More than a small spotting of blood in your stool.  Large blood  clots in your stool.  Swelling of your abdomen.  Nausea or vomiting.  A fever.  Increasing pain in your abdomen that is not relieved with medicine. Summary  After the procedure, it is common to have a small amount of blood in your stool. You may also have mild cramping and bloating of your abdomen.  If you were given a sedative during the procedure, it can affect you for several hours. Do not drive or operate machinery until your health care provider says that it is safe.  Get help right away if you have a lot of blood in your stool, nausea or vomiting, a fever, or increased pain in your abdomen. This information is not intended to replace advice given to you by your health care provider. Make sure you discuss any questions you have with your health care provider. Document Revised: 10/08/2019 Document Reviewed: 05/10/2019 Elsevier Patient Education  2021 [employer] After This sheet gives you information about how to care for yourself after your procedure. Your health care provider may also give you more specific instructions. If you have problems or questions, contact your health care provider. What can I expect after the procedure? After the procedure, it is common to have:  Tiredness.  Forgetfulness about what happened after the procedure.  Impaired judgment for important decisions.  Nausea or vomiting.  Some difficulty with balance. Follow these instructions at home: For the time period you were told by your health care provider:  Rest as needed.  Do not participate in activities where you could fall or become injured.  Do not drive or use machinery.  Do not drink alcohol.  Do not take sleeping pills or medicines that cause drowsiness.  Do not make important decisions or sign legal documents.  Do not take care of children on your own.      Eating and drinking  Follow the diet that is recommended by your health care  provider.  Drink enough fluid to keep your urine pale yellow.  If you vomit: ? Drink water, juice, or soup when you can drink without vomiting. ? Make sure you have little or no nausea before eating solid foods. General instructions  Have a responsible adult stay with you for the time you are told. It is important to have someone help care for you until you are awake and alert.  Take over-the-counter and prescription medicines only as told by your health care provider.  If you have sleep apnea, surgery and certain medicines can increase your risk for breathing problems. Follow instructions from your health care provider about wearing your sleep device: ? Anytime you are sleeping, including during daytime naps. ? While taking prescription pain medicines, sleeping medicines, or medicines that  make you drowsy.  Avoid smoking.  Keep all follow-up visits as told by your health care provider. This is important. Contact a health care provider if:  You keep feeling nauseous or you keep vomiting.  You feel light-headed.  You are still sleepy or having trouble with balance after 24 hours.  You develop a rash.  You have a fever.  You have redness or swelling around the IV site. Get help right away if:  You have trouble breathing.  You have new-onset confusion at home. Summary  For several hours after your procedure, you may feel tired. You may also be forgetful and have poor judgment.  Have a responsible adult stay with you for the time you are told. It is important to have someone help care for you until you are awake and alert.  Rest as told. Do not drive or operate machinery. Do not drink alcohol or take sleeping pills.  Get help right away if you have trouble breathing, or if you suddenly become confused. This information is not intended to replace advice given to you by your health care provider. Make sure you discuss any questions you have with your health care  provider. Document Revised: 06/29/2020 Document Reviewed: 09/16/2019 Elsevier Patient Education  2021 [employer].

## 2021-04-06 ENCOUNTER — Emergency Department (HOSPITAL_COMMUNITY): Payer: BC Managed Care – PPO

## 2021-04-06 ENCOUNTER — Other Ambulatory Visit: Payer: Self-pay

## 2021-04-06 ENCOUNTER — Emergency Department (HOSPITAL_COMMUNITY)
Admission: EM | Admit: 2021-04-06 | Discharge: 2021-04-06 | Disposition: A | Discharge status: Home / Self Care | Payer: BC Managed Care – PPO | Attending: Emergency Medicine | Admitting: Emergency Medicine

## 2021-04-06 ENCOUNTER — Ambulatory Visit
Admission: EM | Admit: 2021-04-06 | Discharge: 2021-04-06 | Disposition: A | Discharge status: Home / Self Care | Payer: BC Managed Care – PPO

## 2021-04-06 ENCOUNTER — Encounter (HOSPITAL_COMMUNITY)
Admission: RE | Admit: 2021-04-06 | Discharge: 2021-04-06 | Disposition: A | Discharge status: Home / Self Care | Payer: BC Managed Care – PPO | Source: Ambulatory Visit | Attending: Internal Medicine | Admitting: Internal Medicine

## 2021-04-06 ENCOUNTER — Encounter (HOSPITAL_COMMUNITY): Payer: Self-pay | Admitting: Emergency Medicine

## 2021-04-06 ENCOUNTER — Other Ambulatory Visit: Payer: Self-pay | Admitting: *Deleted

## 2021-04-06 ENCOUNTER — Other Ambulatory Visit (HOSPITAL_COMMUNITY): Payer: BC Managed Care – PPO | Attending: Internal Medicine

## 2021-04-06 ENCOUNTER — Encounter: Payer: Self-pay | Admitting: Family Medicine

## 2021-04-06 ENCOUNTER — Encounter (HOSPITAL_COMMUNITY): Payer: Self-pay

## 2021-04-06 DIAGNOSIS — Z20822 Contact with and (suspected) exposure to covid-19: Secondary | ICD-10-CM | POA: Diagnosis not present

## 2021-04-06 DIAGNOSIS — R059 Cough, unspecified: Secondary | ICD-10-CM | POA: Diagnosis not present

## 2021-04-06 DIAGNOSIS — I10 Essential (primary) hypertension: Secondary | ICD-10-CM | POA: Diagnosis not present

## 2021-04-06 DIAGNOSIS — R0600 Dyspnea, unspecified: Secondary | ICD-10-CM | POA: Insufficient documentation

## 2021-04-06 DIAGNOSIS — R072 Precordial pain: Secondary | ICD-10-CM | POA: Diagnosis present

## 2021-04-06 DIAGNOSIS — R079 Chest pain, unspecified: Secondary | ICD-10-CM

## 2021-04-06 DIAGNOSIS — Z751 Person awaiting admission to adequate facility elsewhere: Secondary | ICD-10-CM

## 2021-04-06 DIAGNOSIS — Z79899 Other long term (current) drug therapy: Secondary | ICD-10-CM | POA: Diagnosis not present

## 2021-04-06 DIAGNOSIS — J4520 Mild intermittent asthma, uncomplicated: Secondary | ICD-10-CM

## 2021-04-06 HISTORY — DX: Gout, unspecified: M10.9

## 2021-04-06 LAB — BASIC METABOLIC PANEL
Anion gap: 7 (ref 5–15)
BUN: 16 mg/dL (ref 6–20)
CO2: 28 mmol/L (ref 22–32)
Calcium: 9.1 mg/dL (ref 8.9–10.3)
Chloride: 101 mmol/L (ref 98–111)
Creatinine, Ser: 1.1 mg/dL — ABNORMAL HIGH (ref 0.44–1.00)
GFR, Estimated: >60 mL/min (ref >60)
Glucose, Bld: 93 mg/dL (ref 70–99)
Potassium: 3.4 mmol/L — ABNORMAL LOW (ref 3.5–5.1)
Sodium: 136 mmol/L (ref 135–145)

## 2021-04-06 LAB — CBC WITH DIFFERENTIAL/PLATELET
Abs Immature Granulocytes: 0.02 10*3/uL (ref 0.00–0.07)
Basophils Absolute: 0 10*3/uL (ref 0.0–0.1)
Basophils Relative: 0 %
Eosinophils Absolute: 0.1 10*3/uL (ref 0.0–0.5)
Eosinophils Relative: 1 %
HCT: 43.1 % (ref 36.0–46.0)
Hemoglobin: 14.2 g/dL (ref 12.0–15.0)
Immature Granulocytes: 0 %
Lymphocytes Relative: 30 %
Lymphs Abs: 2.7 10*3/uL (ref 0.7–4.0)
MCH: 29.9 pg (ref 26.0–34.0)
MCHC: 32.9 g/dL (ref 30.0–36.0)
MCV: 90.7 fL (ref 80.0–100.0)
Monocytes Absolute: 0.6 10*3/uL (ref 0.1–1.0)
Monocytes Relative: 6 %
Neutro Abs: 5.6 10*3/uL (ref 1.7–7.7)
Neutrophils Relative %: 63 %
Platelets: 209 10*3/uL (ref 150–400)
RBC: 4.75 MIL/uL (ref 3.87–5.11)
RDW: 13.4 % (ref 11.5–15.5)
WBC: 9.1 10*3/uL (ref 4.0–10.5)
nRBC: 0 % (ref 0.0–0.2)

## 2021-04-06 LAB — I-STAT CHEM 8, ED
BUN: 14 mg/dL (ref 6–20)
Calcium, Ion: 1.26 mmol/L (ref 1.15–1.40)
Chloride: 103 mmol/L (ref 98–111)
Creatinine, Ser: 1.1 mg/dL — ABNORMAL HIGH (ref 0.44–1.00)
Glucose, Bld: 87 mg/dL (ref 70–99)
HCT: 40 % (ref 36.0–46.0)
Hemoglobin: 13.6 g/dL (ref 12.0–15.0)
Potassium: 3.3 mmol/L — ABNORMAL LOW (ref 3.5–5.1)
Sodium: 139 mmol/L (ref 135–145)
TCO2: 26 mmol/L (ref 22–32)

## 2021-04-06 LAB — URINALYSIS, ROUTINE W REFLEX MICROSCOPIC
Bilirubin Urine: NEGATIVE
Glucose, UA: NEGATIVE mg/dL
Hgb urine dipstick: NEGATIVE
Ketones, ur: NEGATIVE mg/dL
Leukocytes,Ua: NEGATIVE
Nitrite: NEGATIVE
Protein, ur: NEGATIVE mg/dL
Specific Gravity, Urine: 1.015 (ref 1.005–1.030)
pH: 7 (ref 5.0–8.0)

## 2021-04-06 LAB — RESP PANEL BY RT-PCR (FLU A&B, COVID) ARPGX2
Influenza A by PCR: NEGATIVE
Influenza B by PCR: NEGATIVE
SARS Coronavirus 2 by RT PCR: NEGATIVE

## 2021-04-06 LAB — I-STAT BETA HCG BLOOD, ED (MC, WL, AP ONLY): I-stat hCG, quantitative: <5 m[IU]/mL (ref <5)

## 2021-04-06 LAB — TROPONIN I (HIGH SENSITIVITY): Troponin I (High Sensitivity): 2 ng/L (ref <18)

## 2021-04-06 MED ORDER — ASPIRIN 81 MG PO CHEW
324.0000 mg | CHEWABLE_TABLET | Freq: Once | ORAL | Status: AC
  Administered 2021-04-06: 324 mg via ORAL
  Filled 2021-04-06: qty 4

## 2021-04-06 MED ORDER — ALBUTEROL SULFATE HFA 108 (90 BASE) MCG/ACT IN AERS: 1.0000 | INHALATION_SPRAY | RESPIRATORY_TRACT | 2 refills | Status: DC | PRN

## 2021-04-06 NOTE — ED Notes (Signed)
Patient is being discharged from the Urgent Care and sent to the Emergency Department via pov . Per [place], [place] , patient is in need of higher level of care due to chest pressure . Patient is aware and verbalizes understanding of plan of care.  Vitals:   04/06/21 0908  BP: 120/83  Pulse: 60  Resp: 19  Temp: 98.2 F (36.8 C)  SpO2: 98%
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
## 2021-04-06 NOTE — Telephone Encounter (Signed)
Pt advised to go to ER or urgent care due to chest pain started 3 days ago. She did have appt with dr Moshe Cipro in 2 weeks will move up appt to next week

## 2021-04-06 NOTE — Pre-Procedure Instructions (Signed)
Patient in ED for c/o CP this morning. Dr Abbey Chatters notified and will access if procedure need to be postponed after ED visit id complete.

## 2021-04-06 NOTE — ED Triage Notes (Signed)
Chest pressure all over chest mostly in the center on and off x 3 days.  Took some gas medicine for last 2 days that helped some but pain came back.

## 2021-04-06 NOTE — Discharge Instructions (Addendum)
Your workup was overall reassuring in the ED today. Please follow up with your PCP for further evaluation.  I would recommend Ibuprofen and Tylenol as needed for pain.   Return to the ED for any new/worsening symptoms

## 2021-04-09 ENCOUNTER — Encounter (HOSPITAL_COMMUNITY): Payer: Self-pay

## 2021-04-09 ENCOUNTER — Other Ambulatory Visit: Payer: Self-pay

## 2021-04-10 ENCOUNTER — Encounter (HOSPITAL_COMMUNITY)
Admission: RE | Disposition: A | Discharge status: Home / Self Care | Payer: Self-pay | Source: Home / Self Care | Attending: Internal Medicine

## 2021-04-10 ENCOUNTER — Ambulatory Visit (HOSPITAL_COMMUNITY): Payer: BC Managed Care – PPO | Admitting: Certified Registered Nurse Anesthetist

## 2021-04-10 ENCOUNTER — Encounter (HOSPITAL_COMMUNITY): Payer: Self-pay

## 2021-04-10 ENCOUNTER — Ambulatory Visit (HOSPITAL_COMMUNITY)
Admission: RE | Admit: 2021-04-10 | Discharge: 2021-04-10 | Disposition: A | Discharge status: Home / Self Care | Payer: BC Managed Care – PPO | Attending: Internal Medicine | Admitting: Internal Medicine

## 2021-04-10 DIAGNOSIS — Z6841 Body Mass Index (BMI) 40.0 and over, adult: Secondary | ICD-10-CM | POA: Diagnosis not present

## 2021-04-10 DIAGNOSIS — K648 Other hemorrhoids: Secondary | ICD-10-CM | POA: Insufficient documentation

## 2021-04-10 DIAGNOSIS — Z885 Allergy status to narcotic agent status: Secondary | ICD-10-CM | POA: Insufficient documentation

## 2021-04-10 DIAGNOSIS — Z1211 Encounter for screening for malignant neoplasm of colon: Secondary | ICD-10-CM | POA: Insufficient documentation

## 2021-04-10 DIAGNOSIS — Z79899 Other long term (current) drug therapy: Secondary | ICD-10-CM | POA: Diagnosis not present

## 2021-04-10 HISTORY — PX: COLONOSCOPY WITH PROPOFOL: SHX5780

## 2021-04-10 SURGERY — COLONOSCOPY WITH PROPOFOL
Anesthesia: General

## 2021-04-10 MED ORDER — PROPOFOL 500 MG/50ML IV EMUL
INTRAVENOUS | Status: DC | PRN
  Administered 2021-04-10: 150 ug/kg/min via INTRAVENOUS

## 2021-04-10 MED ORDER — LACTATED RINGERS IV SOLN: INTRAVENOUS | Status: DC

## 2021-04-10 MED ORDER — PROPOFOL 10 MG/ML IV BOLUS
INTRAVENOUS | Status: DC | PRN
  Administered 2021-04-10: 100 mg via INTRAVENOUS

## 2021-04-10 NOTE — Op Note (Signed)
[facility] Patient Name: [name] Procedure Date: 04/10/2021 9:44 AM MRN: [id number] Date of Birth: [birth year] Attending MD: Elon Alas. Abbey Chatters DO CSN: [id number] Age: 45 Admit Type: Outpatient Procedure:                Colonoscopy Indications:              Screening for colorectal malignant neoplasm Providers:                Elon Alas. Rainer Mounce, DO, Otis Peak B. Sharon Seller, RN,                            Randa Spike, Technician Referring MD:              Medicines:                See the Anesthesia note for documentation of the                            administered medications Complications:            No immediate complications. Estimated Blood Loss:     Estimated blood loss: none. Procedure:                Pre-Anesthesia Assessment:                           - The anesthesia plan was to use monitored                            anesthesia care (MAC).                           After obtaining informed consent, the colonoscope                            was passed under direct vision. Throughout the                            procedure, the patient's blood pressure, pulse, and                            oxygen saturations were monitored continuously. The                            [id number]-[id number] ([id number]) scope was introduced through                            the anus and advanced to the the cecum, identified                            by appendiceal orifice and ileocecal valve. The                            colonoscopy was performed without difficulty. The                            patient tolerated the procedure well.  The quality                            of the bowel preparation was evaluated using the                            BBPS [facility] Bowel Preparation Scale) with scores                            of: Right Colon = 3, Transverse Colon = 3 and Left                            Colon = 3 (entire mucosa seen well with no residual                             staining, small fragments of stool or opaque                            liquid). The total BBPS score equals 9. Scope In: 9:58:54 AM Scope Out: 10:09:38 AM Scope Withdrawal Time: 0 hours 7 minutes 18 seconds  Total Procedure Duration: 0 hours 10 minutes 44 seconds  Findings:      The perianal and digital rectal examinations were normal.      Non-bleeding internal hemorrhoids were found during endoscopy.      The entire examined colon appeared normal. Impression:               - Non-bleeding internal hemorrhoids.                           - The entire examined colon is normal.                           - No specimens collected. Moderate Sedation:      Per Anesthesia Care Recommendation:           - Patient has a contact number available for                            emergencies. The signs and symptoms of potential                            delayed complications were discussed with the                            patient. Return to normal activities tomorrow.                            Written discharge instructions were provided to the                            patient.                           - Resume previous diet.                           -  Continue present medications.                           - Repeat colonoscopy in 10 years for screening                            purposes.                           - Return to GI clinic PRN. Procedure Code(s):        --- Professional ---                           L5449, Colorectal cancer screening; colonoscopy on                            individual not meeting criteria for high risk Diagnosis Code(s):        --- Professional ---                           Z12.11, Encounter for screening for malignant                            neoplasm of colon                           K64.8, Other hemorrhoids CPT copyright 2019 [employer]. All rights reserved. The codes documented in this report are preliminary and upon coder review  may  be revised to meet current compliance requirements. Elon Alas. Abbey Chatters, DO [place] Abbey Chatters, DO 04/10/2021 10:11:37 AM This report has been signed electronically. Number of Addenda: 0

## 2021-04-10 NOTE — Discharge Instructions (Signed)
°  Colonoscopy °Discharge Instructions ° °Read the instructions outlined below and refer to this sheet in the next few weeks. These discharge instructions provide you with general information on caring for yourself after you leave the hospital. Your doctor may also give you specific instructions. While your treatment has been planned according to the most current medical practices available, unavoidable complications occasionally occur.  ° °ACTIVITY °You may resume your regular activity, but move at a slower pace for the next 24 hours.  °Take frequent rest periods for the next 24 hours.  °Walking will help get rid of the air and reduce the bloated feeling in your belly (abdomen).  °No driving for 24 hours (because of the medicine (anesthesia) used during the test).   °Do not sign any important legal documents or operate any machinery for 24 hours (because of the anesthesia used during the test).  °NUTRITION °Drink plenty of fluids.  °You may resume your normal diet as instructed by your doctor.  °Begin with a light meal and progress to your normal diet. Heavy or fried foods are harder to digest and may make you feel sick to your stomach (nauseated).  °Avoid alcoholic beverages for 24 hours or as instructed.  °MEDICATIONS °You may resume your normal medications unless your doctor tells you otherwise.  °WHAT YOU CAN EXPECT TODAY °Some feelings of bloating in the abdomen.  °Passage of more gas than usual.  °Spotting of blood in your stool or on the toilet paper.  °IF YOU HAD POLYPS REMOVED DURING THE COLONOSCOPY: °No aspirin products for 7 days or as instructed.  °No alcohol for 7 days or as instructed.  °Eat a soft diet for the next 24 hours.  °FINDING OUT THE RESULTS OF YOUR TEST °Not all test results are available during your visit. If your test results are not back during the visit, make an appointment with your caregiver to find out the results. Do not assume everything is normal if you have not heard from your  caregiver or the medical facility. It is important for you to follow up on all of your test results.  °SEEK IMMEDIATE MEDICAL ATTENTION IF: °You have more than a spotting of blood in your stool.  °Your belly is swollen (abdominal distention).  °You are nauseated or vomiting.  °You have a temperature over 101.  °You have abdominal pain or discomfort that is severe or gets worse throughout the day.  ° °Your colonoscopy was relatively unremarkable.  I did not find any polyps or evidence of colon cancer.  I recommend repeating colonoscopy in 10 years for colon cancer screening purposes.   Follow-up with GI as needed. ° ° °I hope you have a great rest of your week! ° °Mandee Pluta K. Kayona Foor, D.O. °Gastroenterology and Hepatology °[facility] ° °

## 2021-04-10 NOTE — Anesthesia Preprocedure Evaluation (Signed)
Anesthesia Evaluation  Patient identified by MRN, date of birth, ID band Patient awake    Reviewed: Allergy & Precautions, H&P , NPO status , Patient's Chart, lab work & pertinent test results, reviewed documented beta blocker date and time   Airway Mallampati: II  TM Distance: >3 FB Neck ROM: full    Dental no notable dental hx.    Pulmonary neg pulmonary ROS,    Pulmonary exam normal breath sounds clear to auscultation       Cardiovascular Exercise Tolerance: Good hypertension, negative cardio ROS   Rhythm:regular Rate:Normal     Neuro/Psych  Neuromuscular disease negative psych ROS   GI/Hepatic negative GI ROS, Neg liver ROS,   Endo/Other  Morbid obesity  Renal/GU negative Renal ROS  negative genitourinary   Musculoskeletal   Abdominal   Peds  Hematology negative hematology ROS (+)   Anesthesia Other Findings   Reproductive/Obstetrics negative OB ROS                             Anesthesia Physical Anesthesia Plan  ASA: 2  Anesthesia Plan: General   Post-op Pain Management:    Induction:   PONV Risk Score and Plan: Propofol infusion  Airway Management Planned:   Additional Equipment:   Intra-op Plan:   Post-operative Plan:   Informed Consent: I have reviewed the patients History and Physical, chart, labs and discussed the procedure including the risks, benefits and alternatives for the proposed anesthesia with the patient or authorized representative who has indicated his/her understanding and acceptance.     Dental Advisory Given  Plan Discussed with: CRNA  Anesthesia Plan Comments:         Anesthesia Quick Evaluation

## 2021-04-10 NOTE — Anesthesia Postprocedure Evaluation (Signed)
Anesthesia Post Note  Patient: [name]  Procedure(s) Performed: COLONOSCOPY WITH PROPOFOL  Patient location during evaluation: Phase II Anesthesia Type: General Level of consciousness: awake Pain management: pain level controlled Vital Signs Assessment: post-procedure vital signs reviewed and stable Respiratory status: spontaneous breathing and respiratory function stable Cardiovascular status: blood pressure returned to baseline and stable Postop Assessment: no headache and no apparent nausea or vomiting Anesthetic complications: no Comments: Late entry   No notable events documented.   Last Vitals:  Vitals:   04/10/21 0828 04/10/21 1014  BP: (!) 141/86 114/68  Pulse: (!) 57 67  Resp: 16 (!) 27  Temp: 36.7 C 36.4 C  SpO2: 100% 100%    Last Pain:  Vitals:   04/10/21 1014  TempSrc: Oral  PainSc: 0-No pain                 [name]

## 2021-04-10 NOTE — Transfer of Care (Signed)
Immediate Anesthesia Transfer of Care Note  Patient: [name]  Procedure(s) Performed: COLONOSCOPY WITH PROPOFOL  Patient Location: PACU and Short Stay  Anesthesia Type:General  Level of Consciousness: drowsy  Airway & Oxygen Therapy: Patient Spontanous Breathing  Post-op Assessment: Report given to RN and Post -op Vital signs reviewed and stable  Post vital signs: Reviewed and stable  Last Vitals:  Vitals Value Taken Time  BP    Temp    Pulse    Resp    SpO2      Last Pain:  Vitals:   04/10/21 0956  TempSrc:   PainSc: 0-No pain      Patients Stated Pain Goal: 5 (27/07/86 7544)  Complications: No notable events documented.

## 2021-04-10 NOTE — H&P (Signed)
Primary Care Physician:  Fayrene Helper, MD Primary Gastroenterologist:  Dr. Abbey Chatters  Pre-Procedure History & Physical: HPI:  [name] is a 45 y.o. female is here for a colonoscopy for colon cancer screening purposes.  Patient denies any family history of colorectal cancer.  No melena or hematochezia.  No abdominal pain or unintentional weight loss.  No change in bowel habits.  Overall feels well from a GI standpoint.  Past Medical History:  Diagnosis Date   Allergic rhinitis    Gout    Hyperlipidemia    Hypertension    Lumbar disc disease    Obesity    Syncope and collapse 09/27/2014   associated with over corrected htn, had cardiac and neurologic eval in 2015    Past Surgical History:  Procedure Laterality Date   CESAREAN SECTION     CHOLECYSTECTOMY     LAPAROSCOPIC GASTRIC BANDING  08/18/2010    Prior to Admission medications   Medication Sig Start Date End Date Taking? Authorizing Provider  albuterol (VENTOLIN HFA) 108 (90 Base) MCG/ACT inhaler Inhale 1-2 puffs into the lungs every 4 (four) hours as needed for wheezing or shortness of breath. 04/06/21  Yes Fayrene Helper, MD  allopurinol (ZYLOPRIM) 300 MG tablet Take 1 tablet (300 mg total) by mouth daily. Patient taking differently: Take 300 mg by mouth in the morning. 02/28/21  Yes Sanjuana Kava, MD  amLODipine (NORVASC) 5 MG tablet TAKE 1 TABLET(5 MG) BY MOUTH DAILY Patient taking differently: Take 5 mg by mouth in the morning. 01/22/21  Yes Fayrene Helper, MD  atorvastatin (LIPITOR) 40 MG tablet Take 1 tablet (40 mg total) by mouth daily. Patient taking differently: Take 40 mg by mouth in the morning. 03/29/21  Yes Fayrene Helper, MD  fluticasone (FLONASE) 50 MCG/ACT nasal spray Place 1 spray into both nostrils 2 (two) times daily. Patient taking differently: Place 1 spray into both nostrils 2 (two) times daily as needed for allergies. 10/11/19  Yes Fayrene Helper, MD  Multiple Vitamin  (MULTIVITAMIN WITH MINERALS) TABS tablet Take 1 tablet by mouth every evening.   Yes [provider]  triamterene-hydrochlorothiazide (MAXZIDE) 75-50 MG tablet Take 1 tablet by mouth daily. Patient taking differently: Take 1 tablet by mouth in the morning. 01/30/21  Yes Fayrene Helper, MD    Allergies as of 02/22/2021 - Review Complete 02/21/2021  Allergen Reaction Noted   Hydrocodone-acetaminophen Other (See Comments) 03/06/2009   Hydrocodone-acetaminophen Other (See Comments) 10/19/2020    Family History  Problem Relation Age of Onset   Hypertension Mother    Hyperlipidemia Mother    Diabetes Mother    Cancer Mother        breast    Obesity Mother    Hypertension Brother    Stroke Brother    Heart disease Father    Heart attack Father    Alcohol abuse Maternal Aunt    Drug abuse Maternal Uncle     Social History   Socioeconomic History   Marital status: Married    Spouse name: Not on file   Number of children: 2   Years of education: college   Highest education level: Not on file  Occupational History   Not on file  Tobacco Use   Smoking status: Never   Smokeless tobacco: Never  Vaping Use   Vaping Use: Never used  Substance and Sexual Activity   Alcohol use: No    Alcohol/week: 0.0 standard drinks   Drug use: Never  Sexual activity: Yes    Birth control/protection: None  Other Topics Concern   Not on file  Social History Narrative   Patient is married and lives at home with her husband.    Social Determinants of Health   Financial Resource Strain: Not on file  Food Insecurity: Not on file  Transportation Needs: Not on file  Physical Activity: Not on file  Stress: Not on file  Social Connections: Not on file  Intimate Partner Violence: Not on file    Review of Systems: See HPI, otherwise negative ROS  Physical Exam: Vital signs in last 24 hours: Temp:  [98.1 F (36.7 C)] 98.1 F (36.7 C) (06/14 0828) Pulse Rate:  [57] 57 (06/14  0828) Resp:  [16] 16 (06/14 0828) BP: (141)/(86) 141/86 (06/14 0828) SpO2:  [100 %] 100 % (06/14 0828)   General:   Alert,  Well-developed, well-nourished, pleasant and cooperative in NAD Head:  Normocephalic and atraumatic. Eyes:  Sclera clear, no icterus.   Conjunctiva pink. Ears:  Normal auditory acuity. Nose:  No deformity, discharge,  or lesions. Mouth:  No deformity or lesions, dentition normal. Neck:  Supple; no masses or thyromegaly. Lungs:  Clear throughout to auscultation.   No wheezes, crackles, or rhonchi. No acute distress. Heart:  Regular rate and rhythm; no murmurs, clicks, rubs,  or gallops. Abdomen:  Soft, nontender and nondistended. No masses, hepatosplenomegaly or hernias noted. Normal bowel sounds, without guarding, and without rebound.   Msk:  Symmetrical without gross deformities. Normal posture. Extremities:  Without clubbing or edema. Neurologic:  Alert and  oriented x4;  grossly normal neurologically. Skin:  Intact without significant lesions or rashes. Cervical Nodes:  No significant cervical adenopathy. Psych:  Alert and cooperative. Normal mood and affect.  Impression/Plan: [name] is here for a colonoscopy to be performed for colon cancer screening purposes.  The risks of the procedure including infection, bleed, or perforation as well as benefits, limitations, alternatives and imponderables have been reviewed with the patient. Questions have been answered. All parties agreeable.

## 2021-04-11 ENCOUNTER — Other Ambulatory Visit: Payer: Self-pay

## 2021-04-11 ENCOUNTER — Other Ambulatory Visit (HOSPITAL_COMMUNITY): Payer: BC Managed Care – PPO

## 2021-04-11 ENCOUNTER — Ambulatory Visit (INDEPENDENT_AMBULATORY_CARE_PROVIDER_SITE_OTHER): Payer: BC Managed Care – PPO | Admitting: Internal Medicine

## 2021-04-11 ENCOUNTER — Encounter: Payer: Self-pay | Admitting: Internal Medicine

## 2021-04-11 VITALS — BP 117/77 | HR 67 | Ht 63.0 in | Wt 236.0 lb

## 2021-04-11 DIAGNOSIS — Z09 Encounter for follow-up examination after completed treatment for conditions other than malignant neoplasm: Secondary | ICD-10-CM | POA: Diagnosis not present

## 2021-04-11 DIAGNOSIS — K219 Gastro-esophageal reflux disease without esophagitis: Secondary | ICD-10-CM

## 2021-04-11 DIAGNOSIS — R079 Chest pain, unspecified: Secondary | ICD-10-CM

## 2021-04-11 DIAGNOSIS — I10 Essential (primary) hypertension: Secondary | ICD-10-CM

## 2021-04-11 MED ORDER — FAMOTIDINE 20 MG PO TABS: 20.0000 mg | ORAL_TABLET | Freq: Two times a day (BID) | ORAL | 0 refills | Status: DC

## 2021-04-11 NOTE — Assessment & Plan Note (Signed)
Exertional chest pain and FH of CAD - would refer to Cardiology for further management (possibly stress test)

## 2021-04-11 NOTE — Assessment & Plan Note (Signed)
BP Readings from Last 1 Encounters:  04/11/21 117/77   Well-controlled Counseled for compliance with the medications Advised DASH diet and moderate exercise/walking, at least 150 mins/week

## 2021-04-11 NOTE — Patient Instructions (Signed)
Please start taking Pepcid as prescribed.  Please avoid hot and spicy food.  Please have dinner at least 2 hours before bedtime.  You are being referred to Cardiology for evaluation of chest pain.

## 2021-04-11 NOTE — Progress Notes (Signed)
Acute Office Visit  Subjective:    Patient ID: [name], female    DOB: 05/06/1976, 45 y.o.   MRN: [id number]  Chief Complaint  Patient presents with   Follow-up    Chest tightness/pressure x5 days, has been using an inhaler with some relief but is still bothering her at night.    HPI Patient is in today for follow up after recent ER visit for chest discomfort/tightness. She had negative cardiac markers and unremarkable EKG. She states that her chest tightness is worse with activity and better with rest. It also improves at times with Albuterol. Her symptoms are not related to food intake. She denies any headache, dizziness or palpitations.  Past Medical History:  Diagnosis Date   Allergic rhinitis    Gout    Hyperlipidemia    Hypertension    Lumbar disc disease    Obesity    Syncope and collapse 09/27/2014   associated with over corrected htn, had cardiac and neurologic eval in 2015    Past Surgical History:  Procedure Laterality Date   CESAREAN SECTION     CHOLECYSTECTOMY     LAPAROSCOPIC GASTRIC BANDING  08/18/2010    Family History  Problem Relation Age of Onset   Hypertension Mother    Hyperlipidemia Mother    Diabetes Mother    Cancer Mother        breast    Obesity Mother    Hypertension Brother    Stroke Brother    Heart disease Father    Heart attack Father    Alcohol abuse Maternal Aunt    Drug abuse Maternal Uncle     Social History   Socioeconomic History   Marital status: Married    Spouse name: Not on file   Number of children: 2   Years of education: college   Highest education level: Not on file  Occupational History   Not on file  Tobacco Use   Smoking status: Never   Smokeless tobacco: Never  Vaping Use   Vaping Use: Never used  Substance and Sexual Activity   Alcohol use: No    Alcohol/week: 0.0 standard drinks   Drug use: Never   Sexual activity: Yes    Birth control/protection: None  Other Topics Concern   Not  on file  Social History Narrative   Patient is married and lives at home with her husband.    Social Determinants of Health   Financial Resource Strain: Not on file  Food Insecurity: Not on file  Transportation Needs: Not on file  Physical Activity: Not on file  Stress: Not on file  Social Connections: Not on file  Intimate Partner Violence: Not on file    Outpatient Medications Prior to Visit  Medication Sig Dispense Refill   albuterol (VENTOLIN HFA) 108 (90 Base) MCG/ACT inhaler Inhale 1-2 puffs into the lungs every 4 (four) hours as needed for wheezing or shortness of breath. 18 g 2   allopurinol (ZYLOPRIM) 300 MG tablet Take 1 tablet (300 mg total) by mouth daily. (Patient taking differently: Take 300 mg by mouth in the morning.) 30 tablet 5   amLODipine (NORVASC) 5 MG tablet TAKE 1 TABLET(5 MG) BY MOUTH DAILY (Patient taking differently: Take 5 mg by mouth in the morning.) 90 tablet 1   atorvastatin (LIPITOR) 40 MG tablet Take 1 tablet (40 mg total) by mouth daily. (Patient taking differently: Take 40 mg by mouth in the morning.) 90 tablet 0   fluticasone (FLONASE)  50 MCG/ACT nasal spray Place 1 spray into both nostrils 2 (two) times daily. (Patient taking differently: Place 1 spray into both nostrils 2 (two) times daily as needed for allergies.) 48 g 0   Multiple Vitamin (MULTIVITAMIN WITH MINERALS) TABS tablet Take 1 tablet by mouth every evening.     triamterene-hydrochlorothiazide (MAXZIDE) 75-50 MG tablet Take 1 tablet by mouth daily. (Patient taking differently: Take 1 tablet by mouth in the morning.) 30 tablet 3   No facility-administered medications prior to visit.    Allergies  Allergen Reactions   Hydrocodone-Acetaminophen Other (See Comments)    Tachycardia    Review of Systems  Constitutional:  Negative for chills and fever.  HENT:  Negative for congestion, sinus pressure, sinus pain and sore throat.   Eyes:  Negative for pain and discharge.  Respiratory:   Positive for chest tightness. Negative for cough and shortness of breath.   Cardiovascular:  Positive for chest pain. Negative for palpitations.  Gastrointestinal:  Negative for abdominal pain, constipation, diarrhea, nausea and vomiting.  Endocrine: Negative for polydipsia and polyuria.  Genitourinary:  Negative for dysuria and hematuria.  Musculoskeletal:  Negative for neck pain and neck stiffness.  Skin:  Negative for rash.  Neurological:  Negative for dizziness and weakness.  Psychiatric/Behavioral:  Negative for agitation and behavioral problems.       Objective:    Physical Exam Vitals reviewed.  Constitutional:      General: She is not in acute distress.    Appearance: She is not diaphoretic.  HENT:     Head: Normocephalic and atraumatic.     Nose: Nose normal.     Mouth/Throat:     Mouth: Mucous membranes are moist.  Eyes:     General: No scleral icterus.    Extraocular Movements: Extraocular movements intact.  Cardiovascular:     Rate and Rhythm: Normal rate and regular rhythm.     Pulses: Normal pulses.     Heart sounds: Normal heart sounds. No murmur heard. Pulmonary:     Breath sounds: Normal breath sounds. No wheezing or rales.  Musculoskeletal:     Cervical back: Neck supple. No tenderness.     Right lower leg: No edema.     Left lower leg: No edema.  Skin:    General: Skin is warm.     Findings: No rash.  Neurological:     General: No focal deficit present.     Mental Status: She is alert and oriented to person, place, and time.  Psychiatric:        Mood and Affect: Mood normal.        Behavior: Behavior normal.    BP 117/77 (BP Location: Right Arm, Patient Position: Sitting, Cuff Size: Large)   Pulse 67   Ht 5\' 3"  (1.6 m)   Wt 236 lb (107 kg)   SpO2 97%   BMI 41.81 kg/m  Wt Readings from Last 3 Encounters:  04/11/21 236 lb (107 kg)  04/06/21 265 lb (120.2 kg)  02/21/21 236 lb (107 kg)    There are no preventive care reminders to display for  this patient.  There are no preventive care reminders to display for this patient.   Lab Results  Component Value Date   TSH 1.140 05/18/2020   Lab Results  Component Value Date   WBC 9.1 04/06/2021   HGB 13.6 04/06/2021   HCT 40.0 04/06/2021   MCV 90.7 04/06/2021   PLT 209 04/06/2021   Lab Results  Component Value Date   NA 139 04/06/2021   K 3.3 (L) 04/06/2021   CO2 28 04/06/2021   GLUCOSE 87 04/06/2021   BUN 14 04/06/2021   CREATININE 1.10 (H) 04/06/2021   BILITOT 0.4 02/15/2020   ALKPHOS 75 11/11/2016   AST 17 02/15/2020   ALT 19 02/15/2020   PROT 7.0 02/15/2020   ALBUMIN 3.8 11/11/2016   CALCIUM 9.1 04/06/2021   ANIONGAP 7 04/06/2021   Lab Results  Component Value Date   CHOL 164 05/18/2020   Lab Results  Component Value Date   HDL 38 (L) 05/18/2020   Lab Results  Component Value Date   LDLCALC 109 (H) 05/18/2020   Lab Results  Component Value Date   TRIG 92 05/18/2020   Lab Results  Component Value Date   CHOLHDL 4.3 05/18/2020   Lab Results  Component Value Date   HGBA1C 5.8 (A) 07/27/2020       Assessment & Plan:   Problem List Items Addressed This Visit       Cardiovascular and Mediastinum   Essential hypertension    BP Readings from Last 1 Encounters:  04/11/21 117/77  Well-controlled Counseled for compliance with the medications Advised DASH diet and moderate exercise/walking, at least 150 mins/week          Other   Chest pain in adult    Exertional chest pain and FH of CAD - would refer to Cardiology for further management (possibly stress test)       Relevant Orders   Ambulatory referral to Cardiology   Other Visit Diagnoses     Encounter for examination following treatment at hospital    -  Primary ER chart reviewed including imaging Negative cardiac marker and EKG showed no signs of active ischemia    Gastroesophageal reflux disease, unspecified whether esophagitis present       Relevant Medications    famotidine (PEPCID) 20 MG tablet        Meds ordered this encounter  Medications   famotidine (PEPCID) 20 MG tablet    Sig: Take 1 tablet (20 mg total) by mouth 2 (two) times daily.    Dispense:  60 tablet    Refill:  0     Keaira Whitehurst Keith Rake, MD

## 2021-04-17 ENCOUNTER — Encounter (HOSPITAL_COMMUNITY): Payer: Self-pay | Admitting: Internal Medicine

## 2021-04-19 ENCOUNTER — Ambulatory Visit: Payer: BC Managed Care – PPO | Admitting: Family Medicine

## 2021-04-27 ENCOUNTER — Other Ambulatory Visit: Payer: Self-pay

## 2021-04-27 ENCOUNTER — Emergency Department (HOSPITAL_COMMUNITY)
Admission: EM | Admit: 2021-04-27 | Discharge: 2021-04-28 | Discharge status: Absent without leave | Payer: BC Managed Care – PPO | Attending: Emergency Medicine | Admitting: Emergency Medicine

## 2021-04-27 MED ORDER — SODIUM CHLORIDE 0.9% FLUSH: 3.0000 mL | Freq: Once | INTRAVENOUS | Status: DC

## 2021-05-02 ENCOUNTER — Telehealth: Payer: Self-pay | Admitting: *Deleted
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
## 2021-05-14 ENCOUNTER — Telehealth: Payer: Self-pay

## 2021-05-14 NOTE — Telephone Encounter (Signed)
Patient called need med refills  triamterene-hydrochlorothiazide (MAXZIDE) 75-50 MG tablet   Pharmacy: [facility]

## 2021-05-15 ENCOUNTER — Other Ambulatory Visit: Payer: Self-pay | Admitting: *Deleted

## 2021-05-15 MED ORDER — TRIAMTERENE-HCTZ 75-50 MG PO TABS: 1.0000 | ORAL_TABLET | Freq: Every day | ORAL | 3 refills | Status: DC

## 2021-05-15 NOTE — Telephone Encounter (Signed)
Pt medication sent to pharmacy  

## 2021-05-22 ENCOUNTER — Other Ambulatory Visit: Payer: Self-pay

## 2021-05-22 ENCOUNTER — Encounter: Payer: Self-pay | Admitting: Family Medicine

## 2021-05-22 ENCOUNTER — Ambulatory Visit: Payer: BC Managed Care – PPO

## 2021-05-22 ENCOUNTER — Ambulatory Visit (INDEPENDENT_AMBULATORY_CARE_PROVIDER_SITE_OTHER): Payer: BC Managed Care – PPO | Admitting: Family Medicine

## 2021-05-22 ENCOUNTER — Ambulatory Visit (HOSPITAL_COMMUNITY)
Admission: RE | Admit: 2021-05-22 | Discharge: 2021-05-22 | Disposition: A | Discharge status: Home / Self Care | Payer: BC Managed Care – PPO | Source: Ambulatory Visit | Attending: Family Medicine | Admitting: Family Medicine

## 2021-05-22 VITALS — BP 138/87 | HR 78 | Resp 15 | Ht 63.0 in | Wt 235.0 lb

## 2021-05-22 DIAGNOSIS — T7840XA Allergy, unspecified, initial encounter: Secondary | ICD-10-CM | POA: Insufficient documentation

## 2021-05-22 DIAGNOSIS — F4321 Adjustment disorder with depressed mood: Secondary | ICD-10-CM

## 2021-05-22 DIAGNOSIS — T7840XD Allergy, unspecified, subsequent encounter: Secondary | ICD-10-CM | POA: Diagnosis not present

## 2021-05-22 DIAGNOSIS — M7989 Other specified soft tissue disorders: Secondary | ICD-10-CM | POA: Diagnosis not present

## 2021-05-22 DIAGNOSIS — E7800 Pure hypercholesterolemia, unspecified: Secondary | ICD-10-CM

## 2021-05-22 DIAGNOSIS — I10 Essential (primary) hypertension: Secondary | ICD-10-CM | POA: Diagnosis not present

## 2021-05-22 DIAGNOSIS — R7301 Impaired fasting glucose: Secondary | ICD-10-CM

## 2021-05-22 DIAGNOSIS — E559 Vitamin D deficiency, unspecified: Secondary | ICD-10-CM

## 2021-05-22 DIAGNOSIS — M1712 Unilateral primary osteoarthritis, left knee: Secondary | ICD-10-CM | POA: Diagnosis not present

## 2021-05-22 MED ORDER — KETOROLAC TROMETHAMINE 60 MG/2ML IM SOLN
60.0000 mg | Freq: Once | INTRAMUSCULAR | Status: AC
  Administered 2021-05-22: 60 mg via INTRAMUSCULAR

## 2021-05-22 MED ORDER — PREDNISONE 5 MG (21) PO TBPK: 5.0000 mg | ORAL_TABLET | ORAL | 0 refills | Status: DC

## 2021-05-22 MED ORDER — METHYLPREDNISOLONE ACETATE 80 MG/ML IJ SUSP: 80.0000 mg | Freq: Once | INTRAMUSCULAR | Status: AC

## 2021-05-22 NOTE — Assessment & Plan Note (Signed)
2 weeks ago, rash and itchy tongue, unknown allergen , refer for testing, no dyspnea

## 2021-05-22 NOTE — Assessment & Plan Note (Signed)
Controlled, no change in medication DASH diet and commitment to daily physical activity for a minimum of 30 minutes discussed and encouraged, as a part of hypertension management. The importance of attaining a healthy weight is also discussed.  BP/Weight 05/22/2021 04/11/2021 04/10/2021 04/06/2021 04/06/2021 02/21/2021 0000000  Systolic BP 0000000 [id number] - [id number]  Diastolic BP 87 77 68 71 83 - 87  Wt. (Lbs) 235 236 - 265 - 236 -  BMI 41.63 41.81 - 46.94 - 43.16 -

## 2021-05-22 NOTE — Assessment & Plan Note (Addendum)
Currently numb having found her only sib dead last night , lost both parents over the past 2 years, encouraged counselling. 12 minutes spent  With [facility] venting

## 2021-05-22 NOTE — Progress Notes (Signed)
   [name]     MRN: [id number]      DOB: 12-May-1976   HPI [name] is here devastated about the unexpected death of her brother who she found in his home yesterday 2 weeksago had rash and scratchy tongue, took benadryl, lasted 5 days,concerned about possible allergic reaction  left knee pain since last Thursday after exercise also left calf pain and swelling, no SOB or hemoptysis ROS Denies recent fever or chills. Denies sinus pressure, nasal congestion, ear pain or sore throat. Denies chest congestion, productive cough or wheezing. Denies chest pains, palpitations  Denies abdominal pain, nausea, vomiting,diarrhea or constipation.   Denies dysuria, frequency, hesitancy or incontinence.  Denies headaches, seizures, numbness, or tingling.  Denies skin break down or rash.   PE  BP 138/87   Pulse 78   Resp 15   Ht '5\' 3"'$  (1.6 m)   Wt 235 lb (106.6 kg)   SpO2 95%   BMI 41.63 kg/m   Patient alert and oriented and in no cardiopulmonary distress.  HEENT: No facial asymmetry, EOMI,     Neck supple .  Chest: Clear to auscultation bilaterally.  CVS: S1, S2 no murmurs, no S3.Regular rate.  ABD: Soft non tender.   Ext: No edema  MS: Adequate ROM spine, shoulders, hips and reduced in left  knee which has crepitus Left calf swollen and tender with positive Hohmann's   Skin: Intact, no ulcerations or rash noted. Sad and tearful, crying. Memory intact not anxious  CNS: CN 2-12 intact, power,  normal throughout.no focal deficits noted.   Assessment & Plan  Osteoarthrosis of knee Uncontrolled.Toradol and depo medrol administered IM in the office , to be followed by a short course of oral prednisone and NSAIDS.   Allergic reaction 2 weeks ago, rash and itchy tongue, unknown allergen , refer for testing, no dyspnea  Left leg swelling 6 day history, [place] to eval for DVT  MORBID OBESITY  Patient re-educated about  the importance of commitment to a  minimum  of 150 minutes of exercise per week as able.  The importance of healthy food choices with portion control discussed, as well as eating regularly and within a 12 hour window most days. The need to choose "clean , green" food 50 to 75% of the time is discussed, as well as to make water the primary drink and set a goal of 64 ounces water daily.    Weight /BMI 05/22/2021 04/11/2021 04/06/2021  WEIGHT 235 lb 236 lb 265 lb  HEIGHT '5\' 3"'$  '5\' 3"'$  '5\' 3"'$   BMI 41.63 kg/m2 41.81 kg/m2 46.94 kg/m2      Essential hypertension Controlled, no change in medication DASH diet and commitment to daily physical activity for a minimum of 30 minutes discussed and encouraged, as a part of hypertension management. The importance of attaining a healthy weight is also discussed.  BP/Weight 05/22/2021 04/11/2021 04/10/2021 04/06/2021 04/06/2021 02/21/2021 0000000  Systolic BP 0000000 [id number]  Diastolic BP 87 77 68 71 83 - 87  Wt. (Lbs) 235 236 - 265 - 236 -  BMI 41.63 41.81 - 46.94 - 43.16 -       Grief Currently numb having found her only sib dead last night , lost both parents over the past 2 years, encouraged counselling

## 2021-05-22 NOTE — Patient Instructions (Signed)
F/U n 8 weeks, call if you need me sooner  Labs today, cBC, lipid, cmp and EGFr, hBA1c, TSH, Vit D and uric acid level  You are referred for [place] of left cal urgently You are referred to allergist  Injections for left knee pain followed by short course of prednisone  Sincere condolence and a lot of prayers  It is important that you exercise regularly at least 30 minutes 5 times a week. If you develop chest pain, have severe difficulty breathing, or feel very tired, stop exercising immediately and seek medical attention   Think about what you will eat, plan ahead. Choose " clean, green, fresh or frozen" over canned, processed or packaged foods which are more sugary, salty and fatty. 70 to 75% of food eaten should be vegetables and fruit. Three meals at set times with snacks allowed between meals, but they must be fruit or vegetables. Aim to eat over a 12 hour period , example 7 am to 7 pm, and STOP after  your last meal of the day. Drink water,generally about 64 ounces per day, no other drink is as healthy. Fruit juice is best enjoyed in a healthy way, by EATING the fruit. Thanks for choosing [facility], we consider it a privelige to serve you.

## 2021-05-22 NOTE — Assessment & Plan Note (Signed)
6 day history, [place] to eval for DVT

## 2021-05-22 NOTE — Assessment & Plan Note (Signed)
Uncontrolled.Toradol and depo medrol administered IM in the office , to be followed by a short course of oral prednisone and NSAIDS.  

## 2021-05-22 NOTE — Assessment & Plan Note (Signed)
  Patient re-educated about  the importance of commitment to a  minimum of 150 minutes of exercise per week as able.  The importance of healthy food choices with portion control discussed, as well as eating regularly and within a 12 hour window most days. The need to choose "clean , green" food 50 to 75% of the time is discussed, as well as to make water the primary drink and set a goal of 64 ounces water daily.    Weight /BMI 05/22/2021 04/11/2021 04/06/2021  WEIGHT 235 lb 236 lb 265 lb  HEIGHT '5\' 3"'$  '5\' 3"'$  '5\' 3"'$   BMI 41.63 kg/m2 41.81 kg/m2 46.94 kg/m2

## 2021-05-23 LAB — CMP14+EGFR
ALT: 21 IU/L (ref 0–32)
AST: 18 IU/L (ref 0–40)
Albumin/Globulin Ratio: 1.2 (ref 1.2–2.2)
Albumin: 4.2 g/dL (ref 3.8–4.8)
Alkaline Phosphatase: 118 IU/L (ref 44–121)
BUN/Creatinine Ratio: 12 (ref 9–23)
BUN: 16 mg/dL (ref 6–24)
Bilirubin Total: 0.6 mg/dL (ref 0.0–1.2)
CO2: 23 mmol/L (ref 20–29)
Calcium: 10.3 mg/dL — ABNORMAL HIGH (ref 8.7–10.2)
Chloride: 98 mmol/L (ref 96–106)
Creatinine, Ser: 1.29 mg/dL — ABNORMAL HIGH (ref 0.57–1.00)
Globulin, Total: 3.4 g/dL (ref 1.5–4.5)
Glucose: 93 mg/dL (ref 65–99)
Potassium: 3.9 mmol/L (ref 3.5–5.2)
Sodium: 139 mmol/L (ref 134–144)
Total Protein: 7.6 g/dL (ref 6.0–8.5)
eGFR: 52 mL/min/{1.73_m2} — ABNORMAL LOW (ref >59)

## 2021-05-23 LAB — CBC
Hematocrit: 43 % (ref 34.0–46.6)
Hemoglobin: 14.8 g/dL (ref 11.1–15.9)
MCH: 29.2 pg (ref 26.6–33.0)
MCHC: 34.4 g/dL (ref 31.5–35.7)
MCV: 85 fL (ref 79–97)
Platelets: 214 10*3/uL (ref 150–450)
RBC: 5.06 x10E6/uL (ref 3.77–5.28)
RDW: 12.7 % (ref 11.7–15.4)
WBC: 8.4 10*3/uL (ref 3.4–10.8)

## 2021-05-23 LAB — HEMOGLOBIN A1C
Est. average glucose Bld gHb Est-mCnc: 128 mg/dL
Hgb A1c MFr Bld: 6.1 % — ABNORMAL HIGH (ref 4.8–5.6)

## 2021-05-23 LAB — LIPID PANEL
Chol/HDL Ratio: 4.4 ratio (ref 0.0–4.4)
Cholesterol, Total: 190 mg/dL (ref 100–199)
HDL: 43 mg/dL (ref >39)
LDL Chol Calc (NIH): 129 mg/dL — ABNORMAL HIGH (ref 0–99)
Triglycerides: 96 mg/dL (ref 0–149)
VLDL Cholesterol Cal: 18 mg/dL (ref 5–40)

## 2021-05-23 LAB — URIC ACID: Uric Acid: 4 mg/dL (ref 2.6–6.2)

## 2021-05-23 LAB — TSH: TSH: 2.85 u[IU]/mL (ref 0.450–4.500)

## 2021-05-23 LAB — VITAMIN D 25 HYDROXY (VIT D DEFICIENCY, FRACTURES): Vit D, 25-Hydroxy: 39.5 ng/mL (ref 30.0–100.0)

## 2021-07-03 NOTE — Progress Notes (Signed)
CARDIOLOGY CONSULT NOTE       Patient ID: [name] MRN: [id number] DOB/AGE: 1976-02-18 45 y.o.  Admit date: (Not on file) Referring Physician: Moshe Cipro Primary Physician: Fayrene Helper, MD Primary Cardiologist: Johnsie Cancel Reason for Consultation: Chest Pain/Palpitations  Active Problems:   * No active hospital problems. *   HPI:  45 y.o. referred by Dr Moshe Cipro 04/07/20  for chest pain and palpitations. She  lost her mother at that time and During mom's illness / hospitalization felt tightness in her chest through out the day and palpitations. Skips and rapid beats. Symptoms lasted 10 days No associated dyspnea , diaphoresis , or pre syncope She has HLD, HTN.  And premature family history of CAD  In 2015 she had "syncope" when placed on too much medication for HTN. She had negative neuro / cardiac w/u which included normal echo 10/14/14 save mild MR  and carotids 08/28/14 She also had normal monitor with no arrhythmias 10/14/14   Her brother is 54 and has some developmental challenges and previous stroke He has not lived independently He passed away recently as well 2021-05-25   Patient is married with two teenage kids    Activity limited by left knee arthritis Duplex negative for DVT 05/22/21   Seen in ED 04/06/21 with chest pain R/O no acute ECG changes Family had COVID at time and she had some dyspnea and relief of tightness with inhaler   She is grieving and doing much better. 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 [employer] Early college One wants to go into behavioral health and on education Older son driving    ROS All other systems reviewed and negative except as noted above  Past Medical History:  Diagnosis Date   Allergic rhinitis    Gout    Hyperlipidemia    Hypertension    Lumbar disc disease    Obesity    Syncope and collapse 09/27/2014   associated with over corrected htn, had cardiac and neurologic eval in 2015    Family History  Problem  Relation Age of Onset   Hypertension Mother    Hyperlipidemia Mother    Diabetes Mother    Cancer Mother        breast    Obesity Mother    Heart disease Father    Heart attack Father    Heart attack Brother    Hypertension Brother    Stroke Brother    Alcohol abuse Maternal Aunt    Drug abuse Maternal Uncle     Social History   Socioeconomic History   Marital status: Married    Spouse name: Not on file   Number of children: 2   Years of education: college   Highest education level: Not on file  Occupational History   Not on file  Tobacco Use   Smoking status: Never   Smokeless tobacco: Never  Vaping Use   Vaping Use: Never used  Substance and Sexual Activity   Alcohol use: No    Alcohol/week: 0.0 standard drinks   Drug use: Never   Sexual activity: Yes    Birth control/protection: None  Other Topics Concern   Not on file  Social History Narrative   Patient is married and lives at home with her husband.    Social Determinants of Health   Financial Resource Strain: Not on file  Food Insecurity: Not on file  Transportation Needs: Not on file  Physical Activity: Not on file  Stress: Not on  file  Social Connections: Not on file  Intimate Partner Violence: Not on file    Past Surgical History:  Procedure Laterality Date   CESAREAN SECTION     CHOLECYSTECTOMY     COLONOSCOPY WITH PROPOFOL N/A 04/10/2021   Procedure: COLONOSCOPY WITH PROPOFOL;  Surgeon: Eloise Harman, DO;  Location: [facility];  Service: Endoscopy;  Laterality: N/A;  ASA III / 9:30   LAPAROSCOPIC GASTRIC BANDING  08/18/2010      Current Outpatient Medications:    albuterol (VENTOLIN HFA) 108 (90 Base) MCG/ACT inhaler, Inhale 1-2 puffs into the lungs every 4 (four) hours as needed for wheezing or shortness of breath., Disp: 18 g, Rfl: 2   allopurinol (ZYLOPRIM) 300 MG tablet, Take 1 tablet (300 mg total) by mouth daily. (Patient taking differently: Take 300 mg by mouth in the morning.),  Disp: 30 tablet, Rfl: 5   amLODipine (NORVASC) 5 MG tablet, TAKE 1 TABLET(5 MG) BY MOUTH DAILY (Patient taking differently: Take 5 mg by mouth in the morning.), Disp: 90 tablet, Rfl: 1   atorvastatin (LIPITOR) 40 MG tablet, Take 1 tablet (40 mg total) by mouth daily. (Patient taking differently: Take 40 mg by mouth in the morning.), Disp: 90 tablet, Rfl: 0   famotidine (PEPCID) 20 MG tablet, Take 1 tablet (20 mg total) by mouth 2 (two) times daily., Disp: 60 tablet, Rfl: 0   fluticasone (FLONASE) 50 MCG/ACT nasal spray, Place 1 spray into both nostrils 2 (two) times daily. (Patient taking differently: Place 1 spray into both nostrils 2 (two) times daily as needed for allergies.), Disp: 48 g, Rfl: 0   Multiple Vitamin (MULTIVITAMIN WITH MINERALS) TABS tablet, Take 1 tablet by mouth every evening., Disp: , Rfl:    triamterene-hydrochlorothiazide (MAXZIDE) 75-50 MG tablet, Take 1 tablet by mouth daily., Disp: 30 tablet, Rfl: 3   predniSONE (STERAPRED UNI-PAK 21 TAB) 5 MG (21) TBPK tablet, Take 1 tablet (5 mg total) by mouth as directed. Use as directed (Patient not taking: Reported on 07/10/2021), Disp: 21 tablet, Rfl: 0  Current Facility-Administered Medications:    methylPREDNISolone acetate (DEPO-MEDROL) injection 80 mg, 80 mg, Intramuscular, Once, Fayrene Helper, MD    Physical Exam: Blood pressure 130/82, pulse 72, height '5\' 2"'$  (1.575 m), weight 105.5 kg, SpO2 99 %.   Affect appropriate Healthy:  appears stated age 45: normal Neck supple with no adenopathy JVP normal no bruits no thyromegaly Lungs clear with no wheezing and good diaphragmatic motion Heart:  S1/S2 no murmur, no rub, gallop or click PMI normal Abdomen: benighn, BS positve, no tenderness, no AAA no bruit.  No HSM or HJR Distal pulses intact with no bruits No edema Neuro non-focal Skin warm and dry No muscular weakness   Labs:   Lab Results  Component Value Date   WBC 8.4 05/22/2021   HGB 14.8 05/22/2021    HCT 43.0 05/22/2021   MCV 85 05/22/2021   PLT 214 05/22/2021   No results for input(s): NA, K, CL, CO2, BUN, CREATININE, CALCIUM, PROT, BILITOT, ALKPHOS, ALT, AST, GLUCOSE in the last 168 hours.  Invalid input(s): LABALBU Lab Results  Component Value Date   CKTOTAL 113 05/08/2007   CKMB 1.1 05/08/2007   TROPONINI 0.04        NO INDICATION OF MYOCARDIAL INJURY. 05/08/2007    Lab Results  Component Value Date   CHOL 190 05/22/2021   CHOL 164 05/18/2020   CHOL 153 11/26/2019   Lab Results  Component Value Date  HDL 43 05/22/2021   HDL 38 (L) 05/18/2020   HDL 39 (L) 11/26/2019   Lab Results  Component Value Date   LDLCALC 129 (H) 05/22/2021   LDLCALC 109 (H) 05/18/2020   LDLCALC 99 11/26/2019   Lab Results  Component Value Date   TRIG 96 05/22/2021   TRIG 92 05/18/2020   TRIG 63 11/26/2019   Lab Results  Component Value Date   CHOLHDL 4.4 05/22/2021   CHOLHDL 4.3 05/18/2020   CHOLHDL 3.9 11/26/2019   No results found for: LDLDIRECT    Radiology: No results found.  EKG:  04/09/21 SR rate 70 normal    ASSESSMENT AND PLAN:   1. Chest Pain: Atypical with CRFls premature family history , HTN , and HLD with normal ECG I think her issues revolve around the death of her mom and now brother Shared decision agrees no testing now and cardiac CTA if Symptoms return   2. Palpitations:  Benign likely related to stress with normal exam and ECG don't feel that monitor needed   3. HTN:  On norvasc 5 mg and Maxzide per Dr Moshe Cipro discussed low sodium diet   4. HLD:  Continue statin labs with primary LDL 129 05/22/21   5. DM:  A1c 6.1 05/22/21 discussed diet and weight loss f/u primary consider glucophage   F/U in a year   Signed: Jenkins Rouge 07/10/2021, 9:03 AM

## 2021-07-06 ENCOUNTER — Encounter: Payer: Self-pay | Admitting: Family Medicine

## 2021-07-06 ENCOUNTER — Other Ambulatory Visit: Payer: Self-pay | Admitting: Family Medicine

## 2021-07-06 MED ORDER — ATORVASTATIN CALCIUM 40 MG PO TABS: 40.0000 mg | ORAL_TABLET | Freq: Every day | ORAL | 3 refills | Status: DC

## 2021-07-06 NOTE — Progress Notes (Signed)
atorvastatin

## 2021-07-10 ENCOUNTER — Encounter: Payer: Self-pay | Admitting: Cardiovascular Disease

## 2021-07-10 ENCOUNTER — Ambulatory Visit (INDEPENDENT_AMBULATORY_CARE_PROVIDER_SITE_OTHER): Payer: BC Managed Care – PPO | Admitting: Cardiovascular Disease

## 2021-07-10 ENCOUNTER — Other Ambulatory Visit: Payer: Self-pay

## 2021-07-10 VITALS — BP 130/82 | HR 72 | Ht 62.0 in | Wt 232.6 lb

## 2021-07-10 DIAGNOSIS — R002 Palpitations: Secondary | ICD-10-CM | POA: Diagnosis not present

## 2021-07-10 DIAGNOSIS — I10 Essential (primary) hypertension: Secondary | ICD-10-CM | POA: Diagnosis not present

## 2021-07-10 DIAGNOSIS — E782 Mixed hyperlipidemia: Secondary | ICD-10-CM | POA: Diagnosis not present

## 2021-07-10 DIAGNOSIS — R0789 Other chest pain: Secondary | ICD-10-CM

## 2021-07-10 NOTE — Patient Instructions (Signed)
Medication Instructions:  Your physician recommends that you continue on your current medications as directed. Please refer to the Current Medication list given to you today.  *If you need a refill on your cardiac medications before your next appointment, please call your pharmacy*   Lab Work: NONE   If you have labs (blood work) drawn today and your tests are completely normal, you will receive your results only by: . MyChart Message (if you have MyChart) OR . A paper copy in the mail If you have any lab test that is abnormal or we need to change your treatment, we will call you to review the results.   Testing/Procedures: NONE    Follow-Up: At [facility], you and your health needs are our priority.  As part of our continuing mission to provide you with exceptional heart care, we have created designated Provider Care Teams.  These Care Teams include your primary Cardiologist (physician) and Advanced Practice Providers (APPs -  Physician Assistants and Nurse Practitioners) who all work together to provide you with the care you need, when you need it.  We recommend signing up for the patient portal called "MyChart".  Sign up information is provided on this After Visit Summary.  MyChart is used to connect with patients for Virtual Visits (Telemedicine).  Patients are able to view lab/test results, encounter notes, upcoming appointments, etc.  Non-urgent messages can be sent to your provider as well.   To learn more about what you can do with MyChart, go to [email or web address].    Your next appointment:   1 year(s)  The format for your next appointment:   In Person  Provider:   Peter Nishan, MD   Other Instructions Thank you for choosing [facility]!    

## 2021-07-13 ENCOUNTER — Ambulatory Visit (INDEPENDENT_AMBULATORY_CARE_PROVIDER_SITE_OTHER): Payer: BC Managed Care – PPO | Admitting: Allergy & Immunology

## 2021-07-13 ENCOUNTER — Other Ambulatory Visit: Payer: Self-pay

## 2021-07-13 ENCOUNTER — Encounter: Payer: Self-pay | Admitting: Allergy & Immunology

## 2021-07-13 VITALS — BP 118/76 | HR 72 | Temp 97.9°F | Resp 18 | Ht 62.0 in | Wt 234.0 lb

## 2021-07-13 DIAGNOSIS — T7800XD Anaphylactic reaction due to unspecified food, subsequent encounter: Secondary | ICD-10-CM

## 2021-07-13 DIAGNOSIS — J310 Chronic rhinitis: Secondary | ICD-10-CM

## 2021-07-13 MED ORDER — TRIAMCINOLONE ACETONIDE 55 MCG/ACT NA AERO: 2.0000 | INHALATION_SPRAY | Freq: Every day | NASAL | 5 refills | Status: DC

## 2021-07-13 MED ORDER — EPINEPHRINE 0.3 MG/0.3ML IJ SOAJ: 0.3000 mg | Freq: Once | INTRAMUSCULAR | 1 refills | Status: AC
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
## 2021-07-13 NOTE — Patient Instructions (Addendum)
1. Chronic rhinitis - Testing was unfortunately negative to the entire panel. - Since we are drawing blood for your possible food allergy, we will get an environmental allergy panel as well.  - Continue with over the counter antihistamines as needed. - Consider using a nasal steroid like Nasacort during the worst times of the year (sample provided).  - We will call you in 1-2 weeks with the results of the testing.   2. Anaphylactic shock due to food - Testing was negative to peanuts, tree nuts, and shellfish. - We are going to get some blood work to confirm.  - We will call you in 1-2 weeks with the results of the testing.  - EpiPen training reviewed.  3. Follow up to be determined based on labs.       Please inform us of any Emergency Department visits, hospitalizations, or changes in symptoms. Call us before going to the ED for breathing or allergy symptoms since we might be able to fit you in for a sick visit. Feel free to contact us anytime with any questions, problems, or concerns.  It was a pleasure to meet you today!  Websites that have reliable patient information: 1. American Academy of Asthma, Allergy, and Immunology: www.aaaai.org 2. Food Allergy Research and Education (FARE): foodallergy.org 3. Mothers of Asthmatics: http://www.asthmacommunitynetwork.org 4. American College of Allergy, Asthma, and Immunology: www.acaai.org   COVID-19 Vaccine Information can be found at: ShippingScam.co.uk For questions related to vaccine distribution or appointments, please email [email or web address] or call [phone].   We realize that you might be concerned about having an allergic reaction to the COVID19 vaccines. To help with that concern, WE ARE OFFERING THE COVID19 VACCINES IN OUR OFFICE! Ask the front desk for dates!     "Like" [place] on Facebook and Instagram for our latest updates!      A healthy democracy works  best when [employer] participate! Make sure you are registered to vote! If you have moved or changed any of your contact information, you will need to get this updated before voting!  In some cases, you MAY be able to register to vote online: [email or web address]

## 2021-07-15 ENCOUNTER — Encounter: Payer: Self-pay | Admitting: Allergy & Immunology

## 2021-07-17 ENCOUNTER — Ambulatory Visit: Payer: BC Managed Care – PPO | Admitting: Family Medicine

## 2021-07-19 LAB — ALLERGENS W/COMP RFLX AREA 2
Alternaria Alternata IgE: <0.1 kU/L
Aspergillus Fumigatus IgE: <0.1 kU/L
Bermuda Grass IgE: <0.1 kU/L
Cedar, Mountain IgE: <0.1 kU/L
Cladosporium Herbarum IgE: <0.1 kU/L
Cockroach, German IgE: <0.1 kU/L
Common Silver Birch IgE: <0.1 kU/L
Cottonwood IgE: <0.1 kU/L
D Farinae IgE: <0.1 kU/L
D Pteronyssinus IgE: <0.1 kU/L
E001-IgE Cat Dander: <0.1 kU/L
E005-IgE Dog Dander: <0.1 kU/L
Elm, American IgE: <0.1 kU/L
IgE (Immunoglobulin E), Serum: 2 IU/mL — ABNORMAL LOW (ref 6–495)
Johnson Grass IgE: <0.1 kU/L
Maple/Box Elder IgE: <0.1 kU/L
Mouse Urine IgE: <0.1 kU/L
Oak, White IgE: <0.1 kU/L
Pecan, Hickory IgE: <0.1 kU/L
Penicillium Chrysogen IgE: <0.1 kU/L
Pigweed, Rough IgE: <0.1 kU/L
Ragweed, Short IgE: <0.1 kU/L
Sheep Sorrel IgE Qn: <0.1 kU/L
Timothy Grass IgE: <0.1 kU/L
White Mulberry IgE: <0.1 kU/L

## 2021-07-19 LAB — ALLERGY PANEL 18, NUT MIX GROUP
Allergen Coconut IgE: <0.1 kU/L
F020-IgE Almond: <0.1 kU/L
F202-IgE Cashew Nut: <0.1 kU/L
Hazelnut (Filbert) IgE: <0.1 kU/L
Peanut IgE: <0.1 kU/L
Pecan Nut IgE: <0.1 kU/L
Sesame Seed IgE: <0.1 kU/L

## 2021-07-19 LAB — ALLERGEN PROFILE, SHELLFISH
Clam IgE: <0.1 kU/L
F023-IgE Crab: <0.1 kU/L
F080-IgE Lobster: <0.1 kU/L
F290-IgE Oyster: <0.1 kU/L
Scallop IgE: <0.1 kU/L
Shrimp IgE: <0.1 kU/L

## 2021-07-26 ENCOUNTER — Encounter: Payer: Self-pay | Admitting: Allergy & Immunology

## 2021-08-08 ENCOUNTER — Other Ambulatory Visit: Payer: Self-pay | Admitting: *Deleted

## 2021-08-08 ENCOUNTER — Encounter: Payer: Self-pay | Admitting: Family Medicine

## 2021-08-08 MED ORDER — AMLODIPINE BESYLATE 5 MG PO TABS: ORAL_TABLET | ORAL | 1 refills | Status: DC

## 2021-08-14 ENCOUNTER — Ambulatory Visit: Payer: BC Managed Care – PPO | Admitting: Family Medicine

## 2021-08-21 ENCOUNTER — Other Ambulatory Visit: Payer: Self-pay

## 2021-08-21 ENCOUNTER — Encounter: Payer: Self-pay | Admitting: Family Medicine

## 2021-08-21 ENCOUNTER — Ambulatory Visit (INDEPENDENT_AMBULATORY_CARE_PROVIDER_SITE_OTHER): Payer: BC Managed Care – PPO | Admitting: Internal Medicine

## 2021-08-21 ENCOUNTER — Encounter: Payer: Self-pay | Admitting: Internal Medicine

## 2021-08-21 DIAGNOSIS — J0110 Acute frontal sinusitis, unspecified: Secondary | ICD-10-CM

## 2021-08-21 MED ORDER — AMOXICILLIN-POT CLAVULANATE 875-125 MG PO TABS: 1.0000 | ORAL_TABLET | Freq: Two times a day (BID) | ORAL | 0 refills | Status: DC

## 2021-08-21 NOTE — Progress Notes (Signed)
Virtual Visit via Telephone Note   This visit type was conducted due to national recommendations for restrictions regarding the COVID-19 Pandemic (e.g. social distancing) in an effort to limit this patient's exposure and mitigate transmission in our community.  Due to her co-morbid illnesses, this patient is at least at moderate risk for complications without adequate follow up.  This format is felt to be most appropriate for this patient at this time.  The patient did not have access to video technology/had technical difficulties with video requiring transitioning to audio format only (telephone).  All issues noted in this document were discussed and addressed.  No physical exam could be performed with this format.  Evaluation Performed:  Follow-up visit  Date:  08/21/2021   ID:  [name], DOB 04/25/1976, MRN [id number]  Patient Location: Home Provider Location: Office/Clinic  Participants: Patient Location of Patient: Home Location of Provider: Telehealth Consent was obtain for visit to be over via telehealth. I verified that I am speaking with the correct person using two identifiers.  PCP:  Fayrene Helper, MD   Chief Complaint: Sinus pressure and headache  History of Present Illness:    [name] is a 45 y.o. female who has a televisit for complaint of sinus pressure related headache, sore throat, cough and fatigue for last 3 days.  She denies any fever, but does report chills yesterday.  Denies any dyspnea or wheezing currently.  She had negative home COVID test.  She has tried Tylenol Cold and flu and Sudafed for nasal congestion. She had to leave early from work today due to her symptoms.  The patient does have symptoms concerning for COVID-19 infection (fever, chills, cough, or new shortness of breath).   Past Medical, Surgical, Social History, Allergies, and Medications have been Reviewed.  Past Medical History:  Diagnosis Date   Allergic  rhinitis    Gout    Hyperlipidemia    Hypertension    Lumbar disc disease    Obesity    Syncope and collapse 09/27/2014   associated with over corrected htn, had cardiac and neurologic eval in 2015   Past Surgical History:  Procedure Laterality Date   CESAREAN SECTION     CHOLECYSTECTOMY     COLONOSCOPY WITH PROPOFOL N/A 04/10/2021   Procedure: COLONOSCOPY WITH PROPOFOL;  Surgeon: Eloise Harman, DO;  [facility];  Service: Endoscopy;  Laterality: N/A;  ASA III / 9:30   LAPAROSCOPIC GASTRIC BANDING  08/18/2010     Current Meds  Medication Sig   albuterol (VENTOLIN HFA) 108 (90 Base) MCG/ACT inhaler Inhale 1-2 puffs into the lungs every 4 (four) hours as needed for wheezing or shortness of breath.   allopurinol (ZYLOPRIM) 300 MG tablet Take 1 tablet (300 mg total) by mouth daily. (Patient taking differently: Take 300 mg by mouth in the morning.)   amLODipine (NORVASC) 5 MG tablet TAKE 1 TABLET(5 MG) BY MOUTH DAILY   atorvastatin (LIPITOR) 40 MG tablet Take 1 tablet (40 mg total) by mouth daily. (Patient taking differently: Take 40 mg by mouth in the morning.)   famotidine (PEPCID) 20 MG tablet Take 1 tablet (20 mg total) by mouth 2 (two) times daily.   fluticasone (FLONASE) 50 MCG/ACT nasal spray Place 1 spray into both nostrils 2 (two) times daily. (Patient taking differently: Place 1 spray into both nostrils 2 (two) times daily as needed for allergies.)   Multiple Vitamin (MULTIVITAMIN WITH MINERALS) TABS tablet Take 1 tablet  by mouth every evening.   predniSONE (STERAPRED UNI-PAK 21 TAB) 5 MG (21) TBPK tablet Take 1 tablet (5 mg total) by mouth as directed. Use as directed   triamcinolone (NASACORT) 55 MCG/ACT AERO nasal inhaler Place 2 sprays into the nose daily.   triamterene-hydrochlorothiazide (MAXZIDE) 75-50 MG tablet Take 1 tablet by mouth daily.   Current Facility-Administered Medications for the 08/21/21 encounter (Office Visit) with Lindell Spar, MD   Medication   methylPREDNISolone acetate (DEPO-MEDROL) injection 80 mg     Allergies:   Hydrocodone-acetaminophen and Hydrocodone-acetaminophen   ROS:   Please see the history of present illness.     All other systems reviewed and are negative.   Labs/Other Tests and Data Reviewed:    Recent Labs: 05/22/2021: ALT 21; BUN 16; Creatinine, Ser 1.29; Hemoglobin 14.8; Platelets 214; Potassium 3.9; Sodium 139; TSH 2.850   Recent Lipid Panel Lab Results  Component Value Date/Time   CHOL 190 05/22/2021 09:35 AM   TRIG 96 05/22/2021 09:35 AM   HDL 43 05/22/2021 09:35 AM   CHOLHDL 4.4 05/22/2021 09:35 AM   CHOLHDL 3.9 11/26/2019 08:33 AM   LDLCALC 129 (H) 05/22/2021 09:35 AM   LDLCALC 99 11/26/2019 08:33 AM    Wt Readings from Last 3 Encounters:  07/13/21 234 lb (106.1 kg)  07/10/21 232 lb 9.6 oz (105.5 kg)  05/22/21 235 lb (106.6 kg)      ASSESSMENT & PLAN:    Acute sinusitis Started Augmentin has persistent symptoms despite symptomatic treatment Home COVID test negative Nasal saline spray as needed Continue Flonase for allergies Work note provided  Time:   Today, I have spent 9 minutes reviewing the chart, including problem list, medications, and with the patient with telehealth technology discussing the above problems.   Medication Adjustments/Labs and Tests Ordered: Current medicines are reviewed at length with the patient today.  Concerns regarding medicines are outlined above.   Tests Ordered: No orders of the defined types were placed in this encounter.   Medication Changes: No orders of the defined types were placed in this encounter.    Note: This dictation was prepared with Dragon dictation along with smaller phrase technology. Similar sounding words can be transcribed inadequately or may not be corrected upon review. Any transcriptional errors that result from this process are unintentional.      Disposition:  Follow up  Signed, Lindell Spar, MD   08/21/2021 3:59 PM     [place]

## 2021-08-31 ENCOUNTER — Ambulatory Visit (INDEPENDENT_AMBULATORY_CARE_PROVIDER_SITE_OTHER): Payer: BC Managed Care – PPO | Admitting: Family Medicine

## 2021-08-31 ENCOUNTER — Encounter: Payer: Self-pay | Admitting: Family Medicine

## 2021-08-31 ENCOUNTER — Other Ambulatory Visit: Payer: Self-pay

## 2021-08-31 VITALS — BP 133/81 | HR 89 | Resp 16 | Ht 62.0 in | Wt 236.0 lb

## 2021-08-31 DIAGNOSIS — Z23 Encounter for immunization: Secondary | ICD-10-CM

## 2021-08-31 DIAGNOSIS — E7800 Pure hypercholesterolemia, unspecified: Secondary | ICD-10-CM

## 2021-08-31 DIAGNOSIS — R7303 Prediabetes: Secondary | ICD-10-CM

## 2021-08-31 DIAGNOSIS — I10 Essential (primary) hypertension: Secondary | ICD-10-CM | POA: Diagnosis not present

## 2021-08-31 DIAGNOSIS — F4321 Adjustment disorder with depressed mood: Secondary | ICD-10-CM

## 2021-08-31 NOTE — Patient Instructions (Signed)
F/u mid February, call if you need me sooner  Thankful you are doing better  No med change  Flu vaccine today  CHALLENGE is ON!!  ASim for 10 pound weight loss and improved labs in next 4 months  Fasting lipid, cmp and eGFr and hBA1C 5 days before next visit  It is important that you exercise regularly at least 30 minutes 5 times a week. If you develop chest pain, have severe difficulty breathing, or feel very tired, stop exercising immediately and seek medical attention   Think about what you will eat, plan ahead. Choose " clean, green, fresh or frozen" over canned, processed or packaged foods which are more sugary, salty and fatty. 70 to 75% of food eaten should be vegetables and fruit. Three meals at set times with snacks allowed between meals, but they must be fruit or vegetables. Aim to eat over a 12 hour period , example 7 am to 7 pm, and STOP after  your last meal of the day. Drink water,generally about 64 ounces per day, no other drink is as healthy. Fruit juice is best enjoyed in a healthy way, by EATING the fruit. Limit calorie to 1200 to 1500 cal/ day

## 2021-09-02 ENCOUNTER — Encounter: Payer: Self-pay | Admitting: Family Medicine

## 2021-09-02 NOTE — Progress Notes (Signed)
[name]     MRN: [id number]      DOB: 1976/07/08   HPI [name] is here for follow up and re-evaluation of chronic medical conditions, medication management and review of any available recent lab and radiology data.  Preventive health is updated, specifically  Cancer screening and Immunization.   Questions or concerns regarding consultations or procedures which the PT has had in the interim are  addressed. The PT denies any adverse reactions to current medications since the last visit.  There are no new concerns.  There are no specific complaints   ROS Denies recent fever or chills. Denies sinus pressure, nasal congestion, ear pain or sore throat. Denies chest congestion, productive cough or wheezing. Denies chest pains, palpitations and leg swelling Denies abdominal pain, nausea, vomiting,diarrhea or constipation.   Denies dysuria, frequency, hesitancy or incontinence. Denies joint pain, swelling and limitation in mobility. Denies headaches, seizures, numbness, or tingling. Denies depression, anxiety or insomnia. Denies skin break down or rash.   PE  BP 133/81   Pulse 89   Resp 16   Ht 5\' 2"  (1.575 m)   Wt 236 lb (107 kg)   SpO2 98%   BMI 43.16 kg/m   Patient alert and oriented and in no cardiopulmonary distress.  HEENT: No facial asymmetry, EOMI,     Neck supple .  Chest: Clear to auscultation bilaterally.  CVS: S1, S2 no murmurs, no S3.Regular rate.  ABD: Soft non tender.   Ext: No edema  MS: Adequate ROM spine, shoulders, hips and knees.  Skin: Intact, no ulcerations or rash noted.  Psych: Good eye contact, normal affect. Memory intact not anxious or depressed appearing.  CNS: CN 2-12 intact, power,  normal throughout.no focal deficits noted.   Assessment & Plan Essential hypertension Controlled, no change in medication DASH diet and commitment to daily physical activity for a minimum of 30 minutes discussed and encouraged, as a  part of hypertension management. The importance of attaining a healthy weight is also discussed.  BP/Weight 08/31/2021 07/13/2021 07/10/2021 05/22/2021 04/11/2021 04/10/2021 7/62/8315  Systolic BP 176 160 737 106 269 485 462  Diastolic BP 81 76 82 87 77 68 71  Wt. (Lbs) 236 234 232.6 235 236 - 265  BMI 43.16 42.8 42.54 41.63 41.81 - 46.94       MORBID OBESITY  Patient re-educated about  the importance of commitment to a  minimum of 150 minutes of exercise per week as able.  The importance of healthy food choices with portion control discussed, as well as eating regularly and within a 12 hour window most days. The need to choose "clean , green" food 50 to 75% of the time is discussed, as well as to make water the primary drink and set a goal of 64 ounces water daily.    Weight /BMI 08/31/2021 07/13/2021 07/10/2021  WEIGHT 236 lb 234 lb 232 lb 9.6 oz  HEIGHT 5\' 2"  5\' 2"  5\' 2"   BMI 43.16 kg/m2 42.8 kg/m2 42.54 kg/m2      Hyperlipemia Hyperlipidemia:Low fat diet discussed and encouraged.   Lipid Panel  Lab Results  Component Value Date   CHOL 190 05/22/2021   HDL 43 05/22/2021   LDLCALC 129 (H) 05/22/2021   TRIG 96 05/22/2021   CHOLHDL 4.4 05/22/2021     Needs to lower fat intake, no med change  Grief improved since last visit  Prediabetes Patient educated about the importance of limiting  Carbohydrate intake , the need  to commit to daily physical activity for a minimum of 30 minutes , and to commit weight loss. The fact that changes in all these areas will reduce or eliminate all together the development of diabetes is stressed.  Worsened  Diabetic Labs Latest Ref Rng & Units 05/22/2021 04/06/2021 04/06/2021 07/27/2020 05/18/2020  HbA1c 4.8 - 5.6 % 6.1(H) - - 5.8(A) -  Chol 100 - 199 mg/dL 190 - - - 164  HDL >39 mg/dL 43 - - - 38(L)  Calc LDL 0 - 99 mg/dL 129(H) - - - 109(H)  Triglycerides 0 - 149 mg/dL 96 - - - 92  Creatinine 0.57 - 1.00 mg/dL 1.29(H) 1.10(H) 1.10(H) - -    BP/Weight 08/31/2021 07/13/2021 07/10/2021 05/22/2021 04/11/2021 04/10/2021 8/47/2072  Systolic BP 182 883 374 451 460 479 987  Diastolic BP 81 76 82 87 77 68 71  Wt. (Lbs) 236 234 232.6 235 236 - 265  BMI 43.16 42.8 42.54 41.63 41.81 - 46.94   No flowsheet data found.

## 2021-09-02 NOTE — Assessment & Plan Note (Signed)
Hyperlipidemia:Low fat diet discussed and encouraged.   Lipid Panel  Lab Results  Component Value Date   CHOL 190 05/22/2021   HDL 43 05/22/2021   LDLCALC 129 (H) 05/22/2021   TRIG 96 05/22/2021   CHOLHDL 4.4 05/22/2021     Needs to lower fat intake, no med change

## 2021-09-02 NOTE — Assessment & Plan Note (Signed)
  Patient re-educated about  the importance of commitment to a  minimum of 150 minutes of exercise per week as able.  The importance of healthy food choices with portion control discussed, as well as eating regularly and within a 12 hour window most days. The need to choose "clean , green" food 50 to 75% of the time is discussed, as well as to make water the primary drink and set a goal of 64 ounces water daily.    Weight /BMI 08/31/2021 07/13/2021 07/10/2021  WEIGHT 236 lb 234 lb 232 lb 9.6 oz  HEIGHT 5\' 2"  5\' 2"  5\' 2"   BMI 43.16 kg/m2 42.8 kg/m2 42.54 kg/m2

## 2021-09-02 NOTE — Assessment & Plan Note (Signed)
Controlled, no change in medication DASH diet and commitment to daily physical activity for a minimum of 30 minutes discussed and encouraged, as a part of hypertension management. The importance of attaining a healthy weight is also discussed.  BP/Weight 08/31/2021 07/13/2021 07/10/2021 05/22/2021 04/11/2021 04/10/2021 11/10/6429  Systolic BP 427 670 110 034 961 164 353  Diastolic BP 81 76 82 87 77 68 71  Wt. (Lbs) 236 234 232.6 235 236 - 265  BMI 43.16 42.8 42.54 41.63 41.81 - 46.94

## 2021-09-02 NOTE — Assessment & Plan Note (Signed)
Patient educated about the importance of limiting  Carbohydrate intake , the need to commit to daily physical activity for a minimum of 30 minutes , and to commit weight loss. The fact that changes in all these areas will reduce or eliminate all together the development of diabetes is stressed.  Worsened  Diabetic Labs Latest Ref Rng & Units 05/22/2021 04/06/2021 04/06/2021 07/27/2020 05/18/2020  HbA1c 4.8 - 5.6 % 6.1(H) - - 5.8(A) -  Chol 100 - 199 mg/dL 190 - - - 164  HDL >39 mg/dL 43 - - - 38(L)  Calc LDL 0 - 99 mg/dL 129(H) - - - 109(H)  Triglycerides 0 - 149 mg/dL 96 - - - 92  Creatinine 0.57 - 1.00 mg/dL 1.29(H) 1.10(H) 1.10(H) - -   BP/Weight 08/31/2021 07/13/2021 07/10/2021 05/22/2021 04/11/2021 04/10/2021 06/25/5620  Systolic BP 308 657 846 962 952 841 324  Diastolic BP 81 76 82 87 77 68 71  Wt. (Lbs) 236 234 232.6 235 236 - 265  BMI 43.16 42.8 42.54 41.63 41.81 - 46.94   No flowsheet data found.

## 2021-09-02 NOTE — Assessment & Plan Note (Signed)
improved since last visit

## 2021-09-24 ENCOUNTER — Telehealth: Payer: Self-pay | Admitting: Orthopaedic Surgery

## 2021-10-05 ENCOUNTER — Encounter: Payer: Self-pay | Admitting: Family Medicine

## 2021-10-05 MED ORDER — TRIAMTERENE-HCTZ 75-50 MG PO TABS: 1.0000 | ORAL_TABLET | Freq: Every day | ORAL | 3 refills | Status: DC

## 2021-12-14 ENCOUNTER — Ambulatory Visit: Payer: BC Managed Care – PPO | Admitting: Family Medicine

## 2021-12-19 ENCOUNTER — Other Ambulatory Visit: Payer: Self-pay | Admitting: Family Medicine

## 2021-12-27 ENCOUNTER — Ambulatory Visit: Payer: BC Managed Care – PPO | Admitting: Family Medicine

## 2022-01-01 LAB — LIPID PANEL
Chol/HDL Ratio: 4.4 ratio (ref 0.0–4.4)
Cholesterol, Total: 176 mg/dL (ref 100–199)
HDL: 40 mg/dL (ref >39)
LDL Chol Calc (NIH): 120 mg/dL — ABNORMAL HIGH (ref 0–99)
Triglycerides: 87 mg/dL (ref 0–149)
VLDL Cholesterol Cal: 16 mg/dL (ref 5–40)

## 2022-01-01 LAB — CMP14+EGFR
ALT: 18 IU/L (ref 0–32)
AST: 13 IU/L (ref 0–40)
Albumin/Globulin Ratio: 1.6 (ref 1.2–2.2)
Albumin: 4.2 g/dL (ref 3.8–4.8)
Alkaline Phosphatase: 105 IU/L (ref 44–121)
BUN/Creatinine Ratio: 15 (ref 9–23)
BUN: 17 mg/dL (ref 6–24)
Bilirubin Total: 0.2 mg/dL (ref 0.0–1.2)
CO2: 25 mmol/L (ref 20–29)
Calcium: 10 mg/dL (ref 8.7–10.2)
Chloride: 100 mmol/L (ref 96–106)
Creatinine, Ser: 1.13 mg/dL — ABNORMAL HIGH (ref 0.57–1.00)
Globulin, Total: 2.7 g/dL (ref 1.5–4.5)
Glucose: 105 mg/dL — ABNORMAL HIGH (ref 70–99)
Potassium: 3.6 mmol/L (ref 3.5–5.2)
Sodium: 139 mmol/L (ref 134–144)
Total Protein: 6.9 g/dL (ref 6.0–8.5)
eGFR: 61 mL/min/{1.73_m2} (ref >59)

## 2022-01-01 LAB — HEMOGLOBIN A1C
Est. average glucose Bld gHb Est-mCnc: 128 mg/dL
Hgb A1c MFr Bld: 6.1 % — ABNORMAL HIGH (ref 4.8–5.6)

## 2022-01-04 ENCOUNTER — Other Ambulatory Visit: Payer: Self-pay

## 2022-01-04 ENCOUNTER — Encounter: Payer: Self-pay | Admitting: Family Medicine

## 2022-01-04 ENCOUNTER — Ambulatory Visit (INDEPENDENT_AMBULATORY_CARE_PROVIDER_SITE_OTHER): Payer: BC Managed Care – PPO | Admitting: Family Medicine

## 2022-01-04 VITALS — BP 141/82 | HR 88 | Resp 16 | Ht 62.0 in | Wt 230.1 lb

## 2022-01-04 DIAGNOSIS — R7303 Prediabetes: Secondary | ICD-10-CM | POA: Diagnosis not present

## 2022-01-04 DIAGNOSIS — E7800 Pure hypercholesterolemia, unspecified: Secondary | ICD-10-CM

## 2022-01-04 DIAGNOSIS — I10 Essential (primary) hypertension: Secondary | ICD-10-CM

## 2022-01-04 DIAGNOSIS — J302 Other seasonal allergic rhinitis: Secondary | ICD-10-CM

## 2022-01-04 MED ORDER — SEMAGLUTIDE-WEIGHT MANAGEMENT 0.25 MG/0.5ML ~~LOC~~ SOAJ: 0.2500 mg | SUBCUTANEOUS | 0 refills | Status: AC

## 2022-01-04 MED ORDER — SEMAGLUTIDE-WEIGHT MANAGEMENT 0.5 MG/0.5ML ~~LOC~~ SOAJ: 0.5000 mg | SUBCUTANEOUS | 2 refills | Status: DC

## 2022-01-04 MED ORDER — ROSUVASTATIN CALCIUM 40 MG PO TABS: 40.0000 mg | ORAL_TABLET | Freq: Every day | ORAL | 3 refills | Status: DC

## 2022-01-04 NOTE — Patient Instructions (Signed)
F/u in 7 weeks, call if you need me sooner ? ?New for cholesterol is Crestor 40 mg daily, stop lipitor. ?Reduce fried and fatty foods ? ?New to help with weight loss is once weekly wegovy injections ? ?It is important that you exercise regularly at least 30 minutes 5 times a week. If you develop chest pain, have severe difficulty breathing, or feel very tired, stop exercising immediately and seek medical attention  ? ?Think about what you will eat, plan ahead. ?Choose " clean, green, fresh or frozen" over canned, processed or packaged foods which are more sugary, salty and fatty. ?70 to 75% of food eaten should be vegetables and fruit. ?Three meals at set times with snacks allowed between meals, but they must be fruit or vegetables. ?Aim to eat over a 12 hour period , example 7 am to 7 pm, and STOP after  your last meal of the day. ?Drink water,generally about 64 ounces per day, no other drink is as healthy. Fruit juice is best enjoyed in a healthy way, by EATING the fruit. ?Need to take meds EVERY day , BP is high today ? ? ?Thanks for choosing [facility], we consider it a privelige to serve you. ? ? ?

## 2022-01-06 ENCOUNTER — Encounter: Payer: Self-pay | Admitting: Family Medicine

## 2022-01-06 NOTE — Progress Notes (Signed)
? ?[name]     MRN: [id number]      DOB: 1976-08-20 ? ? ?HPI ?[name] is here for follow up and re-evaluation of chronic medical conditions, medication management and review of any available recent lab and radiology data.  ?Preventive health is updated, specifically  Cancer screening and Immunization.   ?Questions or concerns regarding consultations or procedures which the PT has had in the interim are  addressed. ?The PT denies any adverse reactions to current medications since the last visit.  ?Interested in help with weight loss ?Has not taken BP med today, rushing, child ill...  ? ?ROS ?Denies recent fever or chills. ?Denies sinus pressure, nasal congestion, ear pain or sore throat. ?Denies chest congestion, productive cough or wheezing. ?Denies chest pains, palpitations and leg swelling ?Denies abdominal pain, nausea, vomiting,diarrhea or constipation.   ?Denies dysuria, frequency, hesitancy or incontinence. ?Denies joint pain, swelling and limitation in mobility. ?Denies headaches, seizures, numbness, or tingling. ?Denies depression, anxiety or insomnia. ?Denies skin break down or rash. ? ? ?PE ? ?BP (!) 141/82   Pulse 88   Resp 16   Ht '5\' 2"'$  (1.575 m)   Wt 230 lb 1.3 oz (104.4 kg)   SpO2 97%   BMI 42.08 kg/m?  ? ?Patient alert and oriented and in no cardiopulmonary distress. ? ?HEENT: No facial asymmetry, EOMI,     Neck supple . ? ?Chest: Clear to auscultation bilaterally. ? ?CVS: S1, S2 no murmurs, no S3.Regular rate. ? ?ABD: Soft non tender.  ? ?Ext: No edema ? ?MS: Adequate ROM spine, shoulders, hips and knees. ? ?Skin: Intact, no ulcerations or rash noted. ? ?Psych: Good eye contact, normal affect. Memory intact not anxious or depressed appearing. ? ?CNS: CN 2-12 intact, power,  normal throughout.no focal deficits noted. ? ? ?Assessment & Plan ? ?Essential hypertension ?Uncontrolled at visit, forgot medicstion today, rushing. No change ?DASH diet and commitment to daily physical  activity for a minimum of 30 minutes discussed and encouraged, as a part of hypertension management. ?The importance of attaining a healthy weight is also discussed. ? ?BP/Weight 01/04/2022 08/31/2021 07/13/2021 07/10/2021 05/22/2021 04/11/2021 04/10/2021  ?Systolic BP 211 941 740 814 138 117 114  ?Diastolic BP 82 81 76 82 87 77 68  ?Wt. (Lbs) 230.08 236 234 232.6 235 236 -  ?BMI 42.08 43.16 42.8 42.54 41.63 41.81 -  ? ? ? ? ? ?MORBID OBESITY ? ?Patient re-educated about  the importance of commitment to a  minimum of 150 minutes of exercise per week as able. ? ?The importance of healthy food choices with portion control discussed, as well as eating regularly and within a 12 hour window most days. ?The need to choose "clean , green" food 50 to 75% of the time is discussed, as well as to make water the primary drink and set a goal of 64 ounces water daily. ? ?  ?Weight /BMI 01/04/2022 08/31/2021 07/13/2021  ?WEIGHT 230 lb 1.3 oz 236 lb 234 lb  ?HEIGHT '5\' 2"'$  '5\' 2"'$  '5\' 2"'$   ?BMI 42.08 kg/m2 43.16 kg/m2 42.8 kg/m2  ? ? ?Add wegovy ? ?Prediabetes ?Patient educated about the importance of limiting  Carbohydrate intake , the need to commit to daily physical activity for a minimum of 30 minutes , and to commit weight loss. ?The fact that changes in all these areas will reduce or eliminate all together the development of diabetes is stressed.  ?Unchanged ?Diabetic Labs Latest Ref Rng & Units 12/31/2021 05/22/2021 04/06/2021  04/06/2021 07/27/2020  ?HbA1c 4.8 - 5.6 % 6.1(H) 6.1(H) - - 5.8(A)  ?Chol 100 - 199 mg/dL 176 190 - - -  ?HDL >39 mg/dL 40 43 - - -  ?Calc LDL 0 - 99 mg/dL 120(H) 129(H) - - -  ?Triglycerides 0 - 149 mg/dL 87 96 - - -  ?Creatinine 0.57 - 1.00 mg/dL 1.13(H) 1.29(H) 1.10(H) 1.10(H) -  ? ?BP/Weight 01/04/2022 08/31/2021 07/13/2021 07/10/2021 05/22/2021 04/11/2021 04/10/2021  ?Systolic BP 878 676 720 947 138 117 114  ?Diastolic BP 82 81 76 82 87 77 68  ?Wt. (Lbs) 230.08 236 234 232.6 235 236 -  ?BMI 42.08 43.16 42.8 42.54 41.63 41.81  -  ? ?No flowsheet data found. ? ? ? ?Hyperlipemia ?Hyperlipidemia:Low fat diet discussed and encouraged. ? ? ?Lipid Panel  ?Lab Results  ?Component Value Date  ? CHOL 176 12/31/2021  ? HDL 40 12/31/2021  ? LDLCALC 120 (H) 12/31/2021  ? TRIG 87 12/31/2021  ? CHOLHDL 4.4 12/31/2021  ? ?Change to cresrtor max dose ? ? ? ?Seasonal allergies ?No current flare responds to medication when occurs, uses as needed ? ?

## 2022-01-06 NOTE — Assessment & Plan Note (Signed)
No current flare responds to medication when occurs, uses as needed ?

## 2022-01-06 NOTE — Assessment & Plan Note (Signed)
?  Patient re-educated about  the importance of commitment to a  minimum of 150 minutes of exercise per week as able. ? ?The importance of healthy food choices with portion control discussed, as well as eating regularly and within a 12 hour window most days. ?The need to choose "clean , green" food 50 to 75% of the time is discussed, as well as to make water the primary drink and set a goal of 64 ounces water daily. ? ?  ?Weight /BMI 01/04/2022 08/31/2021 07/13/2021  ?WEIGHT 230 lb 1.3 oz 236 lb 234 lb  ?HEIGHT '5\' 2"'$  '5\' 2"'$  '5\' 2"'$   ?BMI 42.08 kg/m2 43.16 kg/m2 42.8 kg/m2  ? ? ?Add wegovy ?

## 2022-01-06 NOTE — Assessment & Plan Note (Signed)
Hyperlipidemia:Low fat diet discussed and encouraged. ? ? ?Lipid Panel  ?Lab Results  ?Component Value Date  ? CHOL 176 12/31/2021  ? HDL 40 12/31/2021  ? LDLCALC 120 (H) 12/31/2021  ? TRIG 87 12/31/2021  ? CHOLHDL 4.4 12/31/2021  ? ?Change to cresrtor max dose ? ? ?

## 2022-01-06 NOTE — Assessment & Plan Note (Signed)
Uncontrolled at visit, forgot medicstion today, rushing. No change ?DASH diet and commitment to daily physical activity for a minimum of 30 minutes discussed and encouraged, as a part of hypertension management. ?The importance of attaining a healthy weight is also discussed. ? ?BP/Weight 01/04/2022 08/31/2021 07/13/2021 07/10/2021 05/22/2021 04/11/2021 04/10/2021  ?Systolic BP 241 146 431 427 138 117 114  ?Diastolic BP 82 81 76 82 87 77 68  ?Wt. (Lbs) 230.08 236 234 232.6 235 236 -  ?BMI 42.08 43.16 42.8 42.54 41.63 41.81 -  ? ? ? ? ?

## 2022-01-06 NOTE — Assessment & Plan Note (Signed)
Patient educated about the importance of limiting  Carbohydrate intake , the need to commit to daily physical activity for a minimum of 30 minutes , and to commit weight loss. ?The fact that changes in all these areas will reduce or eliminate all together the development of diabetes is stressed.  ?Unchanged ?Diabetic Labs Latest Ref Rng & Units 12/31/2021 05/22/2021 04/06/2021 04/06/2021 07/27/2020  ?HbA1c 4.8 - 5.6 % 6.1(H) 6.1(H) - - 5.8(A)  ?Chol 100 - 199 mg/dL 176 190 - - -  ?HDL >39 mg/dL 40 43 - - -  ?Calc LDL 0 - 99 mg/dL 120(H) 129(H) - - -  ?Triglycerides 0 - 149 mg/dL 87 96 - - -  ?Creatinine 0.57 - 1.00 mg/dL 1.13(H) 1.29(H) 1.10(H) 1.10(H) -  ? ?BP/Weight 01/04/2022 08/31/2021 07/13/2021 07/10/2021 05/22/2021 04/11/2021 04/10/2021  ?Systolic BP 219 758 832 549 138 117 114  ?Diastolic BP 82 81 76 82 87 77 68  ?Wt. (Lbs) 230.08 236 234 232.6 235 236 -  ?BMI 42.08 43.16 42.8 42.54 41.63 41.81 -  ? ?No flowsheet data found. ? ? ?

## 2022-01-10 ENCOUNTER — Telehealth: Payer: Self-pay

## 2022-01-10 NOTE — Telephone Encounter (Signed)
Wegovy was approved.

## 2022-01-24 ENCOUNTER — Encounter: Payer: Self-pay | Admitting: Family Medicine

## 2022-01-25 ENCOUNTER — Other Ambulatory Visit: Payer: Self-pay | Admitting: *Deleted

## 2022-01-25 MED ORDER — TRIAMTERENE-HCTZ 75-50 MG PO TABS: 1.0000 | ORAL_TABLET | Freq: Every day | ORAL | 3 refills | Status: DC

## 2022-01-30 ENCOUNTER — Encounter: Payer: Self-pay | Admitting: Family Medicine

## 2022-01-30 ENCOUNTER — Telehealth (INDEPENDENT_AMBULATORY_CARE_PROVIDER_SITE_OTHER): Payer: BC Managed Care – PPO | Admitting: Family Medicine

## 2022-01-30 VITALS — BP 117/88 | Ht 63.0 in | Wt 231.0 lb

## 2022-01-30 DIAGNOSIS — J320 Chronic maxillary sinusitis: Secondary | ICD-10-CM

## 2022-01-30 MED ORDER — AMOXICILLIN-POT CLAVULANATE 875-125 MG PO TABS: 1.0000 | ORAL_TABLET | Freq: Two times a day (BID) | ORAL | 0 refills | Status: DC

## 2022-01-30 NOTE — Progress Notes (Addendum)
?  ? ?Virtual Visit via Telephone Note  ? ?This visit type was conducted due to national recommendations for restrictions regarding the COVID-19 Pandemic (e.g. social distancing) in an effort to limit this patient's exposure and mitigate transmission in our community.  Due to her co-morbid illnesses, this patient is at least at moderate risk for complications without adequate follow up.  This format is felt to be most appropriate for this patient at this time.  The patient did not have access to video technology/had technical difficulties with video requiring transitioning to audio format only (telephone).  All issues noted in this document were discussed and addressed.  No physical exam could be performed with this format.  Please refer to the patient's chart for her  consent to telehealth for [facility].  ? ?Evaluation Performed:  Acute visit ? ?Date:  01/30/2022  ? ?ID:  [name], DOB 1976-03-05, MRN [id number] ? ?Patient Location: Home ?Provider Location: Office/Clinic ? ?Participants: Patient ?Location of Patient: Home ?Location of Provider: Telehealth ?Consent was obtain for visit to be over via telehealth. ?I verified that I am speaking with the correct person using two identifiers. ? ?PCP:  Fayrene Helper, MD  ? ?Chief Complaint:  sinus infection and bronilitis for 2weeks ? ?History of Present Illness:   ? ?[name] is a 46 y.o. female with complaints of sinus pressure, pain, headache, cough, and congestion for two weeks. The patient reports taking Sudafed, Mucinex, Flonase, and saline lavage, with has provided moderated relief.The patient will be traveling Friday to [place] and wants treatment for her symptoms.  ?The patient does not have symptoms concerning for COVID-19 infection (fever, chills or new shortness of breath).  ? ?Past Medical, Surgical, Social History, Allergies, and Medications have been Reviewed. ? ?Past Medical History:  ?Diagnosis Date  ? Allergic rhinitis   ?  Gout   ? Hyperlipidemia   ? Hypertension   ? Intermittent palpitations 04/03/2020  ? Lumbar disc disease   ? Obesity   ? Syncope and collapse 09/27/2014  ? associated with over corrected htn, had cardiac and neurologic eval in 2015  ? ?Past Surgical History:  ?Procedure Laterality Date  ? CESAREAN SECTION    ? CHOLECYSTECTOMY    ? COLONOSCOPY WITH PROPOFOL N/A 04/10/2021  ? Procedure: COLONOSCOPY WITH PROPOFOL;  Surgeon: Eloise Harman, DO;  Location: [facility];  Service: Endoscopy;  Laterality: N/A;  ASA III / 9:30  ? LAPAROSCOPIC GASTRIC BANDING  08/18/2010  ?  ? ?Current Meds  ?Medication Sig  ? albuterol (VENTOLIN HFA) 108 (90 Base) MCG/ACT inhaler Inhale 1-2 puffs into the lungs every 4 (four) hours as needed for wheezing or shortness of breath.  ? allopurinol (ZYLOPRIM) 300 MG tablet TAKE 1 TABLET(300 MG) BY MOUTH DAILY  ? amLODipine (NORVASC) 5 MG tablet TAKE 1 TABLET(5 MG) BY MOUTH DAILY  ? amoxicillin-clavulanate (AUGMENTIN) 875-125 MG tablet Take 1 tablet by mouth 2 (two) times daily.  ? fluticasone (FLONASE) 50 MCG/ACT nasal spray Place 1 spray into both nostrils 2 (two) times daily. (Patient taking differently: Place 1 spray into both nostrils 2 (two) times daily as needed for allergies.)  ? Multiple Vitamin (MULTIVITAMIN WITH MINERALS) TABS tablet Take 1 tablet by mouth every evening.  ? rosuvastatin (CRESTOR) 40 MG tablet Take 1 tablet (40 mg total) by mouth daily.  ? Semaglutide-Weight Management 0.25 MG/0.5ML SOAJ Inject 0.25 mg into the skin once a week for 28 days.  ? [START ON 02/02/2022] Semaglutide-Weight Management  0.5 MG/0.5ML SOAJ Inject 0.5 mg into the skin once a week for 28 days.  ? triamterene-hydrochlorothiazide (MAXZIDE) 75-50 MG tablet Take 1 tablet by mouth daily.  ? ?Current Facility-Administered Medications for the 01/30/22 encounter (Video Visit) with Alvira Monday, [place]  ?Medication  ? methylPREDNISolone acetate (DEPO-MEDROL) injection 80 mg  ?  ? ?Allergies:    Hydrocodone-acetaminophen and Hydrocodone-acetaminophen  ? ?ROS:   ?Please see the history of present illness.    ? ?All other systems reviewed and are negative. ? ? ?Labs/Other Tests and Data Reviewed:   ? ?Recent Labs: ?05/22/2021: Hemoglobin 14.8; Platelets 214; TSH 2.850 ?12/31/2021: ALT 18; BUN 17; Creatinine, Ser 1.13; Potassium 3.6; Sodium 139  ? ?Recent Lipid Panel ?Lab Results  ?Component Value Date/Time  ? CHOL 176 12/31/2021 08:34 AM  ? TRIG 87 12/31/2021 08:34 AM  ? HDL 40 12/31/2021 08:34 AM  ? CHOLHDL 4.4 12/31/2021 08:34 AM  ? CHOLHDL 3.9 11/26/2019 08:33 AM  ? LDLCALC 120 (H) 12/31/2021 08:34 AM  ? LDLCALC 99 11/26/2019 08:33 AM  ? ? ?Wt Readings from Last 3 Encounters:  ?01/30/22 231 lb (104.8 kg)  ?01/04/22 230 lb 1.3 oz (104.4 kg)  ?08/31/21 236 lb (107 kg)  ?  ? ?Objective:   ? ?Vital Signs:  BP 117/88 (BP Location: Left Arm, Patient Position: Sitting) Comment: pt provided.  Ht '5\' 3"'$  (1.6 m)   Wt 231 lb (104.8 kg)   BMI 40.92 kg/m?   ? ? ? ?ASSESSMENT & PLAN:   ? ?Sinusitis ?Continue supportive care and symptoms management ?Advised to take Augmentin as indicated ? ? ?Time:   ?Today, I have spent 20 minutes reviewing the chart, including problem list, medications, and with the patient with telehealth technology discussing the above problems. ? ? ?Medication Adjustments/Labs and Tests Ordered: ?Current medicines are reviewed at length with the patient today.  Concerns regarding medicines are outlined above.  ? ?Tests Ordered: ?No orders of the defined types were placed in this encounter. ? ? ?Medication Changes: ?Meds ordered this encounter  ?Medications  ? amoxicillin-clavulanate (AUGMENTIN) 875-125 MG tablet  ?  Sig: Take 1 tablet by mouth 2 (two) times daily.  ?  Dispense:  14 tablet  ?  Refill:  0  ? ? ? ?Note: This dictation was prepared with Dragon dictation along with smaller phrase technology. Similar sounding words can be transcribed inadequately or may not be corrected upon review. Any  transcriptional errors that result from this process are unintentional.  ?  ? ? ?Disposition:  Follow-up if symptoms worsen  ?Signed, ?Alvira Monday, FNP  ?01/30/2022 8:54 AM    ? ?[facility] ?[facility] ?

## 2022-01-30 NOTE — Assessment & Plan Note (Signed)
Continue supportive care and symptomatic management ?Take antibiotic as prescribed ?

## 2022-02-26 ENCOUNTER — Ambulatory Visit: Payer: BC Managed Care – PPO | Admitting: Family Medicine

## 2022-03-20 ENCOUNTER — Ambulatory Visit (INDEPENDENT_AMBULATORY_CARE_PROVIDER_SITE_OTHER): Payer: BC Managed Care – PPO | Admitting: Family Medicine

## 2022-03-20 VITALS — BP 128/83 | HR 100 | Resp 16 | Ht 63.0 in | Wt 227.4 lb

## 2022-03-20 DIAGNOSIS — R7303 Prediabetes: Secondary | ICD-10-CM | POA: Diagnosis not present

## 2022-03-20 DIAGNOSIS — E559 Vitamin D deficiency, unspecified: Secondary | ICD-10-CM | POA: Diagnosis not present

## 2022-03-20 DIAGNOSIS — Z13220 Encounter for screening for lipoid disorders: Secondary | ICD-10-CM

## 2022-03-20 DIAGNOSIS — R7301 Impaired fasting glucose: Secondary | ICD-10-CM

## 2022-03-20 DIAGNOSIS — I10 Essential (primary) hypertension: Secondary | ICD-10-CM

## 2022-03-20 DIAGNOSIS — E8881 Metabolic syndrome: Secondary | ICD-10-CM

## 2022-03-20 DIAGNOSIS — E7800 Pure hypercholesterolemia, unspecified: Secondary | ICD-10-CM

## 2022-03-20 MED ORDER — SEMAGLUTIDE-WEIGHT MANAGEMENT 0.25 MG/0.5ML ~~LOC~~ SOAJ
0.2500 mg | SUBCUTANEOUS | 0 refills | Status: DC
Start: 2022-03-20 — End: 2022-06-05

## 2022-03-20 NOTE — Patient Instructions (Signed)
F/U in  10 weeks, call if you need me before  Weight loss goal of 8 pounds  It is important that you exercise regularly at least 30 minutes 5 times a week. If you develop chest pain, have severe difficulty breathing, or feel very tired, stop exercising immediately and seek medical attention   Lower dose wegovy is prescribed  Fasting CBC, lipid, cmp and EGFR, HBA1C, TSH and vit D 5 days before next visit  Thanks for choosing [facility], we consider it a privelige to serve you.

## 2022-03-24 ENCOUNTER — Encounter: Payer: Self-pay | Admitting: Family Medicine

## 2022-03-24 NOTE — Assessment & Plan Note (Signed)
  Patient re-educated about  the importance of commitment to a  minimum of 150 minutes of exercise per week as able.  The importance of healthy food choices with portion control discussed, as well as eating regularly and within a 12 hour window most days. The need to choose "clean , green" food 50 to 75% of the time is discussed, as well as to make water the primary drink and set a goal of 64 ounces water daily.       03/20/2022    4:02 PM 01/30/2022    8:12 AM 01/04/2022    2:18 PM  Weight /BMI  Weight 227 lb 6.4 oz 231 lb 230 lb 1.3 oz  Height '5\' 3"'$  (1.6 m) '5\' 3"'$  (1.6 m) '5\' 2"'$  (1.575 m)  BMI 40.28 kg/m2 40.92 kg/m2 42.08 kg/m2

## 2022-03-24 NOTE — Assessment & Plan Note (Signed)
Controlled, no change in medication DASH diet and commitment to daily physical activity for a minimum of 30 minutes discussed and encouraged, as a part of hypertension management. The importance of attaining a healthy weight is also discussed.     03/20/2022    4:02 PM 01/30/2022    8:12 AM 01/04/2022    2:18 PM 08/31/2021    2:12 PM 07/13/2021    2:48 PM 07/10/2021    8:52 AM 05/22/2021    8:34 AM  BP/Weight  Systolic BP 973 532 992 426 834 196 222  Diastolic BP 83 88 82 81 76 82 87  Wt. (Lbs) 227.4 231 230.08 236 234 232.6 235  BMI 40.28 kg/m2 40.92 kg/m2 42.08 kg/m2 43.16 kg/m2 42.8 kg/m2 42.54 kg/m2 41.63 kg/m2

## 2022-03-24 NOTE — Progress Notes (Signed)
[name]     MRN: [id number]      DOB: 12-12-1975   HPI [name] is here for follow up and re-evaluation of chronic medical conditions, medication management and review of any available recent lab and radiology data.  Preventive health is updated, specifically  Cancer screening and Immunization.   Questions or concerns regarding consultations or procedures which the PT has had in the interim are  addressed. The PT denies any adverse reactions to current medications since the last visit.  One of her jobs has ended temporarily, and she intends tobe diligent with exercise to facilitate improved health and weight loss   ROS Denies recent fever or chills. Denies sinus pressure, nasal congestion, ear pain or sore throat. Denies chest congestion, productive cough or wheezing. Denies chest pains, palpitations and leg swelling Denies abdominal pain, nausea, vomiting,diarrhea or constipation.   Denies dysuria, frequency, hesitancy or incontinence. Denies joint pain, swelling and limitation in mobility. Denies headaches, seizures, numbness, or tingling. Denies depression, anxiety or insomnia. Denies skin break down or rash.   PE  BP 128/83   Pulse 100   Resp 16   Ht '5\' 3"'$  (1.6 m)   Wt 227 lb 6.4 oz (103.1 kg)   SpO2 97%   BMI 40.28 kg/m   Patient alert and oriented and in no cardiopulmonary distress.  HEENT: No facial asymmetry, EOMI,     Neck supple .  Chest: Clear to auscultation bilaterally.  CVS: S1, S2 no murmurs, no S3.Regular rate.  ABD: Soft non tender.   Ext: No edema  MS: Adequate ROM spine, shoulders, hips and knees.  Skin: Intact, no ulcerations or rash noted.  Psych: Good eye contact, normal affect. Memory intact not anxious or depressed appearing.  CNS: CN 2-12 intact, power,  normal throughout.no focal deficits noted.   Assessment & Plan  Essential hypertension Controlled, no change in medication DASH diet and commitment to daily  physical activity for a minimum of 30 minutes discussed and encouraged, as a part of hypertension management. The importance of attaining a healthy weight is also discussed.     03/20/2022    4:02 PM 01/30/2022    8:12 AM 01/04/2022    2:18 PM 08/31/2021    2:12 PM 07/13/2021    2:48 PM 07/10/2021    8:52 AM 05/22/2021    8:34 AM  BP/Weight  Systolic BP 242 683 419 622 297 989 211  Diastolic BP 83 88 82 81 76 82 87  Wt. (Lbs) 227.4 231 230.08 236 234 232.6 235  BMI 40.28 kg/m2 40.92 kg/m2 42.08 kg/m2 43.16 kg/m2 42.8 kg/m2 42.54 kg/m2 41.63 kg/m2       MORBID OBESITY  Patient re-educated about  the importance of commitment to a  minimum of 150 minutes of exercise per week as able.  The importance of healthy food choices with portion control discussed, as well as eating regularly and within a 12 hour window most days. The need to choose "clean , green" food 50 to 75% of the time is discussed, as well as to make water the primary drink and set a goal of 64 ounces water daily.       03/20/2022    4:02 PM 01/30/2022    8:12 AM 01/04/2022    2:18 PM  Weight /BMI  Weight 227 lb 6.4 oz 231 lb 230 lb 1.3 oz  Height '5\' 3"'$  (1.6 m) '5\' 3"'$  (1.6 m) '5\' 2"'$  (1.575 m)  BMI 40.28 kg/m2  40.92 kg/m2 42.08 kg/m2      Prediabetes Patient educated about the importance of limiting  Carbohydrate intake , the need to commit to daily physical activity for a minimum of 30 minutes , and to commit weight loss. The fact that changes in all these areas will reduce or eliminate all together the development of diabetes is stressed.      Latest Ref Rng & Units 12/31/2021    8:34 AM 05/22/2021    9:35 AM 04/06/2021   10:35 AM 04/06/2021   10:00 AM 07/27/2020    8:53 AM  Diabetic Labs  HbA1c 4.8 - 5.6 % 6.1   6.1     5.8    Chol 100 - 199 mg/dL 176   190       HDL >39 mg/dL 40   43       Calc LDL 0 - 99 mg/dL 120   129       Triglycerides 0 - 149 mg/dL 87   96       Creatinine 0.57 - 1.00 mg/dL 1.13   1.29    1.10   1.10         03/20/2022    4:02 PM 01/30/2022    8:12 AM 01/04/2022    2:18 PM 08/31/2021    2:12 PM 07/13/2021    2:48 PM 07/10/2021    8:52 AM 05/22/2021    8:34 AM  BP/Weight  Systolic BP 588 502 774 128 786 767 209  Diastolic BP 83 88 82 81 76 82 87  Wt. (Lbs) 227.4 231 230.08 236 234 232.6 235  BMI 40.28 kg/m2 40.92 kg/m2 42.08 kg/m2 43.16 kg/m2 42.8 kg/m2 42.54 kg/m2 41.63 kg/m2       View : No data to display.            Hyperlipemia Hyperlipidemia:Low fat diet discussed and encouraged.   Lipid Panel  Lab Results  Component Value Date   CHOL 176 12/31/2021   HDL 40 12/31/2021   LDLCALC 120 (H) 12/31/2021   TRIG 87 12/31/2021   CHOLHDL 4.4 12/31/2021     Needs to  Reduce fat in diet  Metabolic syndrome X The increased risk of cardiovascular disease associated with this diagnosis, and the need to consistently work on lifestyle to change this is discussed. Following  a  heart healthy diet ,commitment to 30 minutes of exercise at least 5 days per week, as well as control of blood sugar and cholesterol , and achieving a healthy weight are all the areas to be addressed .

## 2022-03-24 NOTE — Assessment & Plan Note (Signed)
Hyperlipidemia:Low fat diet discussed and encouraged.   Lipid Panel  Lab Results  Component Value Date   CHOL 176 12/31/2021   HDL 40 12/31/2021   LDLCALC 120 (H) 12/31/2021   TRIG 87 12/31/2021   CHOLHDL 4.4 12/31/2021     Needs to  Reduce fat in diet

## 2022-03-24 NOTE — Assessment & Plan Note (Signed)
The increased risk of cardiovascular disease associated with this diagnosis, and the need to consistently work on lifestyle to change this is discussed. Following  a  heart healthy diet ,commitment to 30 minutes of exercise at least 5 days per week, as well as control of blood sugar and cholesterol , and achieving a healthy weight are all the areas to be addressed .  

## 2022-03-24 NOTE — Assessment & Plan Note (Signed)
Patient educated about the importance of limiting  Carbohydrate intake , the need to commit to daily physical activity for a minimum of 30 minutes , and to commit weight loss. The fact that changes in all these areas will reduce or eliminate all together the development of diabetes is stressed.      Latest Ref Rng & Units 12/31/2021    8:34 AM 05/22/2021    9:35 AM 04/06/2021   10:35 AM 04/06/2021   10:00 AM 07/27/2020    8:53 AM  Diabetic Labs  HbA1c 4.8 - 5.6 % 6.1   6.1     5.8    Chol 100 - 199 mg/dL 176   190       HDL >39 mg/dL 40   43       Calc LDL 0 - 99 mg/dL 120   129       Triglycerides 0 - 149 mg/dL 87   96       Creatinine 0.57 - 1.00 mg/dL 1.13   1.29   1.10   1.10         03/20/2022    4:02 PM 01/30/2022    8:12 AM 01/04/2022    2:18 PM 08/31/2021    2:12 PM 07/13/2021    2:48 PM 07/10/2021    8:52 AM 05/22/2021    8:34 AM  BP/Weight  Systolic BP 592 924 462 863 817 711 657  Diastolic BP 83 88 82 81 76 82 87  Wt. (Lbs) 227.4 231 230.08 236 234 232.6 235  BMI 40.28 kg/m2 40.92 kg/m2 42.08 kg/m2 43.16 kg/m2 42.8 kg/m2 42.54 kg/m2 41.63 kg/m2       View : No data to display.

## 2022-04-18 ENCOUNTER — Other Ambulatory Visit: Payer: Self-pay | Admitting: Orthopaedic Surgery

## 2022-05-10 ENCOUNTER — Encounter: Payer: Self-pay | Admitting: Family Medicine

## 2022-05-13 ENCOUNTER — Other Ambulatory Visit: Payer: Self-pay | Admitting: *Deleted

## 2022-05-13 MED ORDER — TRIAMTERENE-HCTZ 75-50 MG PO TABS: 1.0000 | ORAL_TABLET | Freq: Every day | ORAL | 1 refills | Status: DC

## 2022-06-05 ENCOUNTER — Ambulatory Visit (INDEPENDENT_AMBULATORY_CARE_PROVIDER_SITE_OTHER): Payer: BC Managed Care – PPO | Admitting: Family Medicine

## 2022-06-05 ENCOUNTER — Encounter: Payer: Self-pay | Admitting: Family Medicine

## 2022-06-05 VITALS — BP 118/82 | HR 94 | Resp 16 | Ht 62.0 in | Wt 226.0 lb

## 2022-06-05 DIAGNOSIS — R7303 Prediabetes: Secondary | ICD-10-CM | POA: Diagnosis not present

## 2022-06-05 DIAGNOSIS — E7800 Pure hypercholesterolemia, unspecified: Secondary | ICD-10-CM

## 2022-06-05 DIAGNOSIS — I10 Essential (primary) hypertension: Secondary | ICD-10-CM | POA: Diagnosis not present

## 2022-06-05 DIAGNOSIS — E559 Vitamin D deficiency, unspecified: Secondary | ICD-10-CM

## 2022-06-05 MED ORDER — AZELASTINE HCL 0.1 % NA SOLN: 2.0000 | Freq: Two times a day (BID) | NASAL | 12 refills | Status: DC

## 2022-06-05 NOTE — Patient Instructions (Signed)
F/u in 4 months, call if you need me before   Please get fasting labs this week   It is important that you exercise regularly at least 30 minutes 5 times a week. If you develop chest pain, have severe difficulty breathing, or feel very tired, stop exercising immediately and seek medical attention   Think about what you will eat, plan ahead. Choose " clean, green, fresh or frozen" over canned, processed or packaged foods which are more sugary, salty and fatty. 70 to 75% of food eaten should be vegetables and fruit. Three meals at set times with snacks allowed between meals, but they must be fruit or vegetables. Aim to eat over a 12 hour period , example 7 am to 7 pm, and STOP after  your last meal of the day. Drink water,generally about 64 ounces per day, no other drink is as healthy. Fruit juice is best enjoyed in a healthy way, by EATING the fruit.   Thanks for choosing [facility], we consider it a privelige to serve you.

## 2022-06-07 LAB — LIPID PANEL
Chol/HDL Ratio: 3.5 ratio (ref 0.0–4.4)
Cholesterol, Total: 138 mg/dL (ref 100–199)
HDL: 40 mg/dL (ref >39)
LDL Chol Calc (NIH): 81 mg/dL (ref 0–99)
Triglycerides: 89 mg/dL (ref 0–149)
VLDL Cholesterol Cal: 17 mg/dL (ref 5–40)

## 2022-06-07 LAB — CBC
Hematocrit: 38.6 % (ref 34.0–46.6)
Hemoglobin: 13.2 g/dL (ref 11.1–15.9)
MCH: 29.3 pg (ref 26.6–33.0)
MCHC: 34.2 g/dL (ref 31.5–35.7)
MCV: 86 fL (ref 79–97)
Platelets: 192 10*3/uL (ref 150–450)
RBC: 4.5 x10E6/uL (ref 3.77–5.28)
RDW: 13.2 % (ref 11.7–15.4)
WBC: 8.9 10*3/uL (ref 3.4–10.8)

## 2022-06-07 LAB — HEMOGLOBIN A1C
Est. average glucose Bld gHb Est-mCnc: 128 mg/dL
Hgb A1c MFr Bld: 6.1 % — ABNORMAL HIGH (ref 4.8–5.6)

## 2022-06-07 LAB — CMP14+EGFR
ALT: 37 IU/L — ABNORMAL HIGH (ref 0–32)
AST: 22 IU/L (ref 0–40)
Albumin/Globulin Ratio: 1.4 (ref 1.2–2.2)
Albumin: 3.8 g/dL — ABNORMAL LOW (ref 3.9–4.9)
Alkaline Phosphatase: 89 IU/L (ref 44–121)
BUN/Creatinine Ratio: 13 (ref 9–23)
BUN: 14 mg/dL (ref 6–24)
Bilirubin Total: 0.3 mg/dL (ref 0.0–1.2)
CO2: 27 mmol/L (ref 20–29)
Calcium: 9.6 mg/dL (ref 8.7–10.2)
Chloride: 100 mmol/L (ref 96–106)
Creatinine, Ser: 1.09 mg/dL — ABNORMAL HIGH (ref 0.57–1.00)
Globulin, Total: 2.7 g/dL (ref 1.5–4.5)
Glucose: 90 mg/dL (ref 70–99)
Potassium: 3.6 mmol/L (ref 3.5–5.2)
Sodium: 140 mmol/L (ref 134–144)
Total Protein: 6.5 g/dL (ref 6.0–8.5)
eGFR: 63 mL/min/{1.73_m2} (ref >59)

## 2022-06-07 LAB — TSH: TSH: 1.69 u[IU]/mL (ref 0.450–4.500)

## 2022-06-07 LAB — VITAMIN D 25 HYDROXY (VIT D DEFICIENCY, FRACTURES): Vit D, 25-Hydroxy: 30.7 ng/mL (ref 30.0–100.0)

## 2022-06-09 ENCOUNTER — Encounter: Payer: Self-pay | Admitting: Family Medicine

## 2022-06-09 NOTE — Progress Notes (Signed)
[name]     MRN: [id number]      DOB: [birth year]   HPI [name] is here for follow up and re-evaluation of chronic medical conditions, medication management and review of any available recent lab and radiology data.  Preventive health is updated, specifically  Cancer screening and Immunization.   Questions or concerns regarding consultations or procedures which the PT has had in the interim are  addressed. The PT denies any adverse reactions to current medications since the last visit.  There are no new concerns.  There are no specific complaints   ROS Denies recent fever or chills. Denies sinus pressure, nasal congestion, ear pain or sore throat. Denies chest congestion, productive cough or wheezing. Denies chest pains, palpitations and leg swelling Denies abdominal pain, nausea, vomiting,diarrhea or constipation.   Denies dysuria, frequency, hesitancy or incontinence. Denies joint pain, swelling and limitation in mobility. Denies headaches, seizures, numbness, or tingling. Denies depression, anxiety or insomnia. Denies skin break down or rash.   PE  BP 118/82   Pulse 94   Resp 16   Ht '5\' 2"'$  (1.575 m)   Wt 226 lb (102.5 kg)   SpO2 98%   BMI 41.34 kg/m   Patient alert and oriented and in no cardiopulmonary distress.  HEENT: No facial asymmetry, EOMI,     Neck supple .  Chest: Clear to auscultation bilaterally.  CVS: S1, S2 no murmurs, no S3.Regular rate.  ABD: Soft non tender.   Ext: No edema  MS: Adequate ROM spine, shoulders, hips and knees.  Skin: Intact, no ulcerations or rash noted.  Psych: Good eye contact, normal affect. Memory intact not anxious or depressed appearing.  CNS: CN 2-12 intact, power,  normal throughout.no focal deficits noted.   Assessment & Plan  Essential hypertension Controlled, no change in medication DASH diet and commitment to daily physical activity for a minimum of 30 minutes discussed and encouraged, as a  part of hypertension management. The importance of attaining a healthy weight is also discussed.     06/05/2022    4:14 PM 03/20/2022    4:02 PM 01/30/2022    8:12 AM 01/04/2022    2:18 PM 08/31/2021    2:12 PM 07/13/2021    2:48 PM 07/10/2021    8:52 AM  BP/Weight  Systolic BP 656 812 751 700 174 944 967  Diastolic BP 82 83 88 82 81 76 82  Wt. (Lbs) 226 227.4 231 230.08 236 234 232.6  BMI 41.34 kg/m2 40.28 kg/m2 40.92 kg/m2 42.08 kg/m2 43.16 kg/m2 42.8 kg/m2 42.54 kg/m2     '  MORBID OBESITY  Patient re-educated about  the importance of commitment to a  minimum of 150 minutes of exercise per week as able.  The importance of healthy food choices with portion control discussed, as well as eating regularly and within a 12 hour window most days. The need to choose "clean , green" food 50 to 75% of the time is discussed, as well as to make water the primary drink and set a goal of 64 ounces water daily.       06/05/2022    4:14 PM 03/20/2022    4:02 PM 01/30/2022    8:12 AM  Weight /BMI  Weight 226 lb 227 lb 6.4 oz 231 lb  Height '5\' 2"'$  (1.575 m) '5\' 3"'$  (1.6 m) '5\' 3"'$  (1.6 m)  BMI 41.34 kg/m2 40.28 kg/m2 40.92 kg/m2      Prediabetes Patient educated about the  importance of limiting  Carbohydrate intake , the need to commit to daily physical activity for a minimum of 30 minutes , and to commit weight loss. The fact that changes in all these areas will reduce or eliminate all together the development of diabetes is stressed.  Unnchanged    Latest Ref Rng & Units 06/06/2022   10:58 AM 12/31/2021    8:34 AM 05/22/2021    9:35 AM 04/06/2021   10:35 AM 04/06/2021   10:00 AM  Diabetic Labs  HbA1c 4.8 - 5.6 % 6.1  6.1  6.1     Chol 100 - 199 mg/dL 138  176  190     HDL >39 mg/dL 40  40  43     Calc LDL 0 - 99 mg/dL 81  120  129     Triglycerides 0 - 149 mg/dL 89  87  96     Creatinine 0.57 - 1.00 mg/dL 1.09  1.13  1.29  1.10  1.10       06/05/2022    4:14 PM 03/20/2022    4:02 PM 01/30/2022     8:12 AM 01/04/2022    2:18 PM 08/31/2021    2:12 PM 07/13/2021    2:48 PM 07/10/2021    8:52 AM  BP/Weight  Systolic BP 325 498 264 158 309 407 680  Diastolic BP 82 83 88 82 81 76 82  Wt. (Lbs) 226 227.4 231 230.08 236 234 232.6  BMI 41.34 kg/m2 40.28 kg/m2 40.92 kg/m2 42.08 kg/m2 43.16 kg/m2 42.8 kg/m2 42.54 kg/m2       No data to display            Hyperlipemia Hyperlipidemia:Low fat diet discussed and encouraged.   Lipid Panel  Lab Results  Component Value Date   CHOL 138 06/06/2022   HDL 40 06/06/2022   LDLCALC 81 06/06/2022   TRIG 89 06/06/2022   CHOLHDL 3.5 06/06/2022     Controlled, no change in medication   Vitamin D deficiency Needs daily supplement , 1000  IU

## 2022-06-09 NOTE — Assessment & Plan Note (Signed)
Hyperlipidemia:Low fat diet discussed and encouraged.   Lipid Panel  Lab Results  Component Value Date   CHOL 138 06/06/2022   HDL 40 06/06/2022   LDLCALC 81 06/06/2022   TRIG 89 06/06/2022   CHOLHDL 3.5 06/06/2022     Controlled, no change in medication  

## 2022-06-09 NOTE — Assessment & Plan Note (Signed)
Controlled, no change in medication DASH diet and commitment to daily physical activity for a minimum of 30 minutes discussed and encouraged, as a part of hypertension management. The importance of attaining a healthy weight is also discussed.     06/05/2022    4:14 PM 03/20/2022    4:02 PM 01/30/2022    8:12 AM 01/04/2022    2:18 PM 08/31/2021    2:12 PM 07/13/2021    2:48 PM 07/10/2021    8:52 AM  BP/Weight  Systolic BP 335 456 256 389 373 428 768  Diastolic BP 82 83 88 82 81 76 82  Wt. (Lbs) 226 227.4 231 230.08 236 234 232.6  BMI 41.34 kg/m2 40.28 kg/m2 40.92 kg/m2 42.08 kg/m2 43.16 kg/m2 42.8 kg/m2 42.54 kg/m2     '

## 2022-06-09 NOTE — Assessment & Plan Note (Signed)
Patient educated about the importance of limiting  Carbohydrate intake , the need to commit to daily physical activity for a minimum of 30 minutes , and to commit weight loss. The fact that changes in all these areas will reduce or eliminate all together the development of diabetes is stressed.  Unnchanged    Latest Ref Rng & Units 06/06/2022   10:58 AM 12/31/2021    8:34 AM 05/22/2021    9:35 AM 04/06/2021   10:35 AM 04/06/2021   10:00 AM  Diabetic Labs  HbA1c 4.8 - 5.6 % 6.1  6.1  6.1     Chol 100 - 199 mg/dL 138  176  190     HDL >39 mg/dL 40  40  43     Calc LDL 0 - 99 mg/dL 81  120  129     Triglycerides 0 - 149 mg/dL 89  87  96     Creatinine 0.57 - 1.00 mg/dL 1.09  1.13  1.29  1.10  1.10       06/05/2022    4:14 PM 03/20/2022    4:02 PM 01/30/2022    8:12 AM 01/04/2022    2:18 PM 08/31/2021    2:12 PM 07/13/2021    2:48 PM 07/10/2021    8:52 AM  BP/Weight  Systolic BP 828 003 491 791 505 697 948  Diastolic BP 82 83 88 82 81 76 82  Wt. (Lbs) 226 227.4 231 230.08 236 234 232.6  BMI 41.34 kg/m2 40.28 kg/m2 40.92 kg/m2 42.08 kg/m2 43.16 kg/m2 42.8 kg/m2 42.54 kg/m2       No data to display

## 2022-06-09 NOTE — Assessment & Plan Note (Signed)
Needs daily supplement , 1000  IU

## 2022-06-09 NOTE — Assessment & Plan Note (Signed)
  Patient re-educated about  the importance of commitment to a  minimum of 150 minutes of exercise per week as able.  The importance of healthy food choices with portion control discussed, as well as eating regularly and within a 12 hour window most days. The need to choose "clean , green" food 50 to 75% of the time is discussed, as well as to make water the primary drink and set a goal of 64 ounces water daily.       06/05/2022    4:14 PM 03/20/2022    4:02 PM 01/30/2022    8:12 AM  Weight /BMI  Weight 226 lb 227 lb 6.4 oz 231 lb  Height '5\' 2"'$  (1.575 m) '5\' 3"'$  (1.6 m) '5\' 3"'$  (1.6 m)  BMI 41.34 kg/m2 40.28 kg/m2 40.92 kg/m2

## 2022-06-14 ENCOUNTER — Other Ambulatory Visit: Payer: Self-pay | Admitting: Family Medicine

## 2022-09-28 ENCOUNTER — Encounter: Payer: Self-pay | Admitting: Family Medicine

## 2022-09-30 ENCOUNTER — Other Ambulatory Visit: Payer: Self-pay

## 2022-09-30 MED ORDER — AMLODIPINE BESYLATE 5 MG PO TABS: ORAL_TABLET | ORAL | 1 refills | Status: DC

## 2022-10-08 ENCOUNTER — Encounter: Payer: Self-pay | Admitting: Family Medicine

## 2022-10-08 ENCOUNTER — Ambulatory Visit (INDEPENDENT_AMBULATORY_CARE_PROVIDER_SITE_OTHER): Payer: BC Managed Care – PPO | Admitting: Family Medicine

## 2022-10-08 VITALS — BP 136/82 | HR 90 | Ht 62.0 in | Wt 230.0 lb

## 2022-10-08 DIAGNOSIS — Z23 Encounter for immunization: Secondary | ICD-10-CM | POA: Diagnosis not present

## 2022-10-08 DIAGNOSIS — R7303 Prediabetes: Secondary | ICD-10-CM | POA: Diagnosis not present

## 2022-10-08 DIAGNOSIS — E7800 Pure hypercholesterolemia, unspecified: Secondary | ICD-10-CM

## 2022-10-08 DIAGNOSIS — Z13220 Encounter for screening for lipoid disorders: Secondary | ICD-10-CM

## 2022-10-08 DIAGNOSIS — I10 Essential (primary) hypertension: Secondary | ICD-10-CM

## 2022-10-08 DIAGNOSIS — J302 Other seasonal allergic rhinitis: Secondary | ICD-10-CM

## 2022-10-08 MED ORDER — AMLODIPINE BESYLATE 2.5 MG PO TABS: 2.5000 mg | ORAL_TABLET | Freq: Every day | ORAL | 0 refills | Status: DC

## 2022-10-08 MED ORDER — LORATADINE 10 MG PO TABS: 10.0000 mg | ORAL_TABLET | Freq: Every day | ORAL | 1 refills | Status: DC

## 2022-10-08 NOTE — Patient Instructions (Signed)
F/U in 6 to 7 weeks, re eval BP call; if you need me sooner  New additional med for blood pressure, amlodipine 2.5 mg , continue amlodipine 5 mg daily  Loratidine prescribed for allergies, may take up to twice daily  Fasdting lipid, cmp and eGFr and hBQ1C 3 to 5 days befiore next visit

## 2022-10-10 ENCOUNTER — Other Ambulatory Visit: Payer: Self-pay | Admitting: Family Medicine

## 2022-10-10 MED ORDER — FLUTICASONE PROPIONATE 50 MCG/ACT NA SUSP: 1.0000 | Freq: Two times a day (BID) | NASAL | 0 refills | Status: DC

## 2022-10-12 ENCOUNTER — Encounter: Payer: Self-pay | Admitting: Family Medicine

## 2022-10-12 NOTE — Assessment & Plan Note (Signed)
Uncontrolled, add amlodipine 2.5 mg  Daily DASH diet and commitment to daily physical activity for a minimum of 30 minutes discussed and encouraged, as a part of hypertension management. The importance of attaining a healthy weight is also discussed.     10/08/2022    4:21 PM 06/05/2022    4:14 PM 03/20/2022    4:02 PM 01/30/2022    8:12 AM 01/04/2022    2:18 PM 08/31/2021    2:12 PM 07/13/2021    2:48 PM  BP/Weight  Systolic BP 438 381 840 375 436 067 703  Diastolic BP 82 82 83 88 82 81 76  Wt. (Lbs) 230.04 226 227.4 231 230.08 236 234  BMI 42.07 kg/m2 41.34 kg/m2 40.28 kg/m2 40.92 kg/m2 42.08 kg/m2 43.16 kg/m2 42.8 kg/m2

## 2022-10-12 NOTE — Assessment & Plan Note (Signed)
Uncontrolled ,start daily loratidine

## 2022-10-12 NOTE — Progress Notes (Signed)
[name]     MRN: [id number]      DOB: 1976-06-22   HPI [name] is here for follow up and re-evaluation of chronic medical conditions, medication management and review of any available recent lab and radiology data.  Preventive health is updated, specifically  Cancer screening and Immunization.   The PT denies any adverse reactions to current medications since the last visit.  C/o uncontrolled allergy symptoms There are no specific complaints   ROS Denies recent fever or chills. Denies sinus pressure, nasal congestion, ear pain or sore throat. Denies chest congestion, productive cough or wheezing. Denies chest pains, palpitations and leg swelling Denies abdominal pain, nausea, vomiting,diarrhea or constipation.   Denies dysuria, frequency, hesitancy or incontinence. Denies joint pain, swelling and limitation in mobility. Denies headaches, seizures, numbness, or tingling. Denies depression, anxiety or insomnia. Denies skin break down or rash.   PE  BP 136/82 (BP Location: Right Arm, Patient Position: Sitting, Cuff Size: Large)   Pulse 90   Ht '5\' 2"'$  (1.575 m)   Wt 230 lb 0.6 oz (104.3 kg)   SpO2 96%   BMI 42.07 kg/m   Patient alert and oriented and in no cardiopulmonary distress.  HEENT: No facial asymmetry, EOMI,     Neck supple .  Chest: Clear to auscultation bilaterally.  CVS: S1, S2 no murmurs, no S3.Regular rate.  ABD: Soft non tender.   Ext: No edema  MS: Adequate ROM spine, shoulders, hips and knees.  Skin: Intact, no ulcerations or rash noted.  Psych: Good eye contact, normal affect. Memory intact not anxious or depressed appearing.  CNS: CN 2-12 intact, power,  normal throughout.no focal deficits noted.   Assessment & Plan  Essential hypertension Uncontrolled, add amlodipine 2.5 mg  Daily DASH diet and commitment to daily physical activity for a minimum of 30 minutes discussed and encouraged, as a part of hypertension  management. The importance of attaining a healthy weight is also discussed.     10/08/2022    4:21 PM 06/05/2022    4:14 PM 03/20/2022    4:02 PM 01/30/2022    8:12 AM 01/04/2022    2:18 PM 08/31/2021    2:12 PM 07/13/2021    2:48 PM  BP/Weight  Systolic BP 409 811 914 782 956 213 086  Diastolic BP 82 82 83 88 82 81 76  Wt. (Lbs) 230.04 226 227.4 231 230.08 236 234  BMI 42.07 kg/m2 41.34 kg/m2 40.28 kg/m2 40.92 kg/m2 42.08 kg/m2 43.16 kg/m2 42.8 kg/m2       MORBID OBESITY  Patient re-educated about  the importance of commitment to a  minimum of 150 minutes of exercise per week as able.  The importance of healthy food choices with portion control discussed, as well as eating regularly and within a 12 hour window most days. The need to choose "clean , green" food 50 to 75% of the time is discussed, as well as to make water the primary drink and set a goal of 64 ounces water daily.       10/08/2022    4:21 PM 06/05/2022    4:14 PM 03/20/2022    4:02 PM  Weight /BMI  Weight 230 lb 0.6 oz 226 lb 227 lb 6.4 oz  Height '5\' 2"'$  (1.575 m) '5\' 2"'$  (1.575 m) '5\' 3"'$  (1.6 m)  BMI 42.07 kg/m2 41.34 kg/m2 40.28 kg/m2      Seasonal allergies Uncontrolled ,start daily loratidine  Prediabetes Patient educated about the importance  of limiting  Carbohydrate intake , the need to commit to daily physical activity for a minimum of 30 minutes , and to commit weight loss. The fact that changes in all these areas will reduce or eliminate all together the development of diabetes is stressed.      Latest Ref Rng & Units 06/06/2022   10:58 AM 12/31/2021    8:34 AM 05/22/2021    9:35 AM 04/06/2021   10:35 AM 04/06/2021   10:00 AM  Diabetic Labs  HbA1c 4.8 - 5.6 % 6.1  6.1  6.1     Chol 100 - 199 mg/dL 138  176  190     HDL >39 mg/dL 40  40  43     Calc LDL 0 - 99 mg/dL 81  120  129     Triglycerides 0 - 149 mg/dL 89  87  96     Creatinine 0.57 - 1.00 mg/dL 1.09  1.13  1.29  1.10  1.10       10/08/2022     4:21 PM 06/05/2022    4:14 PM 03/20/2022    4:02 PM 01/30/2022    8:12 AM 01/04/2022    2:18 PM 08/31/2021    2:12 PM 07/13/2021    2:48 PM  BP/Weight  Systolic BP 485 462 703 500 938 182 993  Diastolic BP 82 82 83 88 82 81 76  Wt. (Lbs) 230.04 226 227.4 231 230.08 236 234  BMI 42.07 kg/m2 41.34 kg/m2 40.28 kg/m2 40.92 kg/m2 42.08 kg/m2 43.16 kg/m2 42.8 kg/m2       No data to display

## 2022-10-12 NOTE — Assessment & Plan Note (Signed)
  Patient re-educated about  the importance of commitment to a  minimum of 150 minutes of exercise per week as able.  The importance of healthy food choices with portion control discussed, as well as eating regularly and within a 12 hour window most days. The need to choose "clean , green" food 50 to 75% of the time is discussed, as well as to make water the primary drink and set a goal of 64 ounces water daily.       10/08/2022    4:21 PM 06/05/2022    4:14 PM 03/20/2022    4:02 PM  Weight /BMI  Weight 230 lb 0.6 oz 226 lb 227 lb 6.4 oz  Height '5\' 2"'$  (1.575 m) '5\' 2"'$  (1.575 m) '5\' 3"'$  (1.6 m)  BMI 42.07 kg/m2 41.34 kg/m2 40.28 kg/m2

## 2022-10-12 NOTE — Assessment & Plan Note (Signed)
Hyperlipidemia:Low fat diet discussed and encouraged.   Lipid Panel  Lab Results  Component Value Date   CHOL 138 06/06/2022   HDL 40 06/06/2022   LDLCALC 81 06/06/2022   TRIG 89 06/06/2022   CHOLHDL 3.5 06/06/2022     Controlled, no change in medication

## 2022-10-12 NOTE — Assessment & Plan Note (Signed)
Patient educated about the importance of limiting  Carbohydrate intake , the need to commit to daily physical activity for a minimum of 30 minutes , and to commit weight loss. The fact that changes in all these areas will reduce or eliminate all together the development of diabetes is stressed.      Latest Ref Rng & Units 06/06/2022   10:58 AM 12/31/2021    8:34 AM 05/22/2021    9:35 AM 04/06/2021   10:35 AM 04/06/2021   10:00 AM  Diabetic Labs  HbA1c 4.8 - 5.6 % 6.1  6.1  6.1     Chol 100 - 199 mg/dL 138  176  190     HDL >39 mg/dL 40  40  43     Calc LDL 0 - 99 mg/dL 81  120  129     Triglycerides 0 - 149 mg/dL 89  87  96     Creatinine 0.57 - 1.00 mg/dL 1.09  1.13  1.29  1.10  1.10       10/08/2022    4:21 PM 06/05/2022    4:14 PM 03/20/2022    4:02 PM 01/30/2022    8:12 AM 01/04/2022    2:18 PM 08/31/2021    2:12 PM 07/13/2021    2:48 PM  BP/Weight  Systolic BP 725 366 440 347 425 956 387  Diastolic BP 82 82 83 88 82 81 76  Wt. (Lbs) 230.04 226 227.4 231 230.08 236 234  BMI 42.07 kg/m2 41.34 kg/m2 40.28 kg/m2 40.92 kg/m2 42.08 kg/m2 43.16 kg/m2 42.8 kg/m2       No data to display

## 2022-10-14 ENCOUNTER — Other Ambulatory Visit: Payer: Self-pay | Admitting: Family Medicine

## 2022-11-19 LAB — LIPID PANEL
Chol/HDL Ratio: 3.3 ratio (ref 0.0–4.4)
Cholesterol, Total: 131 mg/dL (ref 100–199)
HDL: 40 mg/dL (ref >39)
LDL Chol Calc (NIH): 74 mg/dL (ref 0–99)
Triglycerides: 87 mg/dL (ref 0–149)
VLDL Cholesterol Cal: 17 mg/dL (ref 5–40)

## 2022-11-19 LAB — CMP14+EGFR
ALT: 25 IU/L (ref 0–32)
AST: 21 IU/L (ref 0–40)
Albumin/Globulin Ratio: 1.4 (ref 1.2–2.2)
Albumin: 3.9 g/dL (ref 3.9–4.9)
Alkaline Phosphatase: 92 IU/L (ref 44–121)
BUN/Creatinine Ratio: 16 (ref 9–23)
BUN: 18 mg/dL (ref 6–24)
Bilirubin Total: 0.4 mg/dL (ref 0.0–1.2)
CO2: 27 mmol/L (ref 20–29)
Calcium: 9.8 mg/dL (ref 8.7–10.2)
Chloride: 99 mmol/L (ref 96–106)
Creatinine, Ser: 1.1 mg/dL — ABNORMAL HIGH (ref 0.57–1.00)
Globulin, Total: 2.7 g/dL (ref 1.5–4.5)
Glucose: 103 mg/dL — ABNORMAL HIGH (ref 70–99)
Potassium: 3.3 mmol/L — ABNORMAL LOW (ref 3.5–5.2)
Sodium: 139 mmol/L (ref 134–144)
Total Protein: 6.6 g/dL (ref 6.0–8.5)
eGFR: 63 mL/min/{1.73_m2} (ref >59)

## 2022-11-19 LAB — HEMOGLOBIN A1C
Est. average glucose Bld gHb Est-mCnc: 131 mg/dL
Hgb A1c MFr Bld: 6.2 % — ABNORMAL HIGH (ref 4.8–5.6)

## 2022-11-26 ENCOUNTER — Emergency Department (HOSPITAL_COMMUNITY)
Admission: EM | Admit: 2022-11-26 | Discharge: 2022-11-26 | Disposition: A | Discharge status: Home / Self Care | Payer: BC Managed Care – PPO | Attending: Emergency Medicine | Admitting: Emergency Medicine

## 2022-11-26 ENCOUNTER — Other Ambulatory Visit: Payer: Self-pay

## 2022-11-26 ENCOUNTER — Ambulatory Visit: Payer: BC Managed Care – PPO | Admitting: Family Medicine

## 2022-11-26 ENCOUNTER — Emergency Department (HOSPITAL_COMMUNITY): Payer: BC Managed Care – PPO

## 2022-11-26 ENCOUNTER — Encounter: Payer: Self-pay | Admitting: Family Medicine

## 2022-11-26 ENCOUNTER — Ambulatory Visit (INDEPENDENT_AMBULATORY_CARE_PROVIDER_SITE_OTHER): Payer: BC Managed Care – PPO | Admitting: Family Medicine

## 2022-11-26 ENCOUNTER — Encounter (HOSPITAL_COMMUNITY): Payer: Self-pay | Admitting: *Deleted

## 2022-11-26 VITALS — BP 130/82 | HR 102 | Ht 62.0 in | Wt 230.1 lb

## 2022-11-26 DIAGNOSIS — Z79899 Other long term (current) drug therapy: Secondary | ICD-10-CM | POA: Insufficient documentation

## 2022-11-26 DIAGNOSIS — Z1152 Encounter for screening for COVID-19: Secondary | ICD-10-CM | POA: Diagnosis not present

## 2022-11-26 DIAGNOSIS — I10 Essential (primary) hypertension: Secondary | ICD-10-CM | POA: Diagnosis not present

## 2022-11-26 DIAGNOSIS — G4452 New daily persistent headache (NDPH): Secondary | ICD-10-CM | POA: Insufficient documentation

## 2022-11-26 DIAGNOSIS — E876 Hypokalemia: Secondary | ICD-10-CM | POA: Insufficient documentation

## 2022-11-26 DIAGNOSIS — R42 Dizziness and giddiness: Secondary | ICD-10-CM

## 2022-11-26 DIAGNOSIS — R5383 Other fatigue: Secondary | ICD-10-CM

## 2022-11-26 LAB — CBC WITH DIFFERENTIAL/PLATELET
Abs Immature Granulocytes: 0.05 10*3/uL (ref 0.00–0.07)
Basophils Absolute: 0 10*3/uL (ref 0.0–0.1)
Basophils Relative: 0 %
Eosinophils Absolute: 0.1 10*3/uL (ref 0.0–0.5)
Eosinophils Relative: 1 %
HCT: 44.2 % (ref 36.0–46.0)
Hemoglobin: 14.7 g/dL (ref 12.0–15.0)
Immature Granulocytes: 0 %
Lymphocytes Relative: 24 %
Lymphs Abs: 3.1 10*3/uL (ref 0.7–4.0)
MCH: 29.2 pg (ref 26.0–34.0)
MCHC: 33.3 g/dL (ref 30.0–36.0)
MCV: 87.7 fL (ref 80.0–100.0)
Monocytes Absolute: 1 10*3/uL (ref 0.1–1.0)
Monocytes Relative: 8 %
Neutro Abs: 8.8 10*3/uL — ABNORMAL HIGH (ref 1.7–7.7)
Neutrophils Relative %: 67 %
Platelets: 228 10*3/uL (ref 150–400)
RBC: 5.04 MIL/uL (ref 3.87–5.11)
RDW: 13.6 % (ref 11.5–15.5)
WBC: 13.1 10*3/uL — ABNORMAL HIGH (ref 4.0–10.5)
nRBC: 0 % (ref 0.0–0.2)

## 2022-11-26 LAB — RESP PANEL BY RT-PCR (RSV, FLU A&B, COVID)  RVPGX2
Influenza A by PCR: NEGATIVE
Influenza B by PCR: NEGATIVE
Resp Syncytial Virus by PCR: NEGATIVE
SARS Coronavirus 2 by RT PCR: NEGATIVE

## 2022-11-26 LAB — COMPREHENSIVE METABOLIC PANEL
ALT: 31 U/L (ref 0–44)
AST: 25 U/L (ref 15–41)
Albumin: 3.8 g/dL (ref 3.5–5.0)
Alkaline Phosphatase: 88 U/L (ref 38–126)
Anion gap: 12 (ref 5–15)
BUN: 20 mg/dL (ref 6–20)
CO2: 28 mmol/L (ref 22–32)
Calcium: 9.4 mg/dL (ref 8.9–10.3)
Chloride: 97 mmol/L — ABNORMAL LOW (ref 98–111)
Creatinine, Ser: 1.33 mg/dL — ABNORMAL HIGH (ref 0.44–1.00)
GFR, Estimated: 50 mL/min — ABNORMAL LOW (ref >60)
Glucose, Bld: 95 mg/dL (ref 70–99)
Potassium: 2.9 mmol/L — ABNORMAL LOW (ref 3.5–5.1)
Sodium: 137 mmol/L (ref 135–145)
Total Bilirubin: 0.4 mg/dL (ref 0.3–1.2)
Total Protein: 7.6 g/dL (ref 6.5–8.1)

## 2022-11-26 MED ORDER — MECLIZINE HCL 12.5 MG PO TABS
25.0000 mg | ORAL_TABLET | Freq: Once | ORAL | Status: AC
  Administered 2022-11-26: 25 mg via ORAL
  Filled 2022-11-26: qty 2

## 2022-11-26 MED ORDER — ONDANSETRON 4 MG PO TBDP: ORAL_TABLET | ORAL | 0 refills | Status: DC

## 2022-11-26 MED ORDER — POTASSIUM CHLORIDE CRYS ER 20 MEQ PO TBCR
40.0000 meq | EXTENDED_RELEASE_TABLET | Freq: Once | ORAL | Status: AC
  Administered 2022-11-26: 40 meq via ORAL
  Filled 2022-11-26: qty 2

## 2022-11-26 MED ORDER — MECLIZINE HCL 25 MG PO TABS: 25.0000 mg | ORAL_TABLET | Freq: Three times a day (TID) | ORAL | 0 refills | Status: DC | PRN

## 2022-11-26 MED ORDER — ONDANSETRON HCL 4 MG/2ML IJ SOLN
4.0000 mg | Freq: Once | INTRAMUSCULAR | Status: AC
  Administered 2022-11-26: 4 mg via INTRAVENOUS
  Filled 2022-11-26: qty 2

## 2022-11-26 MED ORDER — ALLOPURINOL 300 MG PO TABS: ORAL_TABLET | ORAL | 5 refills | Status: DC

## 2022-11-26 MED ORDER — POTASSIUM CHLORIDE CRYS ER 20 MEQ PO TBCR: 20.0000 meq | EXTENDED_RELEASE_TABLET | Freq: Every day | ORAL | 0 refills | Status: DC

## 2022-11-26 MED ORDER — SODIUM CHLORIDE 0.9 % IV BOLUS
1000.0000 mL | Freq: Once | INTRAVENOUS | Status: AC
  Administered 2022-11-26: 1000 mL via INTRAVENOUS

## 2022-11-26 NOTE — ED Notes (Signed)
Patient transported to MRI 

## 2022-11-26 NOTE — Assessment & Plan Note (Signed)
Acute debilitating fatigue with headache and vertigo, needs ED eval

## 2022-11-26 NOTE — Patient Instructions (Addendum)
F/u for re eval Feb 2, you need to go to the ED for further evaluation and I will call the Physician    Work excuse from Jan 25 to return Feb 5 but you will need to come in for re evaluation this Friday  Thanks for choosing [facility], we consider it a privelige to serve you.

## 2022-11-26 NOTE — Assessment & Plan Note (Signed)
1 week history, unchanged, accompanied by excessive fatigue, and vertigo, needs further eval, work excuse from last day she worked up until in office eval in 3 days Directed to the ED and discussed with Atending

## 2022-11-26 NOTE — ED Triage Notes (Signed)
Pt with HA and dizziness since last week (Thursday).  Seen PCP today and sent here.

## 2022-11-26 NOTE — ED Notes (Signed)
Pt states she feels like the dizziness has mostly resolved.

## 2022-11-26 NOTE — Progress Notes (Signed)
   [name]     MRN: [id number]      DOB: 1976/03/11   HPI [name] is here stating that she became acutely ill on 01/23 , experiencincing significant, faTIGUE, NO ENERGY, , WORKED ON WEDNESDAY, , DID NOT GO TO WORK ON THURSDAY AND FRIDAY, DEVELOPED POSTERIOR HEADACHE RATED AT A 5 CURRENTLY, HAS BEEN UP TO A 6, HAS TAKEN NO MEDICATION, SLEEP NOT DISTURBED, EXPERIENCED A S SHARP PAIN, , ALSO NEW VERTIGO WHICH STARTED 01/26 AND IS UNCHANGED, PLACE IS SPINNING, HAD MILD NAUSEA ON THURRASDAY AND FRIDAY NO VOMITING,NO LOOSE STOOL. Symptoms persist and are not improving. No other new neurologic symptoms noted, No other family members are ill, home covid test is negative. ROS Denies recent fever or chills. Denies sinus pressure, nasal congestion, ear pain or sore throat. Denies chest congestion, productive cough or wheezing. Denies chest pains, palpitations and leg swelling Denies abdominal pain,  vomiting,diarrhea or constipation.   Denies dysuria, frequency, hesitancy or incontinence. Denies joint pain, swelling and limitation in mobility. . Denies depression, anxiety or insomnia. Denies skin break down or rash.   PE  BP 115/82 (BP Location: Right Arm, Patient Position: Sitting, Cuff Size: Large)   Pulse (!) 102   Ht '5\' 2"'$  (1.575 m)   Wt 230 lb 1.3 oz (104.4 kg)   SpO2 98%   BMI 42.08 kg/m   Patient alert and oriented and in no cardiopulmonary distress.Ill appearing , looks exhausted  HEENT: No facial asymmetry, EOMI,     Neck supple .  Chest: Clear to auscultation bilaterally.  CVS: S1, S2 no murmurs, no S3.Regular rate.  ABD: Soft non tender.   Ext: No edema  MS: Adequate ROM spine, shoulders, hips and knees.  Skin: Intact, no ulcerations or rash noted.  Psych: Good eye contact, normal affect. Memory intact not anxious or depressed appearing.  CNS: CN 2-12 intact, power,  normal throughout.no focal deficits noted.   Assessment & Plan  New daily  persistent headache 1 week history, unchanged, accompanied by excessive fatigue, and vertigo, needs further eval, work excuse from last day she worked up until in office eval in 3 days Directed to the ED and discussed with Atending  Fatigue Acute debilitating fatigue with headache and vertigo, needs ED eval  Vertigo Acute onset and perisiting, refer to ED for eval and appropriate management

## 2022-11-26 NOTE — ED Provider Notes (Signed)
[facility] Provider Note   CSN: [id number] Arrival date & time: 11/26/22  1616     History {Add pertinent medical, surgical, social history, OB history to HPI:1} Chief Complaint  Patient presents with   Dizziness    [name] is a 47 y.o. female.  Patient has a history of hypertension.  She complains of dizziness for few days.  She states feel like the room is moving around   Dizziness      Home Medications Prior to Admission medications   Medication Sig Start Date End Date Taking? Authorizing Provider  meclizine (ANTIVERT) 25 MG tablet Take 1 tablet (25 mg total) by mouth 3 (three) times daily as needed for dizziness. 11/26/22  Yes Milton Ferguson, MD  ondansetron (ZOFRAN-ODT) 4 MG disintegrating tablet '4mg'$  ODT q4 hours prn nausea/vomit 11/26/22  Yes Milton Ferguson, MD  potassium chloride SA (KLOR-CON M) 20 MEQ tablet Take 1 tablet (20 mEq total) by mouth daily. 11/26/22  Yes Milton Ferguson, MD  allopurinol (ZYLOPRIM) 300 MG tablet TAKE 1 TABLET(300 MG) BY MOUTH DAILY 11/26/22   Fayrene Helper, MD  amLODipine (NORVASC) 2.5 MG tablet Take 1 tablet (2.5 mg total) by mouth daily. 10/08/22   Fayrene Helper, MD  amLODipine (NORVASC) 5 MG tablet TAKE 1 TABLET(5 MG) BY MOUTH DAILY 09/30/22   Fayrene Helper, MD  atorvastatin (LIPITOR) 40 MG tablet TAKE 1 TABLET(40 MG) BY MOUTH DAILY 06/14/22   Fayrene Helper, MD  azelastine (ASTELIN) 0.1 % nasal spray Place 2 sprays into both nostrils 2 (two) times daily. Use in each nostril as directed 06/05/22   Fayrene Helper, MD  fluticasone [facility]) 50 MCG/ACT nasal spray Place 1 spray into both nostrils 2 (two) times daily. 10/10/22   Fayrene Helper, MD  loratadine (CLARITIN) 10 MG tablet Take 1 tablet (10 mg total) by mouth daily. 10/08/22   Fayrene Helper, MD  Multiple Vitamin (MULTIVITAMIN WITH MINERALS) TABS tablet Take 1 tablet by mouth every evening.     [provider]  rosuvastatin (CRESTOR) 40 MG tablet Take 1 tablet (40 mg total) by mouth daily. 01/04/22   Fayrene Helper, MD  triamterene-hydrochlorothiazide (MAXZIDE) 75-50 MG tablet TAKE 1 TABLET BY MOUTH DAILY 10/14/22   Fayrene Helper, MD      Allergies    Hydrocodone-acetaminophen and Hydrocodone-acetaminophen    Review of Systems   Review of Systems  Neurological:  Positive for dizziness.    Physical Exam Updated Vital Signs BP (!) 146/86   Pulse 76   Temp 98.6 F (37 C) (Oral)   Resp (!) 23   Ht 5' 2.5" (1.588 m)   Wt 104.3 kg   SpO2 100%   BMI 41.40 kg/m  Physical Exam  ED Results / Procedures / Treatments   Labs (all labs ordered are listed, but only abnormal results are displayed) Labs Reviewed  CBC WITH DIFFERENTIAL/PLATELET - Abnormal; Notable for the following components:      Result Value   WBC 13.1 (*)    Neutro Abs 8.8 (*)    All other components within normal limits  COMPREHENSIVE METABOLIC PANEL - Abnormal; Notable for the following components:   Potassium 2.9 (*)    Chloride 97 (*)    Creatinine, Ser 1.33 (*)    GFR, Estimated 50 (*)    All other components within normal limits  RESP PANEL BY RT-PCR (RSV, FLU A&B, COVID)  RVPGX2  EKG None  Radiology MR BRAIN WO CONTRAST  Result Date: 11/26/2022 CLINICAL DATA:  Stroke suspected, dizziness EXAM: MRI HEAD WITHOUT CONTRAST TECHNIQUE: Multiplanar, multiecho pulse sequences of the brain and surrounding structures were obtained without intravenous contrast. COMPARISON:  None Available. FINDINGS: Brain: No restricted diffusion to suggest acute or subacute infarct. No acute hemorrhage, mass, mass effect, or midline shift. No hydrocephalus or extra-axial collection. Partial empty sella. Normal craniocervical junction. No hemosiderin deposition to suggest remote hemorrhage. Vascular: Patent arterial flow voids. Skull and upper cervical spine: Normal marrow signal. Sinuses/Orbits: Clear  paranasal sinuses. No acute finding in the orbits. Other: The mastoid air cells are well aerated. IMPRESSION: No acute intracranial process. No evidence of acute or subacute infarct. Electronically Signed   By: Merilyn Baba M.D.   On: 11/26/2022 18:36    Procedures Procedures  {Document cardiac monitor, telemetry assessment procedure when appropriate:1}  Medications Ordered in ED Medications  sodium chloride 0.9 % bolus 1,000 mL (1,000 mLs Intravenous New Bag/Given 11/26/22 1725)  ondansetron (ZOFRAN) injection 4 mg (4 mg Intravenous Given 11/26/22 1727)  meclizine (ANTIVERT) tablet 25 mg (25 mg Oral Given 11/26/22 1727)  potassium chloride SA (KLOR-CON M) CR tablet 40 mEq (40 mEq Oral Given 11/26/22 1938)    ED Course/ Medical Decision Making/ A&P   {   Click here for ABCD2, HEART and other calculatorsREFRESH Note before signing :1}                          Medical Decision Making Amount and/or Complexity of Data Reviewed Labs: ordered. Radiology: ordered.  Risk Prescription drug management.   Patient with vertigo symptoms and hypokalemia.  She improved with Antivert and will be discharged home on Antivert and potassium  {Document critical care time when appropriate:1} {Document review of labs and clinical decision tools ie heart score, Chads2Vasc2 etc:1}  {Document your independent review of radiology images, and any outside records:1} {Document your discussion with family members, caretakers, and with consultants:1} {Document social determinants of health affecting pt's care:1} {Document your decision making why or why not admission, treatments were needed:1} Final Clinical Impression(s) / ED Diagnoses Final diagnoses:  Vertigo  Hypokalemia    Rx / DC Orders ED Discharge Orders          Ordered    potassium chloride SA (KLOR-CON M) 20 MEQ tablet  Daily        11/26/22 2009    meclizine (ANTIVERT) 25 MG tablet  3 times daily PRN        11/26/22 2009    ondansetron  (ZOFRAN-ODT) 4 MG disintegrating tablet        11/26/22 2009

## 2022-11-26 NOTE — Discharge Instructions (Signed)
Drink plenty of fluids.  Follow-up with your doctor either the end of this week or beginning and next week

## 2022-11-26 NOTE — Assessment & Plan Note (Signed)
Acute onset and perisiting, refer to ED for eval and appropriate management

## 2022-11-27 ENCOUNTER — Telehealth: Payer: Self-pay

## 2022-11-27 NOTE — Telephone Encounter (Signed)
Transition Care Management Unsuccessful Follow-up Telephone Call  Date of discharge and from where:  [facility] 11/26/2022  Attempts:  1st Attempt  Reason for unsuccessful TCM follow-up call:  Left voice message Juanda Crumble, [place] Direct Dial [phone]

## 2022-11-28 ENCOUNTER — Telehealth: Payer: Self-pay | Admitting: Family Medicine

## 2022-11-28 NOTE — Telephone Encounter (Signed)
Transition Care Management Unsuccessful Follow-up Telephone Call  Date of discharge and from where:  [facility] 11/26/2022  Attempts:  2nd Attempt  Reason for unsuccessful TCM follow-up call:  Left voice message Juanda Crumble, [place] Direct Dial [phone]

## 2022-11-28 NOTE — Telephone Encounter (Signed)
Please call patient about going back to work Monday.  She was scheduled to see Dr Chauncey Cruel tomorrow and has a note to go back Monday but Dr Chauncey Cruel said she wanted to see her prior to going back to work.  Please call patient today.

## 2022-11-29 ENCOUNTER — Other Ambulatory Visit: Payer: Self-pay

## 2022-11-29 ENCOUNTER — Ambulatory Visit: Payer: BC Managed Care – PPO | Admitting: Family Medicine

## 2022-11-29 DIAGNOSIS — E876 Hypokalemia: Secondary | ICD-10-CM

## 2022-11-29 MED ORDER — POTASSIUM CHLORIDE CRYS ER 20 MEQ PO TBCR: 20.0000 meq | EXTENDED_RELEASE_TABLET | Freq: Two times a day (BID) | ORAL | 0 refills | Status: DC

## 2022-11-29 NOTE — Telephone Encounter (Signed)
Patient aware.

## 2022-12-03 LAB — CBC WITH DIFFERENTIAL/PLATELET
Basophils Absolute: 0 10*3/uL (ref 0.0–0.2)
Basos: 0 %
EOS (ABSOLUTE): 0.1 10*3/uL (ref 0.0–0.4)
Eos: 1 %
Hematocrit: 41.9 % (ref 34.0–46.6)
Hemoglobin: 14.2 g/dL (ref 11.1–15.9)
Immature Grans (Abs): 0 10*3/uL (ref 0.0–0.1)
Immature Granulocytes: 0 %
Lymphocytes Absolute: 3 10*3/uL (ref 0.7–3.1)
Lymphs: 32 %
MCH: 29.2 pg (ref 26.6–33.0)
MCHC: 33.9 g/dL (ref 31.5–35.7)
MCV: 86 fL (ref 79–97)
Monocytes Absolute: 0.6 10*3/uL (ref 0.1–0.9)
Monocytes: 6 %
Neutrophils Absolute: 5.6 10*3/uL (ref 1.4–7.0)
Neutrophils: 61 %
Platelets: 189 10*3/uL (ref 150–450)
RBC: 4.87 x10E6/uL (ref 3.77–5.28)
RDW: 13.6 % (ref 11.7–15.4)
WBC: 9.3 10*3/uL (ref 3.4–10.8)

## 2022-12-03 LAB — BMP8+EGFR
BUN/Creatinine Ratio: 15 (ref 9–23)
BUN: 17 mg/dL (ref 6–24)
CO2: 24 mmol/L (ref 20–29)
Calcium: 9.8 mg/dL (ref 8.7–10.2)
Chloride: 100 mmol/L (ref 96–106)
Creatinine, Ser: 1.15 mg/dL — ABNORMAL HIGH (ref 0.57–1.00)
Glucose: 99 mg/dL (ref 70–99)
Potassium: 3.6 mmol/L (ref 3.5–5.2)
Sodium: 140 mmol/L (ref 134–144)
eGFR: 59 mL/min/{1.73_m2} — ABNORMAL LOW (ref >59)

## 2022-12-04 ENCOUNTER — Encounter: Payer: Self-pay | Admitting: Family Medicine

## 2022-12-04 ENCOUNTER — Ambulatory Visit (INDEPENDENT_AMBULATORY_CARE_PROVIDER_SITE_OTHER): Payer: BC Managed Care – PPO | Admitting: Family Medicine

## 2022-12-04 VITALS — BP 120/72 | HR 86 | Ht 62.0 in | Wt 231.0 lb

## 2022-12-04 DIAGNOSIS — R5383 Other fatigue: Secondary | ICD-10-CM | POA: Diagnosis not present

## 2022-12-04 DIAGNOSIS — R7303 Prediabetes: Secondary | ICD-10-CM

## 2022-12-04 DIAGNOSIS — I10 Essential (primary) hypertension: Secondary | ICD-10-CM

## 2022-12-04 DIAGNOSIS — E876 Hypokalemia: Secondary | ICD-10-CM | POA: Diagnosis not present

## 2022-12-04 DIAGNOSIS — R42 Dizziness and giddiness: Secondary | ICD-10-CM

## 2022-12-04 DIAGNOSIS — Z09 Encounter for follow-up examination after completed treatment for conditions other than malignant neoplasm: Secondary | ICD-10-CM

## 2022-12-04 DIAGNOSIS — G4452 New daily persistent headache (NDPH): Secondary | ICD-10-CM

## 2022-12-04 DIAGNOSIS — E7800 Pure hypercholesterolemia, unspecified: Secondary | ICD-10-CM

## 2022-12-04 MED ORDER — POTASSIUM CHLORIDE CRYS ER 20 MEQ PO TBCR: 20.0000 meq | EXTENDED_RELEASE_TABLET | Freq: Two times a day (BID) | ORAL | 3 refills | Status: DC

## 2022-12-04 NOTE — Patient Instructions (Addendum)
Follow-up first week in May, call if you need me sooner.  Continue medications as prescribed.  Non fasting chem 7 and EGFR week of March 11 to re evaluate potassium  Pls give work excuse from 1/30 to return 12/05/2022, no restrictions  Thankful that you are 80% improved return to work in the morning, please send message if for any reason you start feeling ill again.  Nonfasting HbA1c and Chem-7 to be drawn 3 to 5 days before your next appointment.  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

## 2022-12-04 NOTE — Progress Notes (Signed)
[name]     MRN: [id number]      DOB: 05/12/1976   HPI [name] is here for follow up of recent ED visit on 11/26/2022, when she presented with acut exhaustion , only abnormality on imaging brain and labs was hypokalemia and leftshift in WBC with leukocytosis States she has gradually improved back to 80 % baseline , however feels that generally she pushes herself too much both at work and in the community   ROS Denies recent fever or chills. Denies sinus pressure, nasal congestion, ear pain or sore throat. Denies chest congestion, productive cough or wheezing. Denies chest pains, palpitations and leg swelling Denies abdominal pain, nausea, vomiting,diarrhea or constipation.   Denies dysuria, frequency, hesitancy or incontinence. Denies joint pain, swelling and limitation in mobility. Denies headaches, seizures, numbness, or tingling. Denies depression, anxiety or insomnia. Denies skin break down or rash.   PE  BP 120/72   Pulse 86   Ht 5' 2"$  (1.575 m)   Wt 231 lb (104.8 kg)   SpO2 96%   BMI 42.25 kg/m   Patient alert and oriented and in no cardiopulmonary distress.  HEENT: No facial asymmetry, EOMI,     Neck supple .  Chest: Clear to auscultation bilaterally.  CVS: S1, S2 no murmurs, no S3.Regular rate.  ABD: Soft non tender.   Ext: No edema  MS: Adequate ROM spine, shoulders, hips and knees.  Skin: Intact, no ulcerations or rash noted.  Psych: Good eye contact, normal affect. Memory intact not anxious or depressed appearing.  CNS: CN 2-12 intact, power,  normal throughout.no focal deficits noted.   Assessment & Plan  Vertigo resolved  New daily persistent headache Resolved and brain scan is normal, no neurologic deficits and y history  Prediabetes Patient educated about the importance of limiting  Carbohydrate intake , the need to commit to daily physical activity for a minimum of 30 minutes , and to commit weight loss. The fact that  changes in all these areas will reduce or eliminate all together the development of diabetes is stressed.      Latest Ref Rng & Units 12/02/2022    8:25 AM 11/26/2022    5:30 PM 11/18/2022    8:34 AM 06/06/2022   10:58 AM 12/31/2021    8:34 AM  Diabetic Labs  HbA1c 4.8 - 5.6 %   6.2  6.1  6.1   Chol 100 - 199 mg/dL   131  138  176   HDL >39 mg/dL   40  40  40   Calc LDL 0 - 99 mg/dL   74  81  120   Triglycerides 0 - 149 mg/dL   87  89  87   Creatinine 0.57 - 1.00 mg/dL 1.15  1.33  1.10  1.09  1.13       12/04/2022    2:48 PM 12/04/2022    2:23 PM 12/04/2022    2:22 PM 11/26/2022    5:30 PM 11/26/2022    5:04 PM 11/26/2022    5:00 PM 11/26/2022    3:53 PM  BP/Weight  Systolic BP 123456 [id number] 123456 [id number]  [id number]  Diastolic BP 72 78 44 86 72  82  Wt. (Lbs)   231   230   BMI   42.25 kg/m2   41.4 kg/m2        No data to display          Updated lab needed at/ before next  visit.   [place]  Patient re-educated about  the importance of commitment to a  minimum of 150 minutes of exercise per week as able.  The importance of healthy food choices with portion control discussed, as well as eating regularly and within a 12 hour window most days. The need to choose "clean , green" food 50 to 75% of the time is discussed, as well as to make water the primary drink and set a goal of 64 ounces water daily.       12/04/2022    2:22 PM 11/26/2022    5:00 PM 11/26/2022    3:12 PM  Weight /BMI  Weight 231 lb 230 lb 230 lb 1.3 oz  Height 5' 2"$  (1.575 m) 5' 2.5" (1.588 m) 5' 2"$  (1.575 m)  BMI 42.25 kg/m2 41.4 kg/m2 42.08 kg/m2      Fatigue Improved , back 80 %  of baseline  Hyperlipemia Hyperlipidemia:Low fat diet discussed and encouraged.   Lipid Panel  Lab Results  Component Value Date   CHOL 131 11/18/2022   HDL 40 11/18/2022   LDLCALC 74 11/18/2022   TRIG 87 11/18/2022   CHOLHDL 3.3 11/18/2022       Essential hypertension Controlled, no change in medication DASH diet and  commitment to daily physical activity for a minimum of 30 minutes discussed and encouraged, as a part of hypertension management. The importance of attaining a healthy weight is also discussed.     12/04/2022    2:48 PM 12/04/2022    2:23 PM 12/04/2022    2:22 PM 11/26/2022    5:30 PM 11/26/2022    5:04 PM 11/26/2022    5:00 PM 11/26/2022    3:53 PM  BP/Weight  Systolic BP [id number] 123456 [id number]  [id number]  Diastolic BP 72 78 44 86 72  82  Wt. (Lbs)   231   230   BMI   42.25 kg/m2   41.4 kg/m2        Hypokalemia Re eval potassium level in 3 weeks  Encounter for examination following treatment at hospital Patient in for follow up of recent ED visit.Marland Kitchen Discharge summary, and laboratory and radiology data are reviewed, and any questions or concerns  are discussed. Specific issues requiring follow up are specifically addressed.

## 2022-12-06 ENCOUNTER — Ambulatory Visit: Payer: BC Managed Care – PPO | Admitting: Family Medicine

## 2022-12-08 ENCOUNTER — Encounter: Payer: Self-pay | Admitting: Family Medicine

## 2022-12-08 DIAGNOSIS — E876 Hypokalemia: Secondary | ICD-10-CM | POA: Insufficient documentation

## 2022-12-08 DIAGNOSIS — Z09 Encounter for follow-up examination after completed treatment for conditions other than malignant neoplasm: Secondary | ICD-10-CM | POA: Insufficient documentation

## 2022-12-08 NOTE — Assessment & Plan Note (Signed)
Resolved and brain scan is normal, no neurologic deficits and y history

## 2022-12-08 NOTE — Assessment & Plan Note (Signed)
  Patient re-educated about  the importance of commitment to a  minimum of 150 minutes of exercise per week as able.  The importance of healthy food choices with portion control discussed, as well as eating regularly and within a 12 hour window most days. The need to choose "clean , green" food 50 to 75% of the time is discussed, as well as to make water the primary drink and set a goal of 64 ounces water daily.       12/04/2022    2:22 PM 11/26/2022    5:00 PM 11/26/2022    3:12 PM  Weight /BMI  Weight 231 lb 230 lb 230 lb 1.3 oz  Height '5\' 2"'$  (1.575 m) 5' 2.5" (1.588 m) '5\' 2"'$  (1.575 m)  BMI 42.25 kg/m2 41.4 kg/m2 42.08 kg/m2

## 2022-12-08 NOTE — Assessment & Plan Note (Signed)
Improved , back 80 %  of baseline

## 2022-12-08 NOTE — Assessment & Plan Note (Signed)
Patient in for follow up of recent ED visit.Marland Kitchen Discharge summary, and laboratory and radiology data are reviewed, and any questions or concerns  are discussed. Specific issues requiring follow up are specifically addressed.

## 2022-12-08 NOTE — Assessment & Plan Note (Addendum)
Controlled, no change in medication DASH diet and commitment to daily physical activity for a minimum of 30 minutes discussed and encouraged, as a part of hypertension management. The importance of attaining a healthy weight is also discussed.     12/04/2022    2:48 PM 12/04/2022    2:23 PM 12/04/2022    2:22 PM 11/26/2022    5:30 PM 11/26/2022    5:04 PM 11/26/2022    5:00 PM 11/26/2022    3:53 PM  BP/Weight  Systolic BP [id number] 123456 [id number]  [id number]  Diastolic BP 72 78 44 86 72  82  Wt. (Lbs)   231   230   BMI   42.25 kg/m2   41.4 kg/m2

## 2022-12-08 NOTE — Assessment & Plan Note (Signed)
Re eval potassium level in 3 weeks

## 2022-12-08 NOTE — Assessment & Plan Note (Signed)
Hyperlipidemia:Low fat diet discussed and encouraged.   Lipid Panel  Lab Results  Component Value Date   CHOL 131 11/18/2022   HDL 40 11/18/2022   LDLCALC 74 11/18/2022   TRIG 87 11/18/2022   CHOLHDL 3.3 11/18/2022

## 2022-12-08 NOTE — Assessment & Plan Note (Signed)
resolved 

## 2022-12-08 NOTE — Assessment & Plan Note (Signed)
Patient educated about the importance of limiting  Carbohydrate intake , the need to commit to daily physical activity for a minimum of 30 minutes , and to commit weight loss. The fact that changes in all these areas will reduce or eliminate all together the development of diabetes is stressed.      Latest Ref Rng & Units 12/02/2022    8:25 AM 11/26/2022    5:30 PM 11/18/2022    8:34 AM 06/06/2022   10:58 AM 12/31/2021    8:34 AM  Diabetic Labs  HbA1c 4.8 - 5.6 %   6.2  6.1  6.1   Chol 100 - 199 mg/dL   131  138  176   HDL >39 mg/dL   40  40  40   Calc LDL 0 - 99 mg/dL   74  81  120   Triglycerides 0 - 149 mg/dL   87  89  87   Creatinine 0.57 - 1.00 mg/dL 1.15  1.33  1.10  1.09  1.13       12/04/2022    2:48 PM 12/04/2022    2:23 PM 12/04/2022    2:22 PM 11/26/2022    5:30 PM 11/26/2022    5:04 PM 11/26/2022    5:00 PM 11/26/2022    3:53 PM  BP/Weight  Systolic BP [id number] 123456 [id number]  [id number]  Diastolic BP 72 78 44 86 72  82  Wt. (Lbs)   231   230   BMI   42.25 kg/m2   41.4 kg/m2        No data to display          Updated lab needed at/ before next visit.

## 2022-12-11 DIAGNOSIS — Z0279 Encounter for issue of other medical certificate: Secondary | ICD-10-CM

## 2022-12-12 ENCOUNTER — Telehealth: Payer: Self-pay | Admitting: Family Medicine

## 2022-12-12 NOTE — Telephone Encounter (Signed)
FMLA   Called left patient voicemail to pick up forms

## 2022-12-13 NOTE — Telephone Encounter (Signed)
Forms picked up and paid.

## 2022-12-25 ENCOUNTER — Telehealth: Payer: Self-pay | Admitting: Family Medicine

## 2022-12-25 NOTE — Telephone Encounter (Signed)
LVM pw has been signed and ready for pick up

## 2022-12-26 ENCOUNTER — Encounter: Payer: Self-pay | Admitting: Radiology

## 2023-01-09 ENCOUNTER — Encounter: Payer: Self-pay | Admitting: Cardiology

## 2023-01-09 ENCOUNTER — Ambulatory Visit: Payer: BC Managed Care – PPO | Attending: Cardiology | Admitting: Cardiology

## 2023-01-09 VITALS — BP 120/74 | HR 69 | Ht 63.0 in | Wt 230.8 lb

## 2023-01-09 DIAGNOSIS — R002 Palpitations: Secondary | ICD-10-CM | POA: Diagnosis not present

## 2023-01-09 DIAGNOSIS — R7303 Prediabetes: Secondary | ICD-10-CM

## 2023-01-09 DIAGNOSIS — I10 Essential (primary) hypertension: Secondary | ICD-10-CM | POA: Diagnosis not present

## 2023-01-09 DIAGNOSIS — E782 Mixed hyperlipidemia: Secondary | ICD-10-CM | POA: Diagnosis not present

## 2023-01-09 DIAGNOSIS — E876 Hypokalemia: Secondary | ICD-10-CM

## 2023-01-09 NOTE — Progress Notes (Signed)
Cardiology Office Note:   Date:  01/09/2023  ID:  [name], DOB December 08, 1975, MRN [id number]  History of Present Illness:   [name] is a 47 y.o. female with a past medical history atypical chest pain, intermittent palpitations, primary hypertension, mixed hyperlipidemia, history of syncope, vertigo, and prediabetes who is here today to follow-up on her intermittent palpitations and hypertension.  She was last seen in clinic 07/10/2021 by Dr. Johnsie Cancel and at that time she had been have been chest discomfort and palpitations.  Her chest discomfort was atypical but she had also been under an extreme amount of stress with the loss of her mother and then her brother.  Upon further discussion the patient agreed to undergo coronary CTA if her symptoms returned.  Her EKG was nonischemic.  There were no changes made to her medication and she was recommended to follow-up in a year.  She was evaluated in the emergency department at [facility] on 11/26/2022 with a chief complaint of dizziness.  She had had dizziness for several days and felt like the room was spinning.  Ultimately she was diagnosed with hypokalemia with potassium of 2.9 and vertigo.  Both were treated she was started on potassium supplements as well as meclizine and Zofran.  She was considered stable for discharge and discharged home.  She returns to clinic today stating that she has been doing well.  She has had no further episodes of dizziness and vertigo.  She denies any chest pain, shortness of breath, dyspnea on exertion and peripheral edema.  She continues to follow with her PCP and was just evaluated on 12/04/2022.  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   ROS: Endorses occasional fatigue but states is returning to baseline.  Studies Reviewed:    EKG: EKG completed 11/27/2022 in the emergency  department revealed sinus rhythm with a rate of 92  TTE 10/14/2014 Study Conclusions   - Left ventricle: The cavity size was normal. Wall thickness was    normal. Systolic function was normal. The estimated ejection    fraction was in the range of 55% to 60%. Wall motion was normal;    there were no regional wall motion abnormalities. Left    ventricular diastolic function parameters were normal.  - Mitral valve: There was mild regurgitation.    Risk Assessment/Calculations:        Lipid Panel     Component Value Date/Time   CHOL 131 11/18/2022 0834   TRIG 87 11/18/2022 0834   HDL 40 11/18/2022 0834   CHOLHDL 3.3 11/18/2022 0834   CHOLHDL 3.9 11/26/2019 0833   VLDL 12 11/11/2016 0843   LDLCALC 74 11/18/2022 0834   LDLCALC 99 11/26/2019 0833   LABVLDL 17 11/18/2022 0834         Physical Exam:   VS:  BP 120/74   Pulse 69   Ht '5\' 3"'$  (1.6 m)   Wt 230 lb 12.8 oz (104.7 kg)   SpO2 98%   BMI 40.88 kg/m    Wt Readings from Last 3 Encounters:  01/09/23 230 lb 12.8 oz (104.7 kg)  12/04/22 231 lb (104.8 kg)  11/26/22 230 lb (104.3 kg)     GEN: Well nourished, well developed in no acute distress NECK: No JVD; No carotid bruits CARDIAC: RRR, no murmurs, rubs, gallops RESPIRATORY:  Clear to auscultation without  rales, wheezing or rhonchi  ABDOMEN: Soft, non-tender, non-distended EXTREMITIES:  No edema; No deformity   ASSESSMENT AND PLAN:   Hypertension with blood pressure today of 120/74.  She continues on amlodipine 7.5 mg daily as well as triamterene HCTZ 75/50 mg daily.  Blood pressure has been stable.  She is continued on low-sodium diet advised on fast food prepackaged and preprocessed foods.  There were no changes made to her medication regimen today.  Hyperlipidemia with LDL of 74 11/18/2022.  She continues to be on atorvastatin 40 mg daily.  This continues to be monitored by her PCP.  History of palpitations that has resolved.  Was thought to be likely  stress-induced.  If she were to continue to suffer from palpitations in the future we can consider ZIO XT monitor.  Hypokalemia found on her emergency department visit.  She was started on potassium supplementations for serum potassium of 2.9.  She states that she has some blood work coming up with her PCP to reevaluate her serum potassium.  Pre-Diabetes with a last hemoglobin A1c of 6.2.  She is now regulating her A1c with diet and activity.  This continues to be monitored by her PCP.  Disposition patient return to clinic to see MD/APP in 1 year or sooner if needed.        Signed, Delmus Warwick, NP

## 2023-01-09 NOTE — Patient Instructions (Signed)
Medication Instructions:  Your physician recommends that you continue on your current medications as directed. Please refer to the Current Medication list given to you today.  *If you need a refill on your cardiac medications before your next appointment, please call your pharmacy*   Lab Work: NONE   If you have labs (blood work) drawn today and your tests are completely normal, you will receive your results only by: MyChart Message (if you have MyChart) OR A paper copy in the mail If you have any lab test that is abnormal or we need to change your treatment, we will call you to review the results.   Testing/Procedures: NONE    Follow-Up: At [facility], you and your health needs are our priority.  As part of our continuing mission to provide you with exceptional heart care, we have created designated Provider Care Teams.  These Care Teams include your primary Cardiologist (physician) and Advanced Practice Providers (APPs -  Physician Assistants and Nurse Practitioners) who all work together to provide you with the care you need, when you need it.  We recommend signing up for the patient portal called "MyChart".  Sign up information is provided on this After Visit Summary.  MyChart is used to connect with patients for Virtual Visits (Telemedicine).  Patients are able to view lab/test results, encounter notes, upcoming appointments, etc.  Non-urgent messages can be sent to your provider as well.   To learn more about what you can do with MyChart, go to [email or web address].    Your next appointment:   1 year(s)  Provider:   You may see Peter Nishan, MD or one of the following Advanced Practice Providers on your designated Care Team:   Brittany Strader, PA-C  Michele Lenze, PA-C     Other Instructions Thank you for choosing [facility]!    

## 2023-01-15 LAB — HM PAP SMEAR

## 2023-01-20 ENCOUNTER — Other Ambulatory Visit: Payer: Self-pay | Admitting: Family Medicine

## 2023-01-20 MED ORDER — AMLODIPINE BESYLATE 2.5 MG PO TABS: 2.5000 mg | ORAL_TABLET | Freq: Every day | ORAL | 0 refills | Status: DC

## 2023-01-21 LAB — BMP8+EGFR
BUN/Creatinine Ratio: 13 (ref 9–23)
BUN: 13 mg/dL (ref 6–24)
CO2: 26 mmol/L (ref 20–29)
Calcium: 9.7 mg/dL (ref 8.7–10.2)
Chloride: 103 mmol/L (ref 96–106)
Creatinine, Ser: 1.03 mg/dL — ABNORMAL HIGH (ref 0.57–1.00)
Glucose: 82 mg/dL (ref 70–99)
Potassium: 3.7 mmol/L (ref 3.5–5.2)
Sodium: 143 mmol/L (ref 134–144)
eGFR: 68 mL/min/{1.73_m2} (ref >59)

## 2023-01-22 ENCOUNTER — Other Ambulatory Visit: Payer: Self-pay | Admitting: Family Medicine

## 2023-02-06 ENCOUNTER — Other Ambulatory Visit: Payer: Self-pay | Admitting: Family Medicine

## 2023-03-05 ENCOUNTER — Ambulatory Visit: Payer: BC Managed Care – PPO | Admitting: Family Medicine

## 2023-03-18 LAB — BMP8+EGFR
BUN/Creatinine Ratio: 12 (ref 9–23)
BUN: 13 mg/dL (ref 6–24)
CO2: 27 mmol/L (ref 20–29)
Calcium: 9.4 mg/dL (ref 8.7–10.2)
Chloride: 103 mmol/L (ref 96–106)
Creatinine, Ser: 1.07 mg/dL — ABNORMAL HIGH (ref 0.57–1.00)
Glucose: 93 mg/dL (ref 70–99)
Potassium: 3.6 mmol/L (ref 3.5–5.2)
Sodium: 140 mmol/L (ref 134–144)
eGFR: 65 mL/min/{1.73_m2} (ref >59)

## 2023-03-18 LAB — HEMOGLOBIN A1C
Est. average glucose Bld gHb Est-mCnc: 131 mg/dL
Hgb A1c MFr Bld: 6.2 % — ABNORMAL HIGH (ref 4.8–5.6)

## 2023-03-19 ENCOUNTER — Encounter: Payer: Self-pay | Admitting: Family Medicine

## 2023-03-19 ENCOUNTER — Ambulatory Visit (INDEPENDENT_AMBULATORY_CARE_PROVIDER_SITE_OTHER): Payer: BC Managed Care – PPO | Admitting: Family Medicine

## 2023-03-19 VITALS — BP 120/84 | HR 87 | Ht 63.0 in | Wt 234.0 lb

## 2023-03-19 DIAGNOSIS — R7303 Prediabetes: Secondary | ICD-10-CM | POA: Diagnosis not present

## 2023-03-19 DIAGNOSIS — E876 Hypokalemia: Secondary | ICD-10-CM

## 2023-03-19 DIAGNOSIS — R7301 Impaired fasting glucose: Secondary | ICD-10-CM

## 2023-03-19 DIAGNOSIS — I10 Essential (primary) hypertension: Secondary | ICD-10-CM

## 2023-03-19 DIAGNOSIS — E559 Vitamin D deficiency, unspecified: Secondary | ICD-10-CM

## 2023-03-19 DIAGNOSIS — E790 Hyperuricemia without signs of inflammatory arthritis and tophaceous disease: Secondary | ICD-10-CM

## 2023-03-19 DIAGNOSIS — Z13220 Encounter for screening for lipoid disorders: Secondary | ICD-10-CM

## 2023-03-19 DIAGNOSIS — E7800 Pure hypercholesterolemia, unspecified: Secondary | ICD-10-CM | POA: Diagnosis not present

## 2023-03-19 MED ORDER — BENZONATATE 100 MG PO CAPS: 100.0000 mg | ORAL_CAPSULE | Freq: Two times a day (BID) | ORAL | 0 refills | Status: DC | PRN

## 2023-03-19 NOTE — Progress Notes (Signed)
TARRIA ASKEY     MRN: [id number]      DOB: [birth year]  Chief Complaint  Patient presents with   Follow-up    Follow up allergies chest congestion x 3 days    HPI Ms. [name] is here for follow up and re-evaluation of chronic medical conditions, medication management and review of any available recent lab and radiology data.  Preventive health is updated, specifically  Cancer screening and Immunization.   Questions or concerns regarding consultations or procedures which the PT has had in the interim are  addressed. The PT denies any adverse reactions to current medications since the last visit.  3 day h/o increased post nasal drainage , and cough, not using allergy medication daily C/o inability to lose weight , wants to get in person dietary advice ROS Denies recent fever or chills.  Denies chest pains, palpitations and leg swelling Denies abdominal pain, nausea, vomiting,diarrhea or constipation.   Denies dysuria, frequency, hesitancy or incontinence. Denies joint pain, swelling and limitation in mobility. Denies headaches, seizures, numbness, or tingling. Denies depression, anxiety or insomnia. Denies skin break down or rash.   PE BP 120/84   Pulse 87   Ht 5\' 3"  (1.6 m)   Wt 234 lb (106.1 kg)   SpO2 96%   BMI 41.45 kg/m    Patient alert and oriented and in no cardiopulmonary distress.  HEENT: No facial asymmetry, EOMI,     Neck supple .  Chest: Clear to auscultation bilaterally.  CVS: S1, S2 no murmurs, no S3.Regular rate.  ABD: Soft non tender.   Ext: No edema  MS: Adequate ROM spine, shoulders, hips and knees.  Skin: Intact, no ulcerations or rash noted.  Psych: Good eye contact, normal affect. Memory intact not anxious or depressed appearing.  CNS: CN 2-12 intact, power,  normal throughout.no focal deficits noted.   Assessment & Plan  Essential hypertension Controlled, no change in medication DASH diet and commitment to daily physical  activity for a minimum of 30 minutes discussed and encouraged, as a part of hypertension management. The importance of attaining a healthy weight is also discussed.     03/19/2023    2:02 PM 03/19/2023    1:26 PM 01/09/2023    1:34 PM 12/04/2022    2:48 PM 12/04/2022    2:23 PM 12/04/2022    2:22 PM 11/26/2022    5:30 PM  BP/Weight  Systolic BP 120 97 120 120 124 105 146  Diastolic BP 84 62 74 72 78 44 86  Wt. (Lbs)  234 230.8   231   BMI  41.45 kg/m2 40.88 kg/m2   42.25 kg/m2        Hyperlipemia Hyperlipidemia:Low fat diet discussed and encouraged.   Lipid Panel  Lab Results  Component Value Date   CHOL 131 11/18/2022   HDL 40 11/18/2022   LDLCALC 74 11/18/2022   TRIG 87 11/18/2022   CHOLHDL 3.3 11/18/2022     Updated lab needed at/ before next visit.   MORBID OBESITY  Patient re-educated about  the importance of commitment to a  minimum of 150 minutes of exercise per week as able.  The importance of healthy food choices with portion control discussed, as well as eating regularly and within a 12 hour window most days. The need to choose "clean , green" food 50 to 75% of the time is discussed, as well as to make water the primary drink and set a goal of 64 ounces water daily.  03/19/2023    1:26 PM 01/09/2023    1:34 PM 12/04/2022    2:22 PM  Weight /BMI  Weight 234 lb 230 lb 12.8 oz 231 lb  Height 5\' 3"  (1.6 m) 5\' 3"  (1.6 m) 5\' 2"  (1.575 m)  BMI 41.45 kg/m2 40.88 kg/m2 42.25 kg/m2    Deteriorated,has had surgical intervention, requests nutrition ed as no success with what she is doing  Prediabetes Unchanged Patient educated about the importance of limiting  Carbohydrate intake , the need to commit to daily physical activity for a minimum of 30 minutes , and to commit weight loss. The fact that changes in all these areas will reduce or eliminate all together the development of diabetes is stressed.      Latest Ref Rng & Units 03/17/2023    8:24 AM 01/20/2023    11:38 AM 12/02/2022    8:25 AM 11/26/2022    5:30 PM 11/18/2022    8:34 AM  Diabetic Labs  HbA1c 4.8 - 5.6 % 6.2     6.2   Chol 100 - 199 mg/dL     865   HDL >78 mg/dL     40   Calc LDL 0 - 99 mg/dL     74   Triglycerides 0 - 149 mg/dL     87   Creatinine 4.69 - 1.00 mg/dL 6.29  5.28  4.13  2.44  1.10       03/19/2023    2:02 PM 03/19/2023    1:26 PM 01/09/2023    1:34 PM 12/04/2022    2:48 PM 12/04/2022    2:23 PM 12/04/2022    2:22 PM 11/26/2022    5:30 PM  BP/Weight  Systolic BP 120 97 120 120 124 105 146  Diastolic BP 84 62 74 72 78 44 86  Wt. (Lbs)  234 230.8   231   BMI  41.45 kg/m2 40.88 kg/m2   42.25 kg/m2        No data to display          Updated lab needed at/ before next visit.   Vitamin D deficiency Updated lab needed at/ before next visit.

## 2023-03-19 NOTE — Patient Instructions (Addendum)
F/U in 14 to 16 weeks  Fasting lipid, cmp and EGFR, hBA1C, TSH, Vit D 3  uric acid level,to 5 days before next appt   Will f/u on diabetic ed with you by end of week   No med changes  It is important that you exercise regularly at least 30 minutes 5 times a week. If you develop chest pain, have severe difficulty breathing, or feel very tired, stop exercising immediately and seek medical attention  Thanks for choosing [facility], we consider it a privelige to serve you.

## 2023-03-21 ENCOUNTER — Encounter: Payer: Self-pay | Admitting: Family Medicine

## 2023-03-21 MED ORDER — AMLODIPINE BESYLATE 5 MG PO TABS: ORAL_TABLET | ORAL | 2 refills | Status: DC

## 2023-03-21 MED ORDER — AMLODIPINE BESYLATE 2.5 MG PO TABS: 2.5000 mg | ORAL_TABLET | Freq: Every day | ORAL | 2 refills | Status: DC

## 2023-03-21 MED ORDER — FLUTICASONE PROPIONATE 50 MCG/ACT NA SUSP: 1.0000 | Freq: Two times a day (BID) | NASAL | 5 refills | Status: AC

## 2023-03-21 MED ORDER — AZELASTINE HCL 0.1 % NA SOLN: 2.0000 | Freq: Two times a day (BID) | NASAL | 5 refills | Status: DC

## 2023-03-21 NOTE — Assessment & Plan Note (Signed)
  Patient re-educated about  the importance of commitment to a  minimum of 150 minutes of exercise per week as able.  The importance of healthy food choices with portion control discussed, as well as eating regularly and within a 12 hour window most days. The need to choose "clean , green" food 50 to 75% of the time is discussed, as well as to make water the primary drink and set a goal of 64 ounces water daily.       03/19/2023    1:26 PM 01/09/2023    1:34 PM 12/04/2022    2:22 PM  Weight /BMI  Weight 234 lb 230 lb 12.8 oz 231 lb  Height 5\' 3"  (1.6 m) 5\' 3"  (1.6 m) 5\' 2"  (1.575 m)  BMI 41.45 kg/m2 40.88 kg/m2 42.25 kg/m2    Deteriorated,has had surgical intervention, requests nutrition ed as no success with what she is doing

## 2023-03-21 NOTE — Assessment & Plan Note (Signed)
Updated lab needed at/ before next visit.   

## 2023-03-21 NOTE — Assessment & Plan Note (Signed)
Unchanged Patient educated about the importance of limiting  Carbohydrate intake , the need to commit to daily physical activity for a minimum of 30 minutes , and to commit weight loss. The fact that changes in all these areas will reduce or eliminate all together the development of diabetes is stressed.      Latest Ref Rng & Units 03/17/2023    8:24 AM 01/20/2023   11:38 AM 12/02/2022    8:25 AM 11/26/2022    5:30 PM 11/18/2022    8:34 AM  Diabetic Labs  HbA1c 4.8 - 5.6 % 6.2     6.2   Chol 100 - 199 mg/dL     161   HDL >09 mg/dL     40   Calc LDL 0 - 99 mg/dL     74   Triglycerides 0 - 149 mg/dL     87   Creatinine 6.04 - 1.00 mg/dL 5.40  9.81  1.91  4.78  1.10       03/19/2023    2:02 PM 03/19/2023    1:26 PM 01/09/2023    1:34 PM 12/04/2022    2:48 PM 12/04/2022    2:23 PM 12/04/2022    2:22 PM 11/26/2022    5:30 PM  BP/Weight  Systolic BP 120 97 120 120 124 105 146  Diastolic BP 84 62 74 72 78 44 86  Wt. (Lbs)  234 230.8   231   BMI  41.45 kg/m2 40.88 kg/m2   42.25 kg/m2        No data to display          Updated lab needed at/ before next visit.

## 2023-03-21 NOTE — Assessment & Plan Note (Signed)
Controlled, no change in medication DASH diet and commitment to daily physical activity for a minimum of 30 minutes discussed and encouraged, as a part of hypertension management. The importance of attaining a healthy weight is also discussed.     03/19/2023    2:02 PM 03/19/2023    1:26 PM 01/09/2023    1:34 PM 12/04/2022    2:48 PM 12/04/2022    2:23 PM 12/04/2022    2:22 PM 11/26/2022    5:30 PM  BP/Weight  Systolic BP 120 97 120 120 124 105 146  Diastolic BP 84 62 74 72 78 44 86  Wt. (Lbs)  234 230.8   231   BMI  41.45 kg/m2 40.88 kg/m2   42.25 kg/m2

## 2023-03-21 NOTE — Assessment & Plan Note (Signed)
Hyperlipidemia:Low fat diet discussed and encouraged.   Lipid Panel  Lab Results  Component Value Date   CHOL 131 11/18/2022   HDL 40 11/18/2022   LDLCALC 74 11/18/2022   TRIG 87 11/18/2022   CHOLHDL 3.3 11/18/2022     Updated lab needed at/ before next visit.

## 2023-04-25 ENCOUNTER — Encounter: Payer: Self-pay | Admitting: Family Medicine

## 2023-04-25 ENCOUNTER — Other Ambulatory Visit: Payer: Self-pay

## 2023-04-25 MED ORDER — AMLODIPINE BESYLATE 5 MG PO TABS: ORAL_TABLET | ORAL | 2 refills | Status: DC

## 2023-05-28 ENCOUNTER — Ambulatory Visit: Payer: BC Managed Care – PPO | Admitting: Nutrition

## 2023-06-24 ENCOUNTER — Encounter: Payer: Self-pay | Admitting: Family Medicine

## 2023-06-24 ENCOUNTER — Other Ambulatory Visit: Payer: Self-pay

## 2023-06-24 MED ORDER — ALLOPURINOL 300 MG PO TABS: ORAL_TABLET | ORAL | 5 refills | Status: DC

## 2023-06-24 NOTE — Telephone Encounter (Signed)
Refill sent.

## 2023-06-25 ENCOUNTER — Ambulatory Visit: Payer: BC Managed Care – PPO | Admitting: Family Medicine

## 2023-08-12 LAB — CMP14+EGFR
ALT: 18 [IU]/L (ref 0–32)
AST: 19 [IU]/L (ref 0–40)
Albumin: 4.2 g/dL (ref 3.9–4.9)
Alkaline Phosphatase: 100 [IU]/L (ref 44–121)
BUN/Creatinine Ratio: 14 (ref 9–23)
BUN: 19 mg/dL (ref 6–24)
Bilirubin Total: 0.4 mg/dL (ref 0.0–1.2)
CO2: 23 mmol/L (ref 20–29)
Calcium: 10 mg/dL (ref 8.7–10.2)
Chloride: 100 mmol/L (ref 96–106)
Creatinine, Ser: 1.36 mg/dL — ABNORMAL HIGH (ref 0.57–1.00)
Globulin, Total: 2.6 g/dL (ref 1.5–4.5)
Glucose: 97 mg/dL (ref 70–99)
Potassium: 3.3 mmol/L — ABNORMAL LOW (ref 3.5–5.2)
Sodium: 139 mmol/L (ref 134–144)
Total Protein: 6.8 g/dL (ref 6.0–8.5)
eGFR: 48 mL/min/{1.73_m2} — ABNORMAL LOW (ref >59)

## 2023-08-12 LAB — VITAMIN D 25 HYDROXY (VIT D DEFICIENCY, FRACTURES): Vit D, 25-Hydroxy: 33.8 ng/mL (ref 30.0–100.0)

## 2023-08-12 LAB — LIPID PANEL
Chol/HDL Ratio: 3.5 {ratio} (ref 0.0–4.4)
Cholesterol, Total: 146 mg/dL (ref 100–199)
HDL: 42 mg/dL (ref >39)
LDL Chol Calc (NIH): 84 mg/dL (ref 0–99)
Triglycerides: 107 mg/dL (ref 0–149)
VLDL Cholesterol Cal: 20 mg/dL (ref 5–40)

## 2023-08-12 LAB — TSH: TSH: 2.66 u[IU]/mL (ref 0.450–4.500)

## 2023-08-12 LAB — URIC ACID: Uric Acid: 3.2 mg/dL (ref 2.6–6.2)

## 2023-08-12 LAB — HEMOGLOBIN A1C
Est. average glucose Bld gHb Est-mCnc: 137 mg/dL
Hgb A1c MFr Bld: 6.4 % — ABNORMAL HIGH (ref 4.8–5.6)

## 2023-08-14 ENCOUNTER — Encounter: Payer: Self-pay | Admitting: Family Medicine

## 2023-08-14 ENCOUNTER — Ambulatory Visit (INDEPENDENT_AMBULATORY_CARE_PROVIDER_SITE_OTHER): Payer: BC Managed Care – PPO | Admitting: Family Medicine

## 2023-08-14 VITALS — BP 120/82 | HR 70 | Ht 63.0 in | Wt 222.0 lb

## 2023-08-14 DIAGNOSIS — R7303 Prediabetes: Secondary | ICD-10-CM | POA: Diagnosis not present

## 2023-08-14 DIAGNOSIS — Z23 Encounter for immunization: Secondary | ICD-10-CM | POA: Diagnosis not present

## 2023-08-14 DIAGNOSIS — E790 Hyperuricemia without signs of inflammatory arthritis and tophaceous disease: Secondary | ICD-10-CM

## 2023-08-14 DIAGNOSIS — I10 Essential (primary) hypertension: Secondary | ICD-10-CM

## 2023-08-14 DIAGNOSIS — E876 Hypokalemia: Secondary | ICD-10-CM

## 2023-08-14 NOTE — Patient Instructions (Addendum)
F/U in 5 to 6 weeks re evaluate blood pressre, need previsit labs 3 to 5 days before non fasting  INCREASE total amlodipine dose to 10 mg every day ( two 5 mg tabs, oR four 2.5 mg tabs, oR one 5mg  tab and two 2.5 mg tabs)  Decrease triamterene to half tablet every day  Reduce allopurinol to 3 days  per week  We are providing paperwork on carb counting  Non fasting chem 7 and EGFr and uric acid level 3 to 5 days before visit  INCREASE vegetables , colored ones  Flu vaccine today  Discuss carb content of food wih your diabetic educator It is important that you exercise regularly at least 30 minutes 5 times a week. If you develop chest pain, have severe difficulty breathing, or feel very tired, stop exercising immediately and seek medical attention    Thanks for choosing [facility], we consider it a privelige to serve you.   Nurse pls document pap she had in 12/2022 I am unable

## 2023-08-18 DIAGNOSIS — Z23 Encounter for immunization: Secondary | ICD-10-CM | POA: Insufficient documentation

## 2023-08-18 DIAGNOSIS — E790 Hyperuricemia without signs of inflammatory arthritis and tophaceous disease: Secondary | ICD-10-CM | POA: Insufficient documentation

## 2023-08-18 NOTE — Assessment & Plan Note (Signed)
Controlled , however will decrease maxzide dose and inc amlodipine due to CKD, re eval in 5 to 6 weeks DASH diet and commitment to daily physical activity for a minimum of 30 minutes discussed and encouraged, as a part of hypertension management. The importance of attaining a healthy weight is also discussed.     08/14/2023    8:49 AM 08/14/2023    8:14 AM 03/19/2023    2:02 PM 03/19/2023    1:26 PM 01/09/2023    1:34 PM 12/04/2022    2:48 PM 12/04/2022    2:23 PM  BP/Weight  Systolic BP 120 119 120 97 120 120 124  Diastolic BP 82 79 84 62 74 72 78  Wt. (Lbs)  222.04  234 230.8    BMI  39.33 kg/m2  41.45 kg/m2 40.88 kg/m2

## 2023-08-18 NOTE — Assessment & Plan Note (Signed)
Worsened, mneeds to casrb count Patient educated about the importance of limiting  Carbohydrate intake , the need to commit to daily physical activity for a minimum of 30 minutes , and to commit weight loss. The fact that changes in all these areas will reduce or eliminate all together the development of diabetes is stressed.      Latest Ref Rng & Units 08/11/2023    8:26 AM 03/17/2023    8:24 AM 01/20/2023   11:38 AM 12/02/2022    8:25 AM 11/26/2022    5:30 PM  Diabetic Labs  HbA1c 4.8 - 5.6 % 6.4  6.2      Chol 100 - 199 mg/dL 119       HDL >14 mg/dL 42       Calc LDL 0 - 99 mg/dL 84       Triglycerides 0 - 149 mg/dL 782       Creatinine 9.56 - 1.00 mg/dL 2.13  0.86  5.78  4.69  1.33       08/14/2023    8:49 AM 08/14/2023    8:14 AM 03/19/2023    2:02 PM 03/19/2023    1:26 PM 01/09/2023    1:34 PM 12/04/2022    2:48 PM 12/04/2022    2:23 PM  BP/Weight  Systolic BP 120 119 120 97 120 120 124  Diastolic BP 82 79 84 62 74 72 78  Wt. (Lbs)  222.04  234 230.8    BMI  39.33 kg/m2  41.45 kg/m2 40.88 kg/m2         No data to display

## 2023-08-18 NOTE — Assessment & Plan Note (Signed)
Reduce allopurinol dose and  re val

## 2023-08-18 NOTE — Assessment & Plan Note (Signed)
Hyperlipidemia:Low fat diet discussed and encouraged. Controlled, no change in medication    Lipid Panel  Lab Results  Component Value Date   CHOL 146 08/11/2023   HDL 42 08/11/2023   LDLCALC 84 08/11/2023   TRIG 107 08/11/2023   CHOLHDL 3.5 08/11/2023

## 2023-08-18 NOTE — Assessment & Plan Note (Signed)
After obtaining informed consent, the vaccine is  administered , with no adverse effect noted at the time of administration.  

## 2023-08-18 NOTE — Assessment & Plan Note (Signed)
  Patient re-educated about  the importance of commitment to a  minimum of 150 minutes of exercise per week as able.  The importance of healthy food choices with portion control discussed, as well as eating regularly and within a 12 hour window most days. The need to choose "clean , green" food 50 to 75% of the time is discussed, as well as to make water the primary drink and set a goal of 64 ounces water daily.       08/14/2023    8:14 AM 03/19/2023    1:26 PM 01/09/2023    1:34 PM  Weight /BMI  Weight 222 lb 0.6 oz 234 lb 230 lb 12.8 oz  Height 5\' 3"  (1.6 m) 5\' 3"  (1.6 m) 5\' 3"  (1.6 m)  BMI 39.33 kg/m2 41.45 kg/m2 40.88 kg/m2    Marked improvement, applauded on this, continue same

## 2023-08-18 NOTE — Progress Notes (Signed)
[name]     MRN: [id number]      DOB: [birth year]  Chief Complaint  Patient presents with   Follow-up    Follow up    HPI [name] is here for follow up and re-evaluation of chronic medical conditions, medication management and review of any available recent lab and radiology data.  Preventive health is updated, specifically  Cancer screening and Immunization.   Doing kick boxing, changing diet , disappointed that blood sugar has increased, not counting carbs , does not know how, meeting with on like educator, will need to learn Has lost weight   ROS Denies recent fever or chills. Denies sinus pressure, nasal congestion, ear pain or sore throat. Denies chest congestion, productive cough or wheezing. Denies chest pains, palpitations and leg swelling Denies abdominal pain, nausea, vomiting,diarrhea or constipation.   Denies dysuria, frequency, hesitancy or incontinence. Denies joint pain, swelling and limitation in mobility. Denies headaches, seizures, numbness, or tingling. Denies depression, anxiety or insomnia. Denies skin break down or rash.   PE  BP 120/82   Pulse 70   Ht 5\' 3"  (1.6 m)   Wt 222 lb 0.6 oz (100.7 kg)   SpO2 98%   BMI 39.33 kg/m   Patient alert and oriented and in no cardiopulmonary distress.  HEENT: No facial asymmetry, EOMI,     Neck supple .  Chest: Clear to auscultation bilaterally.  CVS: S1, S2 no murmurs, no S3.Regular rate.  ABD: Soft non tender.   Ext: No edema  MS: Adequate ROM spine, shoulders, hips and knees.  Skin: Intact, no ulcerations or rash noted.  Psych: Good eye contact, normal affect. Memory intact not anxious or depressed appearing.  CNS: CN 2-12 intact, power,  normal throughout.no focal deficits noted.   Assessment & Plan  Prediabetes Worsened, mneeds to casrb count Patient educated about the importance of limiting  Carbohydrate intake , the need to commit to daily physical activity for a  minimum of 30 minutes , and to commit weight loss. The fact that changes in all these areas will reduce or eliminate all together the development of diabetes is stressed.      Latest Ref Rng & Units 08/11/2023    8:26 AM 03/17/2023    8:24 AM 01/20/2023   11:38 AM 12/02/2022    8:25 AM 11/26/2022    5:30 PM  Diabetic Labs  HbA1c 4.8 - 5.6 % 6.4  6.2      Chol 100 - 199 mg/dL 784       HDL >69 mg/dL 42       Calc LDL 0 - 99 mg/dL 84       Triglycerides 0 - 149 mg/dL 629       Creatinine 5.28 - 1.00 mg/dL 4.13  2.44  0.10  2.72  1.33       08/14/2023    8:49 AM 08/14/2023    8:14 AM 03/19/2023    2:02 PM 03/19/2023    1:26 PM 01/09/2023    1:34 PM 12/04/2022    2:48 PM 12/04/2022    2:23 PM  BP/Weight  Systolic BP 120 119 120 97 120 120 124  Diastolic BP 82 79 84 62 74 72 78  Wt. (Lbs)  222.04  234 230.8    BMI  39.33 kg/m2  41.45 kg/m2 40.88 kg/m2         No data to display            Essential  hypertension Controlled , however will decrease maxzide dose and inc amlodipine due to CKD, re eval in 5 to 6 weeks DASH diet and commitment to daily physical activity for a minimum of 30 minutes discussed and encouraged, as a part of hypertension management. The importance of attaining a healthy weight is also discussed.     08/14/2023    8:49 AM 08/14/2023    8:14 AM 03/19/2023    2:02 PM 03/19/2023    1:26 PM 01/09/2023    1:34 PM 12/04/2022    2:48 PM 12/04/2022    2:23 PM  BP/Weight  Systolic BP 120 119 120 97 120 120 124  Diastolic BP 82 79 84 62 74 72 78  Wt. (Lbs)  222.04  234 230.8    BMI  39.33 kg/m2  41.45 kg/m2 40.88 kg/m2         Hypokalemia Hyperlipidemia:Low fat diet discussed and encouraged. Controlled, no change in medication    Lipid Panel  Lab Results  Component Value Date   CHOL 146 08/11/2023   HDL 42 08/11/2023   LDLCALC 84 08/11/2023   TRIG 107 08/11/2023   CHOLHDL 3.5 08/11/2023       MORBID OBESITY  Patient re-educated about  the  importance of commitment to a  minimum of 150 minutes of exercise per week as able.  The importance of healthy food choices with portion control discussed, as well as eating regularly and within a 12 hour window most days. The need to choose "clean , green" food 50 to 75% of the time is discussed, as well as to make water the primary drink and set a goal of 64 ounces water daily.       08/14/2023    8:14 AM 03/19/2023    1:26 PM 01/09/2023    1:34 PM  Weight /BMI  Weight 222 lb 0.6 oz 234 lb 230 lb 12.8 oz  Height 5\' 3"  (1.6 m) 5\' 3"  (1.6 m) 5\' 3"  (1.6 m)  BMI 39.33 kg/m2 41.45 kg/m2 40.88 kg/m2    Marked improvement, applauded on this, continue same  Immunization due After obtaining informed consent, the vaccine is  administered , with no adverse effect noted at the time of administration.   Hyperuricemia Reduce allopurinol dose and  re val

## 2023-09-18 ENCOUNTER — Encounter: Payer: Self-pay | Admitting: Family Medicine

## 2023-09-18 ENCOUNTER — Ambulatory Visit (INDEPENDENT_AMBULATORY_CARE_PROVIDER_SITE_OTHER): Payer: BC Managed Care – PPO | Admitting: Family Medicine

## 2023-09-18 VITALS — BP 128/82 | HR 60 | Resp 16 | Ht 63.0 in | Wt 227.0 lb

## 2023-09-18 DIAGNOSIS — R110 Nausea: Secondary | ICD-10-CM | POA: Diagnosis not present

## 2023-09-18 DIAGNOSIS — I10 Essential (primary) hypertension: Secondary | ICD-10-CM | POA: Diagnosis not present

## 2023-09-18 MED ORDER — TRIAMTERENE-HCTZ 37.5-25 MG PO TABS: 1.0000 | ORAL_TABLET | Freq: Every day | ORAL | 3 refills | Status: AC

## 2023-09-18 MED ORDER — ONDANSETRON HCL 4 MG PO TABS: 4.0000 mg | ORAL_TABLET | Freq: Three times a day (TID) | ORAL | 0 refills | Status: AC | PRN

## 2023-09-18 NOTE — Progress Notes (Signed)
[name]     MRN: [id number]      DOB: [birth year]  Chief Complaint  Patient presents with   Hypertension    6 week follow up      HPI [name] is here for follow up and re-evaluation of chronic medical conditions, medication management and review of any available recent lab and radiology data.  Particular concern is renal function with change in dose of BP medication 3 day h/o epigastric discomfort, bubbling, was out of work for 1 day, no vomit or diarrhea, no fever or chills Preventive health is updated, specifically  Cancer screening and Immunization.      ROS Denies recent fever or chills. Denies sinus pressure, nasal congestion, ear pain or sore throat. Denies chest congestion, productive cough or wheezing. Denies chest pains, palpitations and leg swelling    Denies dysuria, frequency, hesitancy or incontinence. Denies joint pain, swelling and limitation in mobility. Denies headaches, seizures, numbness, or tingling. Denies depression, anxiety or insomnia. Denies skin break down or rash.   PE  BP 128/82   Pulse 60   Resp 16   Ht 5\' 3"  (1.6 m)   Wt 227 lb (103 kg)   SpO2 98%   BMI 40.21 kg/m   Patient alert and oriented and in no cardiopulmonary distress.  HEENT: No facial asymmetry, EOMI,     Neck supple .  Chest: Clear to auscultation bilaterally.  CVS: S1, S2 no murmurs, no S3.Regular rate.  ABD: Soft mild epigastric tenderness on superficial palpation.   Ext: No edema  MS: Adequate ROM spine, shoulders, hips and knees.  Skin: Intact, no ulcerations or rash noted.  Psych: Good eye contact, normal affect. Memory intact not anxious or depressed appearing.  CNS: CN 2-12 intact, power,  normal throughout.no focal deficits noted.   Assessment & Plan  Essential hypertension Controlled, no change in medication DASH diet and commitment to daily physical activity for a minimum of 30 minutes discussed and encouraged, as a part of  hypertension management. The importance of attaining a healthy weight is also discussed.     09/18/2023    9:09 AM 09/18/2023    9:01 AM 08/14/2023    8:49 AM 08/14/2023    8:14 AM 03/19/2023    2:02 PM 03/19/2023    1:26 PM 01/09/2023    1:34 PM  BP/Weight  Systolic BP 128 113 120 119 120 97 120  Diastolic BP 82 75 82 79 84 62 74  Wt. (Lbs)  227  222.04  234 230.8  BMI  40.21 kg/m2  39.33 kg/m2  41.45 kg/m2 40.88 kg/m2       Nausea 3 day history, improving, however will send in zofran for as needed use if needed  MORBID OBESITY  Patient re-educated about  the importance of commitment to a  minimum of 150 minutes of exercise per week as able.  The importance of healthy food choices with portion control discussed, as well as eating regularly and within a 12 hour window most days. The need to choose "clean , green" food 50 to 75% of the time is discussed, as well as to make water the primary drink and set a goal of 64 ounces water daily.       09/18/2023    9:01 AM 08/14/2023    8:14 AM 03/19/2023    1:26 PM  Weight /BMI  Weight 227 lb 222 lb 0.6 oz 234 lb  Height 5\' 3"  (1.6 m) 5\' 3"  (1.6  m) 5\' 3"  (1.6 m)  BMI 40.21 kg/m2 39.33 kg/m2 41.45 kg/m2    Deteriorated

## 2023-09-18 NOTE — Patient Instructions (Signed)
F/U in 4.5 months, call if you need me sooner  Will message  re labs  Check in new year for coverage for either wegovy or zepbound for weight loss\   It is important that you exercise regularly at least 30 minutes 5 times a week. If you develop chest pain, have severe difficulty breathing, or feel very tired, stop exercising immediately and seek medical attention    Think about what you will eat, plan ahead. Choose " clean, green, fresh or frozen" over canned, processed or packaged foods which are more sugary, salty and fatty. 70 to 75% of food eaten should be vegetables and fruit. Three meals at set times with snacks allowed between meals, but they must be fruit or vegetables. Aim to eat over a 12 hour period , example 7 am to 7 pm, and STOP after  your last meal of the day. Drink water,generally about 64 ounces per day, no other drink is as healthy. Fruit juice is best enjoyed in a healthy way, by EATING the fruit.  Thanks for choosing [facility], we consider it a privelige to serve you.

## 2023-09-18 NOTE — Assessment & Plan Note (Signed)
Controlled, no change in medication DASH diet and commitment to daily physical activity for a minimum of 30 minutes discussed and encouraged, as a part of hypertension management. The importance of attaining a healthy weight is also discussed.     09/18/2023    9:09 AM 09/18/2023    9:01 AM 08/14/2023    8:49 AM 08/14/2023    8:14 AM 03/19/2023    2:02 PM 03/19/2023    1:26 PM 01/09/2023    1:34 PM  BP/Weight  Systolic BP 128 113 120 119 120 97 120  Diastolic BP 82 75 82 79 84 62 74  Wt. (Lbs)  227  222.04  234 230.8  BMI  40.21 kg/m2  39.33 kg/m2  41.45 kg/m2 40.88 kg/m2

## 2023-09-18 NOTE — Assessment & Plan Note (Signed)
  Patient re-educated about  the importance of commitment to a  minimum of 150 minutes of exercise per week as able.  The importance of healthy food choices with portion control discussed, as well as eating regularly and within a 12 hour window most days. The need to choose "clean , green" food 50 to 75% of the time is discussed, as well as to make water the primary drink and set a goal of 64 ounces water daily.       09/18/2023    9:01 AM 08/14/2023    8:14 AM 03/19/2023    1:26 PM  Weight /BMI  Weight 227 lb 222 lb 0.6 oz 234 lb  Height 5\' 3"  (1.6 m) 5\' 3"  (1.6 m) 5\' 3"  (1.6 m)  BMI 40.21 kg/m2 39.33 kg/m2 41.45 kg/m2    Deteriorated

## 2023-09-18 NOTE — Assessment & Plan Note (Signed)
3 day history, improving, however will send in zofran for as needed use if needed

## 2023-09-19 LAB — BMP8+EGFR
BUN/Creatinine Ratio: 14 (ref 9–23)
BUN: 17 mg/dL (ref 6–24)
CO2: 24 mmol/L (ref 20–29)
Calcium: 9.4 mg/dL (ref 8.7–10.2)
Chloride: 104 mmol/L (ref 96–106)
Creatinine, Ser: 1.24 mg/dL — ABNORMAL HIGH (ref 0.57–1.00)
Glucose: 98 mg/dL (ref 70–99)
Potassium: 3.7 mmol/L (ref 3.5–5.2)
Sodium: 142 mmol/L (ref 134–144)
eGFR: 54 mL/min/{1.73_m2} — ABNORMAL LOW (ref >59)

## 2023-09-19 LAB — URIC ACID: Uric Acid: 3 mg/dL (ref 2.6–6.2)

## 2023-12-28 ENCOUNTER — Other Ambulatory Visit: Payer: Self-pay | Admitting: Family Medicine

## 2023-12-29 ENCOUNTER — Other Ambulatory Visit: Payer: Self-pay

## 2023-12-29 MED ORDER — AMLODIPINE BESYLATE 10 MG PO TABS: 10.0000 mg | ORAL_TABLET | Freq: Every day | ORAL | 3 refills | Status: DC

## 2024-01-02 ENCOUNTER — Other Ambulatory Visit: Payer: Self-pay | Admitting: Family Medicine

## 2024-01-05 ENCOUNTER — Other Ambulatory Visit: Payer: Self-pay | Admitting: Family Medicine

## 2024-01-05 ENCOUNTER — Other Ambulatory Visit: Payer: Self-pay

## 2024-01-05 MED ORDER — ALLOPURINOL 300 MG PO TABS
ORAL_TABLET | ORAL | 0 refills | Status: DC
  Filled 2024-01-05: qty 30, 70d supply, fill #0

## 2024-01-06 ENCOUNTER — Encounter: Payer: Self-pay | Admitting: Family Medicine

## 2024-01-06 ENCOUNTER — Other Ambulatory Visit: Payer: Self-pay

## 2024-01-06 MED ORDER — ROSUVASTATIN CALCIUM 40 MG PO TABS: 40.0000 mg | ORAL_TABLET | Freq: Every day | ORAL | 1 refills | Status: DC

## 2024-01-15 ENCOUNTER — Other Ambulatory Visit: Payer: Self-pay | Admitting: Family Medicine

## 2024-01-15 ENCOUNTER — Other Ambulatory Visit: Payer: Self-pay

## 2024-01-16 ENCOUNTER — Encounter: Payer: Self-pay | Admitting: Family Medicine

## 2024-01-16 ENCOUNTER — Telehealth: Payer: Self-pay | Admitting: Pharmacy Technician

## 2024-01-16 ENCOUNTER — Ambulatory Visit (INDEPENDENT_AMBULATORY_CARE_PROVIDER_SITE_OTHER): Payer: BC Managed Care – PPO | Admitting: Family Medicine

## 2024-01-16 ENCOUNTER — Other Ambulatory Visit (HOSPITAL_COMMUNITY): Payer: Self-pay

## 2024-01-16 VITALS — BP 122/83 | HR 70 | Resp 16 | Ht 62.0 in | Wt 226.4 lb

## 2024-01-16 DIAGNOSIS — Z23 Encounter for immunization: Secondary | ICD-10-CM | POA: Insufficient documentation

## 2024-01-16 DIAGNOSIS — E7800 Pure hypercholesterolemia, unspecified: Secondary | ICD-10-CM

## 2024-01-16 DIAGNOSIS — I10 Essential (primary) hypertension: Secondary | ICD-10-CM | POA: Diagnosis not present

## 2024-01-16 DIAGNOSIS — E790 Hyperuricemia without signs of inflammatory arthritis and tophaceous disease: Secondary | ICD-10-CM | POA: Diagnosis not present

## 2024-01-16 DIAGNOSIS — R7303 Prediabetes: Secondary | ICD-10-CM

## 2024-01-16 MED ORDER — LORATADINE 10 MG PO TABS: 10.0000 mg | ORAL_TABLET | Freq: Every day | ORAL | 1 refills | Status: AC

## 2024-01-16 MED ORDER — ALLOPURINOL 300 MG PO TABS
ORAL_TABLET | ORAL | 1 refills | Status: AC
Start: 2024-01-16 — End: ?

## 2024-01-16 MED ORDER — PHENTERMINE HCL 37.5 MG PO TABS: ORAL_TABLET | ORAL | 1 refills | Status: DC

## 2024-01-16 NOTE — Assessment & Plan Note (Signed)
  Patient re-educated about  the importance of commitment to a  minimum of 150 minutes of exercise per week as able.  The importance of healthy food choices with portion control discussed, as well as eating regularly and within a 12 hour window most days. The need to choose "clean , green" food 50 to 75% of the time is discussed, as well as to make water the primary drink and set a goal of 64 ounces water daily.       01/16/2024    8:53 AM 09/18/2023    9:01 AM 08/14/2023    8:14 AM  Weight /BMI  Weight 226 lb 6.4 oz 227 lb 222 lb 0.6 oz  Height 5\' 2"  (1.575 m) 5\' 3"  (1.6 m) 5\' 3"  (1.6 m)  BMI 41.41 kg/m2 40.21 kg/m2 39.33 kg/m2    Unchanged, start half phentermine daily

## 2024-01-16 NOTE — Assessment & Plan Note (Signed)
 Controlled, no change in medication DASH diet and commitment to daily physical activity for a minimum of 30 minutes discussed and encouraged, as a part of hypertension management. The importance of attaining a healthy weight is also discussed.     01/16/2024    8:53 AM 09/18/2023    9:09 AM 09/18/2023    9:01 AM 08/14/2023    8:49 AM 08/14/2023    8:14 AM 03/19/2023    2:02 PM 03/19/2023    1:26 PM  BP/Weight  Systolic BP 122 128 113 120 119 120 97  Diastolic BP 83 82 75 82 79 84 62  Wt. (Lbs) 226.4  227  222.04  234  BMI 41.41 kg/m2  40.21 kg/m2  39.33 kg/m2  41.45 kg/m2

## 2024-01-16 NOTE — Assessment & Plan Note (Signed)
 Patient educated about the importance of limiting  Carbohydrate intake , the need to commit to daily physical activity for a minimum of 30 minutes , and to commit weight loss. The fact that changes in all these areas will reduce or eliminate all together the development of diabetes is stressed.      Latest Ref Rng & Units 09/18/2023    8:50 AM 08/11/2023    8:26 AM 03/17/2023    8:24 AM 01/20/2023   11:38 AM 12/02/2022    8:25 AM  Diabetic Labs  HbA1c 4.8 - 5.6 %  6.4  6.2     Chol 100 - 199 mg/dL  725      HDL >36 mg/dL  42      Calc LDL 0 - 99 mg/dL  84      Triglycerides 0 - 149 mg/dL  644      Creatinine 0.34 - 1.00 mg/dL 7.42  5.95  6.38  7.56  1.15       01/16/2024    8:53 AM 09/18/2023    9:09 AM 09/18/2023    9:01 AM 08/14/2023    8:49 AM 08/14/2023    8:14 AM 03/19/2023    2:02 PM 03/19/2023    1:26 PM  BP/Weight  Systolic BP 122 128 113 120 119 120 97  Diastolic BP 83 82 75 82 79 84 62  Wt. (Lbs) 226.4  227  222.04  234  BMI 41.41 kg/m2  40.21 kg/m2  39.33 kg/m2  41.45 kg/m2       No data to display          Updated lab needed at/ before next visit.

## 2024-01-16 NOTE — Assessment & Plan Note (Signed)
 Updated lab needed at/ before next visit.

## 2024-01-16 NOTE — Progress Notes (Signed)
 SHALEIGH LAUBSCHER     MRN: [id number]      DOB: [birth year]  Chief Complaint  Patient presents with   Hypertension    4 month follow up    HPI [name] is here for follow up and re-evaluation of chronic medical conditions, medication management and review of any available recent lab and radiology data.  Preventive health is updated, specifically  Cancer screening and Immunization.   Questions or concerns regarding consultations or procedures which the PT has had in the interim are  addressed. The PT denies any adverse reactions to current medications since the last visit.  Struggling with weight loss , interested in tablet not injection to help with this   ROS Denies recent fever or chills. Denies sinus pressure, nasal congestion, ear pain or sore throat. Denies chest congestion, productive cough or wheezing. Denies chest pains, palpitations and leg swelling Denies abdominal pain, nausea, vomiting,diarrhea or constipation.   Denies dysuria, frequency, hesitancy or incontinence. Denies joint pain, swelling and limitation in mobility. Denies headaches, seizures, numbness, or tingling. Denies depression, anxiety or insomnia. Denies skin break down or rash.   PE  BP 122/83   Pulse 70   Resp 16   Ht 5\' 2"  (1.575 m)   Wt 226 lb 6.4 oz (102.7 kg)   SpO2 97%   BMI 41.41 kg/m   Patient alert and oriented and in no cardiopulmonary distress.  HEENT: No facial asymmetry, EOMI,     Neck supple .  Chest: Clear to auscultation bilaterally.  CVS: S1, S2 no murmurs, no S3.Regular rate.  ABD: Soft non tender.   Ext: No edema  MS: Adequate ROM spine, shoulders, hips and knees.  Skin: Intact, no ulcerations or rash noted.  Psych: Good eye contact, normal affect. Memory intact not anxious or depressed appearing.  CNS: CN 2-12 intact, power,  normal throughout.no focal deficits noted.   Assessment & Plan  Hyperuricemia Updated lab needed at/ before next  visit.   Essential hypertension Controlled, no change in medication DASH diet and commitment to daily physical activity for a minimum of 30 minutes discussed and encouraged, as a part of hypertension management. The importance of attaining a healthy weight is also discussed.     01/16/2024    8:53 AM 09/18/2023    9:09 AM 09/18/2023    9:01 AM 08/14/2023    8:49 AM 08/14/2023    8:14 AM 03/19/2023    2:02 PM 03/19/2023    1:26 PM  BP/Weight  Systolic BP 122 128 113 120 119 120 97  Diastolic BP 83 82 75 82 79 84 62  Wt. (Lbs) 226.4  227  222.04  234  BMI 41.41 kg/m2  40.21 kg/m2  39.33 kg/m2  41.45 kg/m2       Hyperlipemia Hyperlipidemia:Low fat diet discussed and encouraged.   Lipid Panel  Lab Results  Component Value Date   CHOL 146 08/11/2023   HDL 42 08/11/2023   LDLCALC 84 08/11/2023   TRIG 107 08/11/2023   CHOLHDL 3.5 08/11/2023      Updated lab needed at/ before next visit.   MORBID OBESITY  Patient re-educated about  the importance of commitment to a  minimum of 150 minutes of exercise per week as able.  The importance of healthy food choices with portion control discussed, as well as eating regularly and within a 12 hour window most days. The need to choose "clean , green" food 50 to 75% of the time is discussed,  as well as to make water the primary drink and set a goal of 64 ounces water daily.       01/16/2024    8:53 AM 09/18/2023    9:01 AM 08/14/2023    8:14 AM  Weight /BMI  Weight 226 lb 6.4 oz 227 lb 222 lb 0.6 oz  Height 5\' 2"  (1.575 m) 5\' 3"  (1.6 m) 5\' 3"  (1.6 m)  BMI 41.41 kg/m2 40.21 kg/m2 39.33 kg/m2    Unchanged, start half phentermine daily  Prediabetes Patient educated about the importance of limiting  Carbohydrate intake , the need to commit to daily physical activity for a minimum of 30 minutes , and to commit weight loss. The fact that changes in all these areas will reduce or eliminate all together the development of diabetes  is stressed.      Latest Ref Rng & Units 09/18/2023    8:50 AM 08/11/2023    8:26 AM 03/17/2023    8:24 AM 01/20/2023   11:38 AM 12/02/2022    8:25 AM  Diabetic Labs  HbA1c 4.8 - 5.6 %  6.4  6.2     Chol 100 - 199 mg/dL  295      HDL >28 mg/dL  42      Calc LDL 0 - 99 mg/dL  84      Triglycerides 0 - 149 mg/dL  413      Creatinine 2.44 - 1.00 mg/dL 0.10  2.72  5.36  6.44  1.15       01/16/2024    8:53 AM 09/18/2023    9:09 AM 09/18/2023    9:01 AM 08/14/2023    8:49 AM 08/14/2023    8:14 AM 03/19/2023    2:02 PM 03/19/2023    1:26 PM  BP/Weight  Systolic BP 122 128 113 120 119 120 97  Diastolic BP 83 82 75 82 79 84 62  Wt. (Lbs) 226.4  227  222.04  234  BMI 41.41 kg/m2  40.21 kg/m2  39.33 kg/m2  41.45 kg/m2       No data to display          Updated lab needed at/ before next visit.   Encounter for immunization After obtaining informed consent, the pneumonia 20 vaccine is  administered , with no adverse effect noted at the time of administration.

## 2024-01-16 NOTE — Patient Instructions (Signed)
 F/U in 10 weeks, call if you need me sooner  New to help with weight loss is phentermine half a tablet once daily in the morning.  Please ensure that you drink 64 ounces of water daily and eat on a regular schedule.  Exercise commitment and healthy food choices will result in weight loss success, wishing you all the best.  Pneumonia 20 vaccine today.  Labs as ordered today results will be sent to you via MyChart.  Thanks for choosing [facility], we consider it a privelige to serve you.

## 2024-01-16 NOTE — Assessment & Plan Note (Signed)
 After obtaining informed consent, the pneumonia 20  vaccine is  administered , with no adverse effect noted at the time of administration.

## 2024-01-16 NOTE — Telephone Encounter (Signed)
 Pharmacy Patient Advocate Encounter  Received notification from [facility] that Prior Authorization for Phentermine HCl 37.5MG  tablets has been APPROVED from 01/16/2024 to 04/15/2024. Ran test claim, Copay is $2.60. This test claim was processed through [facility]- copay amounts may vary at other pharmacies due to pharmacy/plan contracts, or as the patient moves through the different stages of their insurance plan.   PA #/Case ID/Reference #: [id number]

## 2024-01-16 NOTE — Assessment & Plan Note (Signed)
 Hyperlipidemia:Low fat diet discussed and encouraged.   Lipid Panel  Lab Results  Component Value Date   CHOL 146 08/11/2023   HDL 42 08/11/2023   LDLCALC 84 08/11/2023   TRIG 107 08/11/2023   CHOLHDL 3.5 08/11/2023      Updated lab needed at/ before next visit.

## 2024-01-16 NOTE — Telephone Encounter (Signed)
 Pharmacy Patient Advocate Encounter   Received notification from CoverMyMeds that prior authorization for Phentermine HCl 37.5MG  tablets is required/requested.   Insurance verification completed.   The patient is insured through [facility] .   Per test claim: PA required; PA submitted to above mentioned insurance via CoverMyMeds Key/confirmation #/EOC [id number] Status is pending

## 2024-01-17 ENCOUNTER — Encounter: Payer: Self-pay | Admitting: Family Medicine

## 2024-01-17 LAB — CBC WITH DIFFERENTIAL/PLATELET
Basophils Absolute: 0 10*3/uL (ref 0.0–0.2)
Basos: 0 %
EOS (ABSOLUTE): 0.1 10*3/uL (ref 0.0–0.4)
Eos: 1 %
Hematocrit: 42.8 % (ref 34.0–46.6)
Hemoglobin: 14.2 g/dL (ref 11.1–15.9)
Immature Grans (Abs): 0 10*3/uL (ref 0.0–0.1)
Immature Granulocytes: 0 %
Lymphocytes Absolute: 2.7 10*3/uL (ref 0.7–3.1)
Lymphs: 36 %
MCH: 28.6 pg (ref 26.6–33.0)
MCHC: 33.2 g/dL (ref 31.5–35.7)
MCV: 86 fL (ref 79–97)
Monocytes Absolute: 0.5 10*3/uL (ref 0.1–0.9)
Monocytes: 7 %
Neutrophils Absolute: 4.3 10*3/uL (ref 1.4–7.0)
Neutrophils: 56 %
Platelets: 181 10*3/uL (ref 150–450)
RBC: 4.97 x10E6/uL (ref 3.77–5.28)
RDW: 13.5 % (ref 11.7–15.4)
WBC: 7.7 10*3/uL (ref 3.4–10.8)

## 2024-01-17 LAB — CMP14+EGFR
ALT: 21 IU/L (ref 0–32)
AST: 17 IU/L (ref 0–40)
Albumin: 4 g/dL (ref 3.9–4.9)
Alkaline Phosphatase: 96 IU/L (ref 44–121)
BUN/Creatinine Ratio: 13 (ref 9–23)
BUN: 15 mg/dL (ref 6–24)
Bilirubin Total: 0.3 mg/dL (ref 0.0–1.2)
CO2: 25 mmol/L (ref 20–29)
Calcium: 9.9 mg/dL (ref 8.7–10.2)
Chloride: 100 mmol/L (ref 96–106)
Creatinine, Ser: 1.15 mg/dL — ABNORMAL HIGH (ref 0.57–1.00)
Globulin, Total: 2.5 g/dL (ref 1.5–4.5)
Glucose: 86 mg/dL (ref 70–99)
Potassium: 3.9 mmol/L (ref 3.5–5.2)
Sodium: 140 mmol/L (ref 134–144)
Total Protein: 6.5 g/dL (ref 6.0–8.5)
eGFR: 59 mL/min/{1.73_m2} — ABNORMAL LOW (ref >59)

## 2024-01-17 LAB — URIC ACID: Uric Acid: 5.6 mg/dL (ref 2.6–6.2)

## 2024-01-17 LAB — LIPID PANEL W/O CHOL/HDL RATIO
Cholesterol, Total: 135 mg/dL (ref 100–199)
HDL: 41 mg/dL (ref >39)
LDL Chol Calc (NIH): 79 mg/dL (ref 0–99)
Triglycerides: 72 mg/dL (ref 0–149)
VLDL Cholesterol Cal: 15 mg/dL (ref 5–40)

## 2024-01-17 LAB — HEMOGLOBIN A1C
Est. average glucose Bld gHb Est-mCnc: 131 mg/dL
Hgb A1c MFr Bld: 6.2 % — ABNORMAL HIGH (ref 4.8–5.6)

## 2024-03-22 ENCOUNTER — Other Ambulatory Visit: Payer: Self-pay | Admitting: Family Medicine

## 2024-03-26 ENCOUNTER — Ambulatory Visit: Admitting: Family Medicine

## 2024-04-01 ENCOUNTER — Ambulatory Visit: Admitting: Family Medicine

## 2024-04-02 ENCOUNTER — Ambulatory Visit: Admitting: Family Medicine

## 2024-04-05 ENCOUNTER — Other Ambulatory Visit: Payer: Self-pay | Admitting: Family Medicine

## 2024-04-22 ENCOUNTER — Other Ambulatory Visit: Payer: Self-pay | Admitting: Family Medicine

## 2024-04-28 ENCOUNTER — Ambulatory Visit: Admitting: Family Medicine

## 2024-05-20 ENCOUNTER — Ambulatory Visit: Admitting: Family Medicine

## 2024-05-20 DIAGNOSIS — Z1231 Encounter for screening mammogram for malignant neoplasm of breast: Secondary | ICD-10-CM | POA: Diagnosis not present

## 2024-05-20 DIAGNOSIS — Z01419 Encounter for gynecological examination (general) (routine) without abnormal findings: Secondary | ICD-10-CM | POA: Diagnosis not present

## 2024-05-21 ENCOUNTER — Ambulatory Visit (INDEPENDENT_AMBULATORY_CARE_PROVIDER_SITE_OTHER): Admitting: Family Medicine

## 2024-05-21 ENCOUNTER — Encounter: Payer: Self-pay | Admitting: Family Medicine

## 2024-05-21 VITALS — BP 133/84 | HR 77 | Resp 16 | Ht 62.0 in | Wt 234.0 lb

## 2024-05-21 DIAGNOSIS — Z23 Encounter for immunization: Secondary | ICD-10-CM

## 2024-05-21 DIAGNOSIS — E559 Vitamin D deficiency, unspecified: Secondary | ICD-10-CM | POA: Diagnosis not present

## 2024-05-21 DIAGNOSIS — I10 Essential (primary) hypertension: Secondary | ICD-10-CM

## 2024-05-21 DIAGNOSIS — R7303 Prediabetes: Secondary | ICD-10-CM

## 2024-05-21 DIAGNOSIS — E7800 Pure hypercholesterolemia, unspecified: Secondary | ICD-10-CM

## 2024-05-21 DIAGNOSIS — E790 Hyperuricemia without signs of inflammatory arthritis and tophaceous disease: Secondary | ICD-10-CM

## 2024-05-21 MED ORDER — TIRZEPATIDE-WEIGHT MANAGEMENT 2.5 MG/0.5ML ~~LOC~~ SOLN
2.5000 mg | SUBCUTANEOUS | 0 refills | Status: DC
Start: 2024-05-21 — End: 2024-07-28

## 2024-05-21 NOTE — Patient Instructions (Addendum)
 F/U in November   hep B#1 today   Nurse viits in 2 and 6 months for Hep B #2 and 3  Referral for bariatric surgery  Zepbound / wegovy  will be prescribed, let me know msg me so I know if covered   It is important that you exercise regularly at least 30 minutes 5 times a week. If you develop chest pain, have severe difficulty breathing, or feel very tired, stop exercising immediately and seek medical attention   Think about what you will eat, plan ahead. Choose  clean, green, fresh or frozen over canned, processed or packaged foods which are more sugary, salty and fatty. 70 to 75% of food eaten should be vegetables and fruit. Three meals at set times with snacks allowed between meals, but they must be fruit or vegetables. Aim to eat over a 12 hour period , example 7 am to 7 pm, and STOP after  your last meal of the day. Drink water,generally about 64 ounces per day, no other drink is as healthy. Fruit juice is best enjoyed in a healthy way, by EATING the fruit.    Cmp and EGFr, HBA1C, lipid, TSH, Vit D, uric acid  today  Thanks for choosing [facility], we consider it a privelige to serve you.

## 2024-05-22 ENCOUNTER — Ambulatory Visit: Payer: Self-pay | Admitting: Family Medicine

## 2024-05-22 LAB — CMP14+EGFR
ALT: 17 IU/L (ref 0–32)
AST: 17 IU/L (ref 0–40)
Albumin: 4.2 g/dL (ref 3.9–4.9)
Alkaline Phosphatase: 93 IU/L (ref 44–121)
BUN/Creatinine Ratio: 15 (ref 9–23)
BUN: 17 mg/dL (ref 6–24)
Bilirubin Total: 0.4 mg/dL (ref 0.0–1.2)
CO2: 24 mmol/L (ref 20–29)
Calcium: 9.9 mg/dL (ref 8.7–10.2)
Chloride: 103 mmol/L (ref 96–106)
Creatinine, Ser: 1.12 mg/dL — ABNORMAL HIGH (ref 0.57–1.00)
Globulin, Total: 2.7 g/dL (ref 1.5–4.5)
Glucose: 77 mg/dL (ref 70–99)
Potassium: 3.9 mmol/L (ref 3.5–5.2)
Sodium: 141 mmol/L (ref 134–144)
Total Protein: 6.9 g/dL (ref 6.0–8.5)
eGFR: 61 mL/min/1.73 (ref >59)

## 2024-05-22 LAB — HEMOGLOBIN A1C
Est. average glucose Bld gHb Est-mCnc: 128 mg/dL
Hgb A1c MFr Bld: 6.1 % — ABNORMAL HIGH (ref 4.8–5.6)

## 2024-05-22 LAB — VITAMIN D 25 HYDROXY (VIT D DEFICIENCY, FRACTURES): Vit D, 25-Hydroxy: 35.5 ng/mL (ref 30.0–100.0)

## 2024-05-22 LAB — TSH: TSH: 1.31 u[IU]/mL (ref 0.450–4.500)

## 2024-05-22 LAB — LIPID PANEL
Chol/HDL Ratio: 3.2 ratio (ref 0.0–4.4)
Cholesterol, Total: 141 mg/dL (ref 100–199)
HDL: 44 mg/dL (ref >39)
LDL Chol Calc (NIH): 81 mg/dL (ref 0–99)
Triglycerides: 83 mg/dL (ref 0–149)
VLDL Cholesterol Cal: 16 mg/dL (ref 5–40)

## 2024-05-22 LAB — URIC ACID: Uric Acid: 3.5 mg/dL (ref 2.6–6.2)

## 2024-05-22 NOTE — Assessment & Plan Note (Signed)
 Controlled, no change in medication

## 2024-05-22 NOTE — Assessment & Plan Note (Signed)
 Hyperlipidemia:Low fat diet discussed and encouraged.   Lipid Panel  Lab Results  Component Value Date   CHOL 141 05/21/2024   HDL 44 05/21/2024   LDLCALC 81 05/21/2024   TRIG 83 05/21/2024   CHOLHDL 3.2 05/21/2024     Controlled, no change in medication

## 2024-05-22 NOTE — Progress Notes (Signed)
 [name]     MRN: [id number]      DOB: [birth year]  Chief Complaint  Patient presents with   Hypertension    Follow up    Gout    States her gout has gotten worse since changing allopurinol  to 3 times weekly     HPI [name] is here for follow up and re-evaluation of chronic medical conditions, medication management and review of any available recent lab and radiology data.  Preventive health is updated, specifically  Cancer screening and Immunization.   Questions or concerns regarding consultations or procedures which the PT has had in the interim are  addressed. Complaint/ concern as above C/o inability to lose weight , medications not covered , interested in rept surgery   ROS Denies recent fever or chills. Denies sinus pressure, nasal congestion, ear pain or sore throat. Denies chest congestion, productive cough or wheezing. Denies chest pains, palpitations and leg swelling Denies abdominal pain, nausea, vomiting,diarrhea or constipation.   Denies dysuria, frequency, hesitancy or incontinence. Denies joint pain, swelling and limitation in mobility. Denies headaches, seizures, numbness, or tingling. Denies depression, anxiety or insomnia. Denies skin break down or rash.   PE  BP 133/84   Pulse 77   Resp 16   Ht 5' 2 (1.575 m)   Wt 234 lb 0.6 oz (106.2 kg)   SpO2 96%   BMI 42.81 kg/m   Patient alert and oriented and in no cardiopulmonary distress.  HEENT: No facial asymmetry, EOMI,     Neck supple .  Chest: Clear to auscultation bilaterally.  CVS: S1, S2 no murmurs, no S3.Regular rate.  ABD: Soft non tender.   Ext: No edema  MS: Adequate ROM spine, shoulders, hips and knees.  Skin: Intact, no ulcerations or rash noted.  Psych: Good eye contact, normal affect. Memory intact not anxious or depressed appearing.  CNS: CN 2-12 intact, power,  normal throughout.no focal deficits noted.   Assessment & Plan  Essential  hypertension Controlled, no change in medication DASH diet and commitment to daily physical activity for a minimum of 30 minutes discussed and encouraged, as a part of hypertension management. The importance of attaining a healthy weight is also discussed.     05/21/2024    3:21 PM 01/16/2024    8:53 AM 09/18/2023    9:09 AM 09/18/2023    9:01 AM 08/14/2023    8:49 AM 08/14/2023    8:14 AM 03/19/2023    2:02 PM  BP/Weight  Systolic BP 133 122 128 113 120 119 120  Diastolic BP 84 83 82 75 82 79 84  Wt. (Lbs) 234.04 226.4  227  222.04   BMI 42.81 kg/m2 41.41 kg/m2  40.21 kg/m2  39.33 kg/m2        Encounter for immunization After obtaining informed consent, the Hep B #1  vaccine is  administered , with no adverse effect noted at the time of administration.   Hyperlipemia Hyperlipidemia:Low fat diet discussed and encouraged.   Lipid Panel  Lab Results  Component Value Date   CHOL 141 05/21/2024   HDL 44 05/21/2024   LDLCALC 81 05/21/2024   TRIG 83 05/21/2024   CHOLHDL 3.2 05/21/2024     Controlled, no change in medication   Immunization due After obtaining informed consent, the  Hep B #1 vaccine is  administered , with no adverse effect noted at the time of administration.   Hyperuricemia Controlled, no change in medication   MORBID OBESITY Deteriorated  Patient re-educated about  the importance of commitment to a  minimum of 150 minutes of exercise per week as able.  The importance of healthy food choices with portion control discussed, as well as eating regularly and within a 12 hour window most days. The need to choose clean , green food 50 to 75% of the time is discussed, as well as to make water the primary drink and set a goal of 64 ounces water daily.       05/21/2024    3:21 PM 01/16/2024    8:53 AM 09/18/2023    9:01 AM  Weight /BMI  Weight 234 lb 0.6 oz 226 lb 6.4 oz 227 lb  Height 5' 2 (1.575 m) 5' 2 (1.575 m) 5' 3 (1.6 m)  BMI 42.81  kg/m2 41.41 kg/m2 40.21 kg/m2    Zepbound  prescribed , will see if covered , also refer to Bariatric

## 2024-05-22 NOTE — Assessment & Plan Note (Signed)
 Patient educated about the importance of limiting  Carbohydrate intake , the need to commit to daily physical activity for a minimum of 30 minutes , and to commit weight loss. The fact that changes in all these areas will reduce or eliminate all together the development of diabetes is stressed.      Latest Ref Rng & Units 05/21/2024    4:04 PM 01/16/2024   10:03 AM 09/18/2023    8:50 AM 08/11/2023    8:26 AM 03/17/2023    8:24 AM  Diabetic Labs  HbA1c 4.8 - 5.6 % 6.1  6.2   6.4  6.2   Chol 100 - 199 mg/dL 858  864   853    HDL >60 mg/dL 44  41   42    Calc LDL 0 - 99 mg/dL 81  79   84    Triglycerides 0 - 149 mg/dL 83  72   892    Creatinine 0.57 - 1.00 mg/dL 8.87  8.84  8.75  8.63  1.07       05/21/2024    3:21 PM 01/16/2024    8:53 AM 09/18/2023    9:09 AM 09/18/2023    9:01 AM 08/14/2023    8:49 AM 08/14/2023    8:14 AM 03/19/2023    2:02 PM  BP/Weight  Systolic BP 133 122 128 113 120 119 120  Diastolic BP 84 83 82 75 82 79 84  Wt. (Lbs) 234.04 226.4  227  222.04   BMI 42.81 kg/m2 41.41 kg/m2  40.21 kg/m2  39.33 kg/m2        No data to display          Improved

## 2024-05-22 NOTE — Assessment & Plan Note (Signed)
 Deteriorated   Patient re-educated about  the importance of commitment to a  minimum of 150 minutes of exercise per week as able.  The importance of healthy food choices with portion control discussed, as well as eating regularly and within a 12 hour window most days. The need to choose clean , green food 50 to 75% of the time is discussed, as well as to make water the primary drink and set a goal of 64 ounces water daily.       05/21/2024    3:21 PM 01/16/2024    8:53 AM 09/18/2023    9:01 AM  Weight /BMI  Weight 234 lb 0.6 oz 226 lb 6.4 oz 227 lb  Height 5' 2 (1.575 m) 5' 2 (1.575 m) 5' 3 (1.6 m)  BMI 42.81 kg/m2 41.41 kg/m2 40.21 kg/m2    Zepbound  prescribed , will see if covered , also refer to Bariatric

## 2024-05-22 NOTE — Assessment & Plan Note (Signed)
 Controlled, no change in medication DASH diet and commitment to daily physical activity for a minimum of 30 minutes discussed and encouraged, as a part of hypertension management. The importance of attaining a healthy weight is also discussed.     05/21/2024    3:21 PM 01/16/2024    8:53 AM 09/18/2023    9:09 AM 09/18/2023    9:01 AM 08/14/2023    8:49 AM 08/14/2023    8:14 AM 03/19/2023    2:02 PM  BP/Weight  Systolic BP 133 122 128 113 120 119 120  Diastolic BP 84 83 82 75 82 79 84  Wt. (Lbs) 234.04 226.4  227  222.04   BMI 42.81 kg/m2 41.41 kg/m2  40.21 kg/m2  39.33 kg/m2

## 2024-05-22 NOTE — Assessment & Plan Note (Signed)
 After obtaining informed consent, the Hep B #1 vaccine is  administered , with no adverse effect noted at the time of administration.

## 2024-05-25 ENCOUNTER — Encounter: Payer: Self-pay | Admitting: Family Medicine

## 2024-05-28 ENCOUNTER — Other Ambulatory Visit (HOSPITAL_COMMUNITY): Payer: Self-pay

## 2024-05-28 ENCOUNTER — Telehealth: Payer: Self-pay | Admitting: Pharmacy Technician

## 2024-05-28 NOTE — Telephone Encounter (Signed)
 Pharmacy Patient Advocate Encounter   Received notification from CoverMyMeds that prior authorization for Zepbound  2.5MG /0.5ML pen-injectors is required/requested.   Insurance verification completed.   The patient is insured through [employer] [place]  .   Per test claim: NOT COVERED    PT ALSO HAS INSURANCE THRU [employer]. MEDICATION IS COVERED HOWEVER COPAY IS $1,035.19. PT CAN CONTACT HER HEALTH PLAN WITH ANY QUESTIONS REGARDING COPAYMENT,DEDUCTIBLE, OR COINSURANCE AMOUNTS.

## 2024-06-08 ENCOUNTER — Ambulatory Visit: Attending: Student | Admitting: Student

## 2024-06-08 ENCOUNTER — Encounter: Payer: Self-pay | Admitting: Student

## 2024-06-08 VITALS — BP 132/78 | HR 90 | Ht 63.0 in | Wt 232.2 lb

## 2024-06-08 DIAGNOSIS — E782 Mixed hyperlipidemia: Secondary | ICD-10-CM | POA: Diagnosis not present

## 2024-06-08 DIAGNOSIS — I10 Essential (primary) hypertension: Secondary | ICD-10-CM

## 2024-06-08 DIAGNOSIS — Z87898 Personal history of other specified conditions: Secondary | ICD-10-CM

## 2024-06-08 NOTE — Patient Instructions (Addendum)

## 2024-06-08 NOTE — Progress Notes (Signed)
 Cardiology Office Note    Date:  06/08/2024  ID:  [name], DOB Jul 29, 1976, MRN [id number] Cardiologist: Maude Emmer, MD Cardiology APP:  Johnson Laymon HERO, PA-C { : History of Present Illness:    [name] is a 48 y.o. female with past medical history of atypical chest pain, palpitations, HTN, HLD and prediabetes who presents to the office today for annual follow-up.  She was last examined by Tamara Lunch, NP in 12/2022 and had recently been evaluated in the ED for dizziness in the setting of significant hypokalemia. At the time of follow-up, she denied any recurrent dizziness and was overall doing well. She denied any recent anginal symptoms. No changes were made to her medication regimen and she was continued on Amlodipine  7.5 mg daily, Crestor  40 mg daily and Triamterene -HCTZ 75-50 mg daily.  In talking with the patient today, she reports overall doing well since her last office visit. She does not exercise routinely but remains active at baseline. She denies any recent chest pain or dyspnea on exertion with routine chores. No recent orthopnea, PND or pitting edema. She denies any recent palpitations and says these were previously occurring in the setting of stress several years ago but no recurrence. 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   Studies Reviewed:   EKG: EKG is ordered today and demonstrates:   EKG Interpretation Date/Time:  Tuesday June 08 2024 14:09:16 EDT Ventricular Rate:  87 PR Interval:  154 QRS Duration:  80 QT Interval:  362 QTC Calculation: 435 R Axis:   11  Text Interpretation: Normal sinus rhythm Normal ECG When compared with ECG of 26-Nov-2022 17:05, PREVIOUS ECG IS PRESENT Confirmed by Johnson Laymon ([id number]) on 06/08/2024 2:13:50 PM       Echocardiogram: 2015 Study Conclusions   - Left ventricle: The cavity size was normal. Wall thickness was    normal. Systolic  function was normal. The estimated ejection    fraction was in the range of 55% to 60%. Wall motion was normal;    there were no regional wall motion abnormalities. Left    ventricular diastolic function parameters were normal.  - Mitral valve: There was mild regurgitation.    Physical Exam:   VS:  BP 132/78   Pulse 90   Ht 5' 3 (1.6 m)   Wt 232 lb 3.2 oz (105.3 kg)   SpO2 95%   BMI 41.13 kg/m    Wt Readings from Last 3 Encounters:  06/08/24 232 lb 3.2 oz (105.3 kg)  05/21/24 234 lb 0.6 oz (106.2 kg)  01/16/24 226 lb 6.4 oz (102.7 kg)     GEN: Pleasant female appearing in no acute distress NECK: No JVD; No carotid bruits CARDIAC: RRR, no murmurs, rubs, gallops RESPIRATORY:  Clear to auscultation without rales, wheezing or rhonchi  ABDOMEN: Appears non-distended. No obvious abdominal masses. EXTREMITIES: No clubbing or cyanosis. No pitting edema.  Distal pedal pulses are 2+ bilaterally.   Assessment and Plan:   1. Essential hypertension - BP was initially at 140/100, improved to 132/78 on recheck during today's visit. Continue current medical therapy with Amlodipine  10 mg daily and Triamterene -HCTZ 37.5-25 mg daily. Labs in 04/2024 showed her creatinine was stable at 1.12 with K+ at 3.9.  2. Mixed hyperlipidemia - FLP in 04/2024 showed total cholesterol 141, triglycerides 83, HDL 44 and LDL 81. LFT's WNL. Continue current medical therapy with Crestor  40 mg daily.  3. History of palpitations - Symptoms previously occurred in the setting of stress several years ago but have now resolved. She continues to limit caffeine intake and mostly consumes water. She is in normal sinus rhythm by EKG today. No indication for a cardiac monitor at this time.  Signed, Laymon CHRISTELLA Qua, PA-C

## 2024-06-18 ENCOUNTER — Encounter: Payer: Self-pay | Admitting: Radiology

## 2024-07-13 ENCOUNTER — Other Ambulatory Visit: Payer: Self-pay

## 2024-07-13 ENCOUNTER — Encounter: Payer: Self-pay | Admitting: Emergency Medicine

## 2024-07-13 ENCOUNTER — Ambulatory Visit
Admission: EM | Admit: 2024-07-13 | Discharge: 2024-07-13 | Disposition: A | Discharge status: Home / Self Care | Attending: Family Medicine | Admitting: Family Medicine

## 2024-07-13 DIAGNOSIS — S29012A Strain of muscle and tendon of back wall of thorax, initial encounter: Secondary | ICD-10-CM

## 2024-07-13 DIAGNOSIS — R52 Pain, unspecified: Secondary | ICD-10-CM

## 2024-07-13 MED ORDER — TIZANIDINE HCL 4 MG PO CAPS: 4.0000 mg | ORAL_CAPSULE | Freq: Three times a day (TID) | ORAL | 0 refills | Status: DC | PRN
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
## 2024-07-23 ENCOUNTER — Ambulatory Visit (INDEPENDENT_AMBULATORY_CARE_PROVIDER_SITE_OTHER)

## 2024-07-23 DIAGNOSIS — Z23 Encounter for immunization: Secondary | ICD-10-CM | POA: Diagnosis not present

## 2024-07-23 NOTE — Progress Notes (Signed)
 Patient is in office today for a nurse visit for Immunization. Patient Injection was given in the  Right deltoid. Patient tolerated injection well.

## 2024-07-27 ENCOUNTER — Ambulatory Visit: Payer: Self-pay
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
## 2024-07-27 NOTE — Telephone Encounter (Signed)
 Appt made.

## 2024-07-28 ENCOUNTER — Encounter: Payer: Self-pay | Admitting: Family Medicine

## 2024-07-28 ENCOUNTER — Ambulatory Visit (INDEPENDENT_AMBULATORY_CARE_PROVIDER_SITE_OTHER): Admitting: Family Medicine

## 2024-07-28 VITALS — BP 127/85 | HR 84 | Temp 98.5°F | Ht 63.0 in | Wt 235.8 lb

## 2024-07-28 DIAGNOSIS — M546 Pain in thoracic spine: Secondary | ICD-10-CM

## 2024-07-28 MED ORDER — METHOCARBAMOL 500 MG PO TABS: 500.0000 mg | ORAL_TABLET | Freq: Three times a day (TID) | ORAL | 0 refills | Status: AC
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
## 2024-08-30 ENCOUNTER — Encounter: Payer: Self-pay | Admitting: Radiology

## 2024-08-31 ENCOUNTER — Ambulatory Visit (HOSPITAL_COMMUNITY): Attending: Family Medicine

## 2024-08-31 ENCOUNTER — Encounter (HOSPITAL_COMMUNITY): Payer: Self-pay

## 2024-08-31 ENCOUNTER — Other Ambulatory Visit: Payer: Self-pay

## 2024-08-31 DIAGNOSIS — Z7409 Other reduced mobility: Secondary | ICD-10-CM | POA: Insufficient documentation

## 2024-08-31 DIAGNOSIS — M546 Pain in thoracic spine: Secondary | ICD-10-CM | POA: Insufficient documentation

## 2024-08-31 DIAGNOSIS — M545 Low back pain, unspecified: Secondary | ICD-10-CM | POA: Insufficient documentation
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
## 2024-09-02 ENCOUNTER — Ambulatory Visit (HOSPITAL_COMMUNITY)

## 2024-09-02 ENCOUNTER — Encounter (HOSPITAL_COMMUNITY): Payer: Self-pay

## 2024-09-02 DIAGNOSIS — M546 Pain in thoracic spine: Secondary | ICD-10-CM | POA: Diagnosis not present

## 2024-09-02 DIAGNOSIS — M545 Low back pain, unspecified: Secondary | ICD-10-CM

## 2024-09-02 DIAGNOSIS — Z7409 Other reduced mobility: Secondary | ICD-10-CM | POA: Diagnosis not present
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
## 2024-09-06 ENCOUNTER — Ambulatory Visit (HOSPITAL_COMMUNITY)

## 2024-09-06 ENCOUNTER — Encounter (HOSPITAL_COMMUNITY): Payer: Self-pay

## 2024-09-06 DIAGNOSIS — M546 Pain in thoracic spine: Secondary | ICD-10-CM

## 2024-09-06 DIAGNOSIS — M545 Low back pain, unspecified: Secondary | ICD-10-CM | POA: Diagnosis not present

## 2024-09-06 DIAGNOSIS — Z7409 Other reduced mobility: Secondary | ICD-10-CM | POA: Diagnosis not present
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
## 2024-09-09 ENCOUNTER — Ambulatory Visit (HOSPITAL_COMMUNITY)

## 2024-09-09 ENCOUNTER — Encounter (HOSPITAL_COMMUNITY): Payer: Self-pay

## 2024-09-09 DIAGNOSIS — Z7409 Other reduced mobility: Secondary | ICD-10-CM

## 2024-09-09 DIAGNOSIS — M546 Pain in thoracic spine: Secondary | ICD-10-CM | POA: Diagnosis not present

## 2024-09-09 DIAGNOSIS — M545 Low back pain, unspecified: Secondary | ICD-10-CM | POA: Diagnosis not present
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
## 2024-09-13 ENCOUNTER — Ambulatory Visit (HOSPITAL_COMMUNITY)

## 2024-09-13 ENCOUNTER — Encounter (HOSPITAL_COMMUNITY): Payer: Self-pay

## 2024-09-13 ENCOUNTER — Encounter: Payer: Self-pay | Admitting: Family Medicine

## 2024-09-13 DIAGNOSIS — Z7409 Other reduced mobility: Secondary | ICD-10-CM | POA: Diagnosis not present

## 2024-09-13 DIAGNOSIS — M545 Low back pain, unspecified: Secondary | ICD-10-CM

## 2024-09-13 DIAGNOSIS — M546 Pain in thoracic spine: Secondary | ICD-10-CM | POA: Diagnosis not present
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
## 2024-09-14 ENCOUNTER — Other Ambulatory Visit: Payer: Self-pay

## 2024-09-14 MED ORDER — AMLODIPINE BESYLATE 10 MG PO TABS: 10.0000 mg | ORAL_TABLET | Freq: Every day | ORAL | 0 refills | Status: AC

## 2024-09-15 ENCOUNTER — Ambulatory Visit: Admitting: Family Medicine

## 2024-09-15 ENCOUNTER — Ambulatory Visit: Payer: Self-pay | Admitting: Family Medicine

## 2024-09-16 ENCOUNTER — Ambulatory Visit (HOSPITAL_COMMUNITY): Admitting: Physical Therapy

## 2024-09-28 ENCOUNTER — Ambulatory Visit (HOSPITAL_COMMUNITY)

## 2024-09-28 ENCOUNTER — Encounter (HOSPITAL_COMMUNITY): Payer: Self-pay

## 2024-09-28 DIAGNOSIS — M545 Low back pain, unspecified: Secondary | ICD-10-CM | POA: Diagnosis present

## 2024-09-28 DIAGNOSIS — M546 Pain in thoracic spine: Secondary | ICD-10-CM | POA: Diagnosis present

## 2024-09-28 DIAGNOSIS — Z7409 Other reduced mobility: Secondary | ICD-10-CM | POA: Insufficient documentation
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
## 2024-09-30 ENCOUNTER — Ambulatory Visit (HOSPITAL_COMMUNITY): Admitting: Physical Therapy

## 2024-09-30 DIAGNOSIS — M545 Low back pain, unspecified: Secondary | ICD-10-CM

## 2024-09-30 DIAGNOSIS — Z7409 Other reduced mobility: Secondary | ICD-10-CM

## 2024-09-30 DIAGNOSIS — M546 Pain in thoracic spine: Secondary | ICD-10-CM
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
## 2024-10-11 ENCOUNTER — Ambulatory Visit (HOSPITAL_COMMUNITY): Admitting: Physical Therapy

## 2024-10-11 DIAGNOSIS — M546 Pain in thoracic spine: Secondary | ICD-10-CM

## 2024-10-11 DIAGNOSIS — M545 Low back pain, unspecified: Secondary | ICD-10-CM | POA: Diagnosis not present

## 2024-10-11 DIAGNOSIS — Z7409 Other reduced mobility: Secondary | ICD-10-CM
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
## 2024-10-18 ENCOUNTER — Ambulatory Visit (HOSPITAL_COMMUNITY)

## 2024-10-22 ENCOUNTER — Ambulatory Visit

## 2024-10-25 ENCOUNTER — Ambulatory Visit (HOSPITAL_COMMUNITY)

## 2024-11-22 ENCOUNTER — Ambulatory Visit: Payer: Self-pay

## 2024-11-26 ENCOUNTER — Ambulatory Visit

## 2024-12-03 ENCOUNTER — Ambulatory Visit: Admitting: Family Medicine

## 2024-12-27 ENCOUNTER — Ambulatory Visit: Admitting: Family Medicine
